# Patient Record
Sex: Female | Born: 1950 | Race: White | Hispanic: No | Smoking: Current every day smoker
Health system: Southern US, Community
[De-identification: ages and names within clinical notes are randomized; demographics above are authoritative.]

## PROBLEM LIST (undated history)

## (undated) DIAGNOSIS — E039 Hypothyroidism, unspecified: Secondary | ICD-10-CM

## (undated) DIAGNOSIS — E782 Mixed hyperlipidemia: Secondary | ICD-10-CM

## (undated) DIAGNOSIS — F419 Anxiety disorder, unspecified: Secondary | ICD-10-CM

## (undated) DIAGNOSIS — E6609 Other obesity due to excess calories: Secondary | ICD-10-CM

## (undated) DIAGNOSIS — F4321 Adjustment disorder with depressed mood: Secondary | ICD-10-CM

## (undated) DIAGNOSIS — F17210 Nicotine dependence, cigarettes, uncomplicated: Secondary | ICD-10-CM

## (undated) DIAGNOSIS — M5135 Other intervertebral disc degeneration, thoracolumbar region: Secondary | ICD-10-CM

## (undated) DIAGNOSIS — J449 Chronic obstructive pulmonary disease, unspecified: Secondary | ICD-10-CM

## (undated) DIAGNOSIS — J301 Allergic rhinitis due to pollen: Secondary | ICD-10-CM

## (undated) DIAGNOSIS — E559 Vitamin D deficiency, unspecified: Secondary | ICD-10-CM

## (undated) DIAGNOSIS — M5033 Other cervical disc degeneration, cervicothoracic region: Secondary | ICD-10-CM

## (undated) DIAGNOSIS — B351 Tinea unguium: Secondary | ICD-10-CM

## (undated) HISTORY — DX: Other intervertebral disc degeneration, thoracolumbar region: M51.35

## (undated) HISTORY — DX: Mixed hyperlipidemia: E78.2

## (undated) HISTORY — PX: CERVICAL DISC SURGERY: SHX588

## (undated) HISTORY — DX: Hypothyroidism, unspecified: E03.9

## (undated) HISTORY — DX: Other obesity due to excess calories: E66.09

## (undated) HISTORY — DX: Anxiety disorder, unspecified: F41.9

## (undated) HISTORY — DX: Allergic rhinitis due to pollen: J30.1

## (undated) HISTORY — DX: Adjustment disorder with depressed mood: F43.21

## (undated) HISTORY — DX: Vitamin D deficiency, unspecified: E55.9

## (undated) HISTORY — DX: Chronic obstructive pulmonary disease, unspecified: J44.9

## (undated) HISTORY — DX: Nicotine dependence, cigarettes, uncomplicated: F17.210

## (undated) HISTORY — DX: Other cervical disc degeneration, cervicothoracic region: M50.33

## (undated) HISTORY — DX: Tinea unguium: B35.1

## (undated) HISTORY — PX: BACK SURGERY: SHX140

---

## 2004-10-18 ENCOUNTER — Emergency Department (HOSPITAL_COMMUNITY): Admission: EM | Admit: 2004-10-18 | Discharge: 2004-10-18 | Payer: Self-pay | Admitting: Emergency Medicine

## 2006-05-09 HISTORY — PX: COLONOSCOPY: SHX174

## 2006-07-10 ENCOUNTER — Ambulatory Visit (HOSPITAL_COMMUNITY): Admission: RE | Admit: 2006-07-10 | Discharge: 2006-07-10 | Payer: Self-pay | Admitting: Family Medicine

## 2006-09-27 ENCOUNTER — Ambulatory Visit (HOSPITAL_COMMUNITY): Admission: RE | Admit: 2006-09-27 | Discharge: 2006-09-27 | Payer: Self-pay | Admitting: Family Medicine

## 2007-03-29 ENCOUNTER — Ambulatory Visit: Payer: Self-pay | Admitting: Gastroenterology

## 2007-04-12 ENCOUNTER — Ambulatory Visit: Payer: Self-pay | Admitting: Gastroenterology

## 2007-04-12 ENCOUNTER — Ambulatory Visit (HOSPITAL_COMMUNITY): Admission: RE | Admit: 2007-04-12 | Discharge: 2007-04-12 | Payer: Self-pay | Admitting: Gastroenterology

## 2007-05-07 ENCOUNTER — Emergency Department (HOSPITAL_COMMUNITY): Admission: EM | Admit: 2007-05-07 | Discharge: 2007-05-07 | Payer: Self-pay | Admitting: Family Medicine

## 2007-05-09 ENCOUNTER — Ambulatory Visit (HOSPITAL_COMMUNITY): Admission: RE | Admit: 2007-05-09 | Discharge: 2007-05-09 | Payer: Self-pay | Admitting: Preventative Medicine

## 2007-05-31 ENCOUNTER — Encounter: Admission: RE | Admit: 2007-05-31 | Discharge: 2007-05-31 | Payer: Self-pay | Admitting: Neurosurgery

## 2009-05-25 ENCOUNTER — Ambulatory Visit (HOSPITAL_COMMUNITY): Admission: RE | Admit: 2009-05-25 | Discharge: 2009-05-25 | Payer: Self-pay | Admitting: Family Medicine

## 2009-05-25 DIAGNOSIS — F17210 Nicotine dependence, cigarettes, uncomplicated: Secondary | ICD-10-CM | POA: Diagnosis present

## 2009-05-25 HISTORY — DX: Nicotine dependence, cigarettes, uncomplicated: F17.210

## 2009-09-08 DIAGNOSIS — J301 Allergic rhinitis due to pollen: Secondary | ICD-10-CM

## 2009-09-08 DIAGNOSIS — F4321 Adjustment disorder with depressed mood: Secondary | ICD-10-CM

## 2009-09-08 HISTORY — DX: Adjustment disorder with depressed mood: F43.21

## 2009-09-08 HISTORY — DX: Allergic rhinitis due to pollen: J30.1

## 2009-09-14 ENCOUNTER — Ambulatory Visit (HOSPITAL_COMMUNITY): Admission: RE | Admit: 2009-09-14 | Discharge: 2009-09-14 | Payer: Self-pay | Admitting: Family Medicine

## 2009-11-05 ENCOUNTER — Ambulatory Visit (HOSPITAL_COMMUNITY): Admission: RE | Admit: 2009-11-05 | Discharge: 2009-11-05 | Payer: Self-pay | Admitting: Neurosurgery

## 2009-12-28 DIAGNOSIS — E559 Vitamin D deficiency, unspecified: Secondary | ICD-10-CM

## 2009-12-28 HISTORY — DX: Vitamin D deficiency, unspecified: E55.9

## 2010-01-14 ENCOUNTER — Inpatient Hospital Stay (HOSPITAL_COMMUNITY): Admission: RE | Admit: 2010-01-14 | Discharge: 2010-01-17 | Payer: Self-pay | Admitting: Neurosurgery

## 2010-01-31 ENCOUNTER — Emergency Department (HOSPITAL_COMMUNITY): Admission: EM | Admit: 2010-01-31 | Discharge: 2010-01-31 | Payer: Self-pay | Admitting: Emergency Medicine

## 2010-07-22 LAB — CBC
HCT: 44.8 % (ref 36.0–46.0)
Hemoglobin: 15.5 g/dL — ABNORMAL HIGH (ref 12.0–15.0)
MCH: 31.1 pg (ref 26.0–34.0)
MCHC: 34.6 g/dL (ref 30.0–36.0)
MCV: 90 fL (ref 78.0–100.0)
RDW: 13 % (ref 11.5–15.5)

## 2010-07-22 LAB — ABO/RH: ABO/RH(D): A NEG

## 2010-07-22 LAB — BASIC METABOLIC PANEL
BUN: 9 mg/dL (ref 6–23)
CO2: 33 mEq/L — ABNORMAL HIGH (ref 19–32)
Calcium: 9.3 mg/dL (ref 8.4–10.5)
Chloride: 102 mEq/L (ref 96–112)
Creatinine, Ser: 0.99 mg/dL (ref 0.4–1.2)
Potassium: 4.4 mEq/L (ref 3.5–5.1)

## 2010-07-22 LAB — TYPE AND SCREEN: Antibody Screen: NEGATIVE

## 2010-07-23 ENCOUNTER — Encounter (HOSPITAL_COMMUNITY)
Admission: RE | Admit: 2010-07-23 | Discharge: 2010-07-23 | Disposition: A | Discharge status: Home / Self Care | Payer: BC Managed Care – PPO | Source: Ambulatory Visit | Attending: Neurosurgery | Admitting: Neurosurgery

## 2010-07-29 ENCOUNTER — Ambulatory Visit (HOSPITAL_COMMUNITY): Admission: RE | Admit: 2010-07-29 | Payer: BC Managed Care – PPO | Source: Ambulatory Visit | Admitting: Neurosurgery

## 2010-09-21 NOTE — Consult Note (Signed)
NAMEELISABELLA, Lori Lopez              ACCOUNT NO.:  [id number]   MEDICAL RECORD NO.:  [id number]          PATIENT TYPE:  AMB   LOCATION:  DAY                           FACILITY:  [facility]   PHYSICIAN:  Kassie Mends, M.D.      DATE OF BIRTH:  1950-11-20   DATE OF CONSULTATION:  DATE OF DISCHARGE:                                 CONSULTATION   REQUESTING PHYSICIAN:  Lori Lopez, M.D.   REASON FOR CONSULTATION:  Needs screening colonoscopy.   HPI:  Lori Lopez is a 60 year old Caucasian female who has never had  a colonoscopy.  She denies any GI complaints at this time except for a  bulge from her rectum with defecation has been present for the last 8  months.  She does have history of hemorrhoids and has had previous  treatment years ago.  She denies any rectal bleeding or melena.  Denies  any constipation or diarrhea.  Denies any abdominal pain.  Denies any  upper GI symptoms at this time.   PAST MEDICAL AND SURGICAL HISTORY:  1. Hemorrhoids.  2. Partial hysterectomy.  3. She had cholecystectomy in 2003 secondary to  cholelithiasis.  4. She has had cervical disk surgery.   CURRENT MEDICINES:  Alprazolam 0.5 mg p.r.n., calcium and vitamin D once  daily, vitamin E once daily.   ALLERGIES:  NO KNOWN DRUG ALLERGIES.   FAMILY HISTORY:  No known family history of colorectal carcinoma, liver  or chronic GI problems.  Mother age 57 and father age 21 are healthy.  She has 4 healthy siblings.   SOCIAL HISTORY:  Lori Lopez is married.  She has 3 healthy children.  She is employed full time.  She has a 40 pack-year history of tobacco  use.  No alcohol or drug use.   REVIEW OF SYSTEMS:  See HPI, otherwise negative.   PHYSICAL EXAM:  VITAL SIGNS: Weight 167 pounds, 5 feet 2 inches, temp 98  degrees, blood pressure 128/82 and pulse 80.  GENERAL: She is a well-developed, well-nourished Caucasian female in no  acute distress.  HEENT:  sclera anicteric.  Conjunctivae pink.   Oropharynx pink and moist  without any lesions.  NECK:  Supple with no masses or thyromegaly.  CHEST:  Heart regular rhythm, normal S1-S2, without murmurs, clicks,  rubs or gallops.  Lungs clear to auscultation bilaterally.  ABDOMEN: Protuberant with positive bowel sounds x4.  No bruits  auscultated.  Soft, nondistended.  She does have a palpable 2 x 3 cm  bulge in the right upper quadrant just below the right costal margin  suspicious for hernia.  Otherwise no palpable mass or  hepatosplenomegaly.  Exam is limited given the patient's body habitus.  RECTAL:  No external lesions visualized.  Good sphincter tone.  No  internal masses palpated.  Small amount of light brown stool was  obtained from the vault which was hemoccult negative.  EXTREMITIES:  Without clubbing or edema bilaterally.   ASSESSMENT:  Lori Lopez is a 60 year old Caucasian female who is  overdue for screening colonoscopy.  She was referred through the  courtesy  of Dr. Sudie Lopez.  She has a subjective bulge with defecation.  Rectal exam was benign today, does have previous history of hemorrhoidal  disease but denies any rectal bleeding or melena at this time.  She is  going to need screening colonoscopy.   PLAN:  Screening colonoscopy with Dr. Cira Servant in the near future.  I have  discussed the procedure including risks and benefits including but not  limited to bleeding, infection, perforation, and drug reaction.  She  agrees with this plan and consent will be obtained.   We would like to thank Dr. Sudie Lopez for allowing [place] to participate in  the care of Lori Lopez.      [name], N.P.      Kassie Mends, M.D.  Electronically Signed    KJ/MEDQ  D:  03/29/2007  T:  03/30/2007  Job:  [id number]   cc:   Lori Lopez, M.D.  Fax: [phone]

## 2010-09-21 NOTE — Op Note (Signed)
Lori Lopez, Lori Lopez              ACCOUNT NO.:  [id number]   MEDICAL RECORD NO.:  [id number]          PATIENT TYPE:  AMB   LOCATION:  DAY                           FACILITY:  [facility]   PHYSICIAN:  Kassie Mends, M.D.      DATE OF BIRTH:  04/15/1951   DATE OF PROCEDURE:  04/12/2007  DATE OF DISCHARGE:                               OPERATIVE REPORT   REFERRING PHYSICIAN:  John Giovanni, MD.   PROCEDURE:  Colonoscopy.   INDICATION FOR EXAM:  Ms. Mills is a 60 year old female who presents  for average risk colon cancer screening.   FINDINGS:  1. Normal colon without evidence of polyps, masses, inflammatory      changes, diverticula or AVMs.  2. Small internal hemorrhoids.  Otherwise normal retroflexed view of      the rectum.   RECOMMENDATIONS:  1. Screening colonoscopy in 10 years.  2. She should follow high fiber diet.  She is given handout on high-      fiber diet and hemorrhoids.   MEDICATIONS:  1. Demerol 50 mg IV.  2. Versed 4 mg IV.   PROCEDURE TECHNIQUE:  Physical exam was performed.  Informed consent was  obtained from the patient after explaining benefits, risks and  alternatives to procedure.  The patient was connected to the monitor and  placed in left lateral position.  Continuous oxygen was provided by  nasal cannula and IV medicine administered through an indwelling  cannula.  After administration of sedation and rectal exam, the  patient's rectum was intubated and the  scope was advanced under direct visualization to the cecum.  The scope  was removed slowly by carefully examining the color, texture, anatomy  and integrity of the mucosa on the way out.  The patient recovered in  endoscopy and discharged home in satisfactory condition.      Kassie Mends, M.D.  Electronically Signed     SM/MEDQ  D:  04/12/2007  T:  04/12/2007  Job:  [id number]   cc:   Mila Homer. Sudie Bailey, M.D.  Fax: [phone]

## 2010-10-28 ENCOUNTER — Ambulatory Visit (HOSPITAL_COMMUNITY)
Admission: RE | Admit: 2010-10-28 | Discharge: 2010-10-28 | Disposition: A | Discharge status: Home / Self Care | Payer: BC Managed Care – PPO | Source: Ambulatory Visit | Attending: Family Medicine | Admitting: Family Medicine

## 2010-10-28 ENCOUNTER — Other Ambulatory Visit (HOSPITAL_COMMUNITY): Payer: Self-pay | Admitting: Family Medicine

## 2010-10-28 DIAGNOSIS — R0781 Pleurodynia: Secondary | ICD-10-CM

## 2010-10-28 DIAGNOSIS — R0789 Other chest pain: Secondary | ICD-10-CM | POA: Insufficient documentation

## 2010-11-05 DIAGNOSIS — J449 Chronic obstructive pulmonary disease, unspecified: Secondary | ICD-10-CM | POA: Diagnosis present

## 2010-11-05 HISTORY — DX: Chronic obstructive pulmonary disease, unspecified: J44.9

## 2010-11-26 ENCOUNTER — Other Ambulatory Visit (HOSPITAL_COMMUNITY): Payer: Self-pay | Admitting: Family Medicine

## 2010-11-26 DIAGNOSIS — M792 Neuralgia and neuritis, unspecified: Secondary | ICD-10-CM

## 2010-11-29 ENCOUNTER — Other Ambulatory Visit (HOSPITAL_COMMUNITY): Payer: Self-pay | Admitting: Family Medicine

## 2010-11-29 ENCOUNTER — Ambulatory Visit (HOSPITAL_COMMUNITY)
Admission: RE | Admit: 2010-11-29 | Discharge: 2010-11-29 | Disposition: A | Discharge status: Home / Self Care | Payer: BC Managed Care – PPO | Source: Ambulatory Visit | Attending: Family Medicine | Admitting: Family Medicine

## 2010-11-29 DIAGNOSIS — R0789 Other chest pain: Secondary | ICD-10-CM | POA: Insufficient documentation

## 2010-11-29 DIAGNOSIS — M792 Neuralgia and neuritis, unspecified: Secondary | ICD-10-CM

## 2010-11-29 DIAGNOSIS — G548 Other nerve root and plexus disorders: Secondary | ICD-10-CM | POA: Insufficient documentation

## 2010-11-29 DIAGNOSIS — R918 Other nonspecific abnormal finding of lung field: Secondary | ICD-10-CM | POA: Insufficient documentation

## 2010-11-29 MED ORDER — IOHEXOL 300 MG/ML  SOLN
100.0000 mL | Freq: Once | INTRAMUSCULAR | Status: AC | PRN
  Administered 2010-11-29: 100 mL via INTRAVENOUS

## 2010-12-21 ENCOUNTER — Other Ambulatory Visit (HOSPITAL_COMMUNITY): Payer: Self-pay | Admitting: Family Medicine

## 2010-12-21 DIAGNOSIS — Z139 Encounter for screening, unspecified: Secondary | ICD-10-CM

## 2010-12-30 ENCOUNTER — Ambulatory Visit (HOSPITAL_COMMUNITY)
Admission: RE | Admit: 2010-12-30 | Discharge: 2010-12-30 | Disposition: A | Discharge status: Home / Self Care | Payer: BC Managed Care – PPO | Source: Ambulatory Visit | Attending: Family Medicine | Admitting: Family Medicine

## 2010-12-30 DIAGNOSIS — Z1231 Encounter for screening mammogram for malignant neoplasm of breast: Secondary | ICD-10-CM | POA: Insufficient documentation

## 2010-12-30 DIAGNOSIS — Z139 Encounter for screening, unspecified: Secondary | ICD-10-CM

## 2013-05-15 DIAGNOSIS — B351 Tinea unguium: Secondary | ICD-10-CM

## 2013-05-15 DIAGNOSIS — E6609 Other obesity due to excess calories: Secondary | ICD-10-CM

## 2013-05-15 DIAGNOSIS — E782 Mixed hyperlipidemia: Secondary | ICD-10-CM

## 2013-05-15 HISTORY — DX: Tinea unguium: B35.1

## 2013-05-15 HISTORY — DX: Mixed hyperlipidemia: E78.2

## 2013-05-15 HISTORY — DX: Other obesity due to excess calories: E66.09

## 2013-06-13 ENCOUNTER — Ambulatory Visit (HOSPITAL_COMMUNITY)
Admission: RE | Admit: 2013-06-13 | Discharge: 2013-06-13 | Disposition: A | Discharge status: Home / Self Care | Payer: Medicare Other | Source: Ambulatory Visit | Attending: Family Medicine | Admitting: Family Medicine

## 2013-06-13 ENCOUNTER — Other Ambulatory Visit (HOSPITAL_COMMUNITY): Payer: Self-pay | Admitting: Family Medicine

## 2013-06-13 DIAGNOSIS — Z981 Arthrodesis status: Secondary | ICD-10-CM | POA: Insufficient documentation

## 2013-06-13 DIAGNOSIS — R52 Pain, unspecified: Secondary | ICD-10-CM

## 2013-06-13 DIAGNOSIS — M47814 Spondylosis without myelopathy or radiculopathy, thoracic region: Secondary | ICD-10-CM | POA: Insufficient documentation

## 2013-06-13 DIAGNOSIS — M5135 Other intervertebral disc degeneration, thoracolumbar region: Secondary | ICD-10-CM

## 2013-06-13 DIAGNOSIS — M5033 Other cervical disc degeneration, cervicothoracic region: Secondary | ICD-10-CM

## 2013-06-13 HISTORY — DX: Other cervical disc degeneration, cervicothoracic region: M50.33

## 2013-06-13 HISTORY — DX: Other intervertebral disc degeneration, thoracolumbar region: M51.35

## 2014-09-08 ENCOUNTER — Other Ambulatory Visit (HOSPITAL_COMMUNITY): Payer: Self-pay | Admitting: Family Medicine

## 2014-09-08 DIAGNOSIS — Z1231 Encounter for screening mammogram for malignant neoplasm of breast: Secondary | ICD-10-CM

## 2014-09-10 ENCOUNTER — Ambulatory Visit (HOSPITAL_COMMUNITY): Payer: Medicare Other

## 2014-09-29 ENCOUNTER — Ambulatory Visit (HOSPITAL_COMMUNITY)
Admission: RE | Admit: 2014-09-29 | Discharge: 2014-09-29 | Disposition: A | Discharge status: Home / Self Care | Payer: PPO | Source: Ambulatory Visit | Attending: Family Medicine | Admitting: Family Medicine

## 2014-09-29 DIAGNOSIS — Z1231 Encounter for screening mammogram for malignant neoplasm of breast: Secondary | ICD-10-CM | POA: Insufficient documentation

## 2014-10-10 ENCOUNTER — Encounter: Payer: Self-pay | Admitting: *Deleted

## 2014-10-13 ENCOUNTER — Encounter: Payer: Self-pay | Admitting: Obstetrics & Gynecology

## 2014-10-13 ENCOUNTER — Ambulatory Visit (INDEPENDENT_AMBULATORY_CARE_PROVIDER_SITE_OTHER): Payer: PPO | Admitting: Obstetrics & Gynecology

## 2014-10-13 VITALS — BP 130/80 | HR 80 | Ht 62.0 in | Wt 168.0 lb

## 2014-10-13 DIAGNOSIS — N393 Stress incontinence (female) (male): Secondary | ICD-10-CM | POA: Diagnosis not present

## 2014-10-13 MED ORDER — ESTRADIOL 0.1 MG/GM VA CREA: 1.0000 | TOPICAL_CREAM | Freq: Every day | VAGINAL | Status: DC

## 2014-10-13 MED ORDER — IMIPRAMINE HCL 25 MG PO TABS: 25.0000 mg | ORAL_TABLET | Freq: Every day | ORAL | Status: DC

## 2014-10-13 NOTE — Progress Notes (Signed)
Patient ID: Lori Lopez, female   DOB: 08-Aug-1950, 64 y.o.   MRN: [id number]    Chief Complaint  Patient presents with  . Referral    stress incontinence.c/c hurting on rt side.     HPI:    64 y.o. No obstetric history on file. No LMP recorded. Patient is postmenopausal.  Referred for SUI  Loses urine with coughing laughing straining No loss with urgency or can't make it to the bathroom History is consistent with pure SUI smoker    Current outpatient prescriptions:  .  aspirin 81 MG tablet, Take 81 mg by mouth daily., Disp: , Rfl:  .  Cholecalciferol (VITAMIN D3) 400 UNITS CAPS, Take by mouth. Takes 2 daily., Disp: , Rfl:  .  clotrimazole-betamethasone (LOTRISONE) lotion, Apply topically 2 (two) times daily., Disp: , Rfl:  .  Ibuprofen 200 MG CAPS, Take by mouth., Disp: , Rfl:  .  ALPRAZolam (XANAX) 1 MG tablet, Take 1 mg by mouth at bedtime as needed for anxiety., Disp: , Rfl:  .  diphenhydrAMINE (BENADRYL) 25 MG tablet, Take 25 mg by mouth every 4 (four) hours as needed. , Disp: , Rfl:  .  estradiol (ESTRACE VAGINAL) 0.1 MG/GM vaginal cream, Place 1 Applicatorful vaginally at bedtime., Disp: 42.5 g, Rfl: 12 .  imipramine (TOFRANIL) 25 MG tablet, Take 1 tablet (25 mg total) by mouth at bedtime., Disp: 30 tablet, Rfl: 3 .  meclizine (ANTIVERT) 25 MG tablet, Take 25 mg by mouth every 6 (six) hours as needed. , Disp: , Rfl:   Problem Pertinent ROS:       No burning with urination, frequency or urgency No nausea, vomiting or diarrhea Nor fever chills or other constitutional symptoms   Extended ROS:      [place]:              Past Medical History  Diagnosis Date  . 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 Degeneration of thoracolumbar intervertebral disc 06/13/2013  . Obesity due to excess calories 05/15/2013  . Tinea unguium 05/15/2013  . Mixed hyperlipidemia 05/15/2013  . COPD (chronic obstructive pulmonary disease) 11/05/2010  . Anxiety disorder  10/25/2010  . Vitamin D deficiency 12/28/2009  . Abnormal bone density screening 09/24/2009  . Adjustment disorder with depressed mood 09/08/2009  . Cervicalgia 09/08/2009  . Pain in thoracic spine 09/08/2009  . Allergic rhinitis due to pollen 09/08/2009  . Cigarette nicotine dependence, uncomplicated 09/10/6977  . Chest discomfort 10/10/14  . Epigastric pain   . Hypothyroidism   . Anxiety     Past Surgical History  Procedure Laterality Date  . Back surgery      OB History    No data available      Allergies  Allergen Reactions  . Hydrocodone   . Lyrica [Pregabalin]   . Shrimp [Shellfish Allergy]     History   Social History  . Marital Status: Married    Spouse Name: N/A  . Number of Children: N/A  . Years of Education: N/A   Social History Main Topics  . Smoking status: Current Some Day Smoker    Types: Cigarettes  . Smokeless tobacco: Not on file     Comment: 2-3 weekly  . Alcohol Use: Not on file  . Drug Use: Not on file  . Sexual Activity: Not on file   Other Topics Concern  . None   Social History Narrative    Family History  Problem Relation Age of Onset  . Diabetes Mother   .  CAD Mother   . Heart disease Mother   . CAD Father      Examination:  Vitals:  Blood pressure 130/80, pulse 80, height 5\' 2"  (1.575 m), weight 168 lb (76.204 kg).    Physical Examination:     General Appearance:  awake, alert, oriented, in no acute distress  Vulva:  normal appearing vulva with no masses, tenderness or lesions Vagina:  normal mucosa, no discharge, minimal urethral angle change with straining, minimal anatomical change, minimal cystocoele Cervix:  absent Uterus:  uterus absent Adnexa: ovaries:,  normal adnexa in size, nontender and no masses     DATA orders and reviews: Labs were not ordered today:   Imaging studies were not ordered today:    Lab tests were not reviewed today:    Imaging studies were not reviewed today:    I did not independently  review/view images, tracing or specimen(not simply the report) myself.  Prescription Drug Management:  New Prescriptions: Estrace vaginal cream qohs, imipamine 25 mg nightly Renewed Prescriptions:   Current prescription changes:     Impression/Plan(Problem Based): 1.  SUI, pure      (new problem) : Additional workup is not needed:  meds as above    Follow Up:   3  months

## 2014-10-14 ENCOUNTER — Ambulatory Visit (INDEPENDENT_AMBULATORY_CARE_PROVIDER_SITE_OTHER): Payer: PPO | Admitting: Cardiovascular Disease

## 2014-10-14 ENCOUNTER — Encounter: Payer: Self-pay | Admitting: *Deleted

## 2014-10-14 ENCOUNTER — Encounter: Payer: Self-pay | Admitting: Cardiovascular Disease

## 2014-10-14 VITALS — BP 138/78 | HR 82 | Ht 62.0 in | Wt 167.0 lb

## 2014-10-14 DIAGNOSIS — Z8249 Family history of ischemic heart disease and other diseases of the circulatory system: Secondary | ICD-10-CM

## 2014-10-14 DIAGNOSIS — R079 Chest pain, unspecified: Secondary | ICD-10-CM

## 2014-10-14 DIAGNOSIS — Z716 Tobacco abuse counseling: Secondary | ICD-10-CM | POA: Diagnosis not present

## 2014-10-14 DIAGNOSIS — R0789 Other chest pain: Secondary | ICD-10-CM

## 2014-10-14 NOTE — Patient Instructions (Signed)
Your physician has requested that you have a lexiscan myoview. For further information please visit [email or web address]. Please follow instruction sheet, as given. Office will contact with results via phone or letter.   Continue all current medications. Follow up in  3-4 weeks

## 2014-10-14 NOTE — Progress Notes (Signed)
Patient ID: Lori Lopez, female   DOB: 08/03/1950, 64 y.o.   MRN: [id number]       CARDIOLOGY CONSULT NOTE  Patient ID: Lori Lopez MRN: [id number] DOB/AGE: Nov 11, 1950 64 y.o.  Admit date: (Not on file) Primary Physician Robert Bellow, MD  Reason for Consultation: chest pain  HPI: The patient is a 64 year old woman who has been referred for the evaluation of chest pain. She has a history of anterior cervical spine fusion, hyperlipidemia, COPD, anxiety, and tobacco use. She also has a history of hypothyroidism and was started on levothyroxine in mid May.   Labs performed on 09/15/14 showed total cholesterol 196, HDL 55, triglycerides 101, LDL 121, BUN 17, creatinine 0.97, potassium 4.9, sodium 142, TSH 5.48, hemoglobin 15.8, platelets 272.  ECG performed in the office today demonstrate normal sinus rhythm with no ischemic ST segment or T-wave abnormalities.  She describes problems occurring after a back surgery in 2011. She used to be very active but after her back surgery, she put on weight and has developed a host of back and leg cramps.   She started Synthroid on 5/13 and after taking 2 tablets, she developed a retrosternal chest tightness which radiated into her back. This was ongoing for 3 weeks and has only recently subsided. She had 2 days of chin pain. She denies exertional chest pain.   Soc: She has smoked since the age of 88 and her husband smokes. She has cut back her cigarette smoking to a few cigarettes daily.   Fam: She has a family history of coronary artery disease as her father had coronary stents at age 87. Her mother had a stent placed for coronary artery disease at the age of 58. She has an older sister who had a stent placed earlier this year.    Allergies  Allergen Reactions  . Hydrocodone   . Lyrica [Pregabalin]   . Shrimp [Shellfish Allergy]     Current Outpatient Prescriptions  Medication Sig Dispense Refill  . ALPRAZolam (XANAX) 1 MG  tablet Take 1 mg by mouth at bedtime as needed for anxiety.    Marland Kitchen aspirin 81 MG tablet Take 81 mg by mouth daily.    . Cholecalciferol (VITAMIN D3) 400 UNITS CAPS Take by mouth. Takes 2 daily.    . clotrimazole-betamethasone (LOTRISONE) lotion Apply topically 2 (two) times daily.    . diphenhydrAMINE (BENADRYL) 25 MG tablet Take 25 mg by mouth every 4 (four) hours as needed.     Marland Kitchen estradiol (ESTRACE VAGINAL) 0.1 MG/GM vaginal cream Place 1 Applicatorful vaginally at bedtime. 42.5 g 12  . Ibuprofen 200 MG CAPS Take by mouth.    . meclizine (ANTIVERT) 25 MG tablet Take 25 mg by mouth every 6 (six) hours as needed.      No current facility-administered medications for this visit.    Past Medical History  Diagnosis Date  . 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 Degeneration of thoracolumbar intervertebral disc 06/13/2013  . Obesity due to excess calories 05/15/2013  . Tinea unguium 05/15/2013  . Mixed hyperlipidemia 05/15/2013  . COPD (chronic obstructive pulmonary disease) 11/05/2010  . Anxiety disorder 10/25/2010  . Vitamin D deficiency 12/28/2009  . Abnormal bone density screening 09/24/2009  . Adjustment disorder with depressed mood 09/08/2009  . Cervicalgia 09/08/2009  . Pain in thoracic spine 09/08/2009  . Allergic rhinitis due to pollen 09/08/2009  . Cigarette nicotine dependence, uncomplicated 0/99/8338  . Chest discomfort 10/10/14  . Epigastric pain   .  Hypothyroidism   . Anxiety     Past Surgical History  Procedure Laterality Date  . Back surgery      History   Social History  . Marital Status: Married    Spouse Name: N/A  . Number of Children: N/A  . Years of Education: N/A   Occupational History  . Not on file.   Social History Main Topics  . Smoking status: Current Some Day Smoker    Types: Cigarettes  . Smokeless tobacco: Never Used     Comment: 2-3 weekly  . Alcohol Use: Not on file  . Drug Use: Not on file  . Sexual Activity: Not on file   Other  Topics Concern  . Not on file   Social History Narrative       Prior to Admission medications   Medication Sig Start Date End Date Taking? Authorizing Provider  ALPRAZolam Duanne Moron) 1 MG tablet Take 1 mg by mouth at bedtime as needed for anxiety.   Yes Historical Provider, MD  aspirin 81 MG tablet Take 81 mg by mouth daily.   Yes Historical Provider, MD  Cholecalciferol (VITAMIN D3) 400 UNITS CAPS Take by mouth. Takes 2 daily.   Yes Historical Provider, MD  clotrimazole-betamethasone (LOTRISONE) lotion Apply topically 2 (two) times daily.   Yes Historical Provider, MD  diphenhydrAMINE (BENADRYL) 25 MG tablet Take 25 mg by mouth every 4 (four) hours as needed.    Yes Historical Provider, MD  estradiol (ESTRACE VAGINAL) 0.1 MG/GM vaginal cream Place 1 Applicatorful vaginally at bedtime. 10/13/14  Yes Florian Buff, MD  Ibuprofen 200 MG CAPS Take by mouth.   Yes Historical Provider, MD  imipramine (TOFRANIL) 25 MG tablet Take 1 tablet (25 mg total) by mouth at bedtime. 10/13/14  Yes Florian Buff, MD  meclizine (ANTIVERT) 25 MG tablet Take 25 mg by mouth every 6 (six) hours as needed.    Yes Historical Provider, MD     Review of systems complete and found to be negative unless listed above in HPI     Physical exam Blood pressure 138/78, pulse 82, height 5\' 2"  (1.575 m), weight 167 lb (75.751 kg), SpO2 92 %. General: NAD Neck: No JVD, no thyromegaly or thyroid nodule.  Lungs: Faint, intermittent, expiratory wheezes/rhonchi with normal respiratory effort. CV: Nondisplaced PMI. Regular rate and rhythm, normal S1/S2, no S3/S4, no murmur.  No peripheral edema.  No carotid bruit.  Normal pedal pulses.  Abdomen: Soft, nontender, obese, no distention.  Skin: Intact without lesions or rashes.  Neurologic: Alert and oriented x 3.  Psych: Normal affect. Extremities: No clubbing or cyanosis.  HEENT: Normal.   ECG: Most recent ECG reviewed.  Labs:   Lab Results  Component Value Date   WBC  10.8* 01/07/2010   HGB 15.5* 01/07/2010   HCT 44.8 01/07/2010   MCV 90.0 01/07/2010   PLT 293 01/07/2010   No results for input(s): NA, K, CL, CO2, BUN, CREATININE, CALCIUM, PROT, BILITOT, ALKPHOS, ALT, AST, GLUCOSE in the last 168 hours.  Invalid input(s): LABALBU No results found for: CKTOTAL, CKMB, CKMBINDEX, TROPONINI No results found for: CHOL No results found for: HDL No results found for: LDLCALC No results found for: TRIG No results found for: CHOLHDL No results found for: LDLDIRECT       Studies: No results found.  ASSESSMENT AND PLAN:  1. Chest pain: Atypical features with CV risk factors including tobacco abuse and family history of premature CAD. Will proceed with a [place]  Cardiolite stress test for further clarification. On ASA 81 mg started by PCP.  2. Tobacco abuse: Cessation counseling provided.  Dispo: f/u 3-4 weeks.   Signed: Kate Sable, M.D., F.A.C.C.  10/14/2014, 2:40 PM

## 2014-10-22 ENCOUNTER — Inpatient Hospital Stay (HOSPITAL_COMMUNITY): Admission: RE | Admit: 2014-10-22 | Payer: PPO | Source: Ambulatory Visit

## 2014-10-22 ENCOUNTER — Encounter (HOSPITAL_COMMUNITY)
Admission: RE | Admit: 2014-10-22 | Discharge: 2014-10-22 | Disposition: A | Discharge status: Home / Self Care | Payer: PPO | Source: Ambulatory Visit | Attending: Cardiovascular Disease | Admitting: Cardiovascular Disease

## 2014-10-22 ENCOUNTER — Encounter (HOSPITAL_COMMUNITY): Payer: Self-pay

## 2014-10-22 DIAGNOSIS — Z8249 Family history of ischemic heart disease and other diseases of the circulatory system: Secondary | ICD-10-CM | POA: Insufficient documentation

## 2014-10-22 DIAGNOSIS — R079 Chest pain, unspecified: Secondary | ICD-10-CM

## 2014-10-22 MED ORDER — SODIUM CHLORIDE 0.9 % IJ SOLN
10.0000 mL | INTRAMUSCULAR | Status: DC | PRN
  Administered 2014-10-22: 10 mL via INTRAVENOUS
  Filled 2014-10-22: qty 10

## 2014-10-22 MED ORDER — REGADENOSON 0.4 MG/5ML IV SOLN
INTRAVENOUS | Status: AC
  Administered 2014-10-22: 0.4 mg via INTRAVENOUS
  Filled 2014-10-22: qty 5

## 2014-10-22 MED ORDER — SODIUM CHLORIDE 0.9 % IJ SOLN
INTRAMUSCULAR | Status: AC
  Administered 2014-10-22: 10 mL via INTRAVENOUS
  Filled 2014-10-22: qty 3

## 2014-10-22 MED ORDER — TECHNETIUM TC 99M SESTAMIBI GENERIC - CARDIOLITE
10.0000 | Freq: Once | INTRAVENOUS | Status: AC | PRN
  Administered 2014-10-22: 10 via INTRAVENOUS

## 2014-10-22 MED ORDER — TECHNETIUM TC 99M SESTAMIBI - CARDIOLITE
30.0000 | Freq: Once | INTRAVENOUS | Status: AC | PRN
  Administered 2014-10-22: 10:00:00 30 via INTRAVENOUS

## 2014-10-22 MED ORDER — REGADENOSON 0.4 MG/5ML IV SOLN
0.4000 mg | Freq: Once | INTRAVENOUS | Status: AC | PRN
  Administered 2014-10-22: 0.4 mg via INTRAVENOUS
  Filled 2014-10-22: qty 5

## 2014-10-23 LAB — NM MYOCAR MULTI W/SPECT W/WALL MOTION / EF
CHL CUP NUCLEAR SDS: 5
CHL CUP NUCLEAR SRS: 5
CSEPPHR: 112 {beats}/min
LV dias vol: 51 mL
LV sys vol: 12 mL
NUC STRESS EF: 76 %
Rest HR: 73 {beats}/min
SSS: 10
TID: 1.17

## 2014-10-27 ENCOUNTER — Telehealth: Payer: Self-pay | Admitting: *Deleted

## 2014-10-27 NOTE — Telephone Encounter (Signed)
Notes Recorded by Laurine Blazer, LPN on 3/55/9741 at 6:38 PM Patient notified via voice mail. Follow up OV scheduled for 11/04/14 with Dr. Bronson Ing.  Notes Recorded by Herminio Commons, MD on 10/27/2014 at 10:58 AM Low risk. Continue medical therapy.  Notes Recorded by Lendon Colonel, NP on 10/23/2014 at 12:10 PM Low risk stress test. No changes in regimen

## 2014-11-04 ENCOUNTER — Ambulatory Visit (INDEPENDENT_AMBULATORY_CARE_PROVIDER_SITE_OTHER): Payer: PPO | Admitting: Cardiovascular Disease

## 2014-11-04 ENCOUNTER — Encounter: Payer: Self-pay | Admitting: Cardiovascular Disease

## 2014-11-04 VITALS — BP 138/76 | HR 77 | Ht 62.0 in | Wt 167.8 lb

## 2014-11-04 DIAGNOSIS — R0789 Other chest pain: Secondary | ICD-10-CM | POA: Diagnosis not present

## 2014-11-04 DIAGNOSIS — Z716 Tobacco abuse counseling: Secondary | ICD-10-CM | POA: Diagnosis not present

## 2014-11-04 DIAGNOSIS — Z8249 Family history of ischemic heart disease and other diseases of the circulatory system: Secondary | ICD-10-CM

## 2014-11-04 MED ORDER — NITROGLYCERIN 0.4 MG SL SUBL: 0.4000 mg | SUBLINGUAL_TABLET | SUBLINGUAL | Status: DC | PRN

## 2014-11-04 NOTE — Progress Notes (Signed)
Patient ID: ELISKA HAMIL, female   DOB: 1950-09-04, 64 y.o.   MRN: [id number]      SUBJECTIVE: The patient returns for follow-up after undergoing cardiovascular testing performed for the evaluation of chest pain. She underwent a low risk nuclear stress test which demonstrated a small, partially reversible defect of mild severity in the apical anterior location which was more suggestive of variable soft tissue attenuation rather than ischemia.  She has had 2 episodes of retrosternal chest pains which lasted only seconds and were not associated with shortness of breath or lightheadedness since her last visit with me. She thinks it may have been due to something she ate.  Sister: Lamont Snowball who has CAD and stent, and is also my patient.  Review of Systems: As per "subjective", otherwise negative.  Allergies  Allergen Reactions  . Hydrocodone   . Lyrica [Pregabalin]   . Shrimp [Shellfish Allergy]     Current Outpatient Prescriptions  Medication Sig Dispense Refill  . ALPRAZolam (XANAX) 1 MG tablet Take 1 mg by mouth at bedtime as needed for anxiety.    Marland Kitchen aspirin 81 MG tablet Take 81 mg by mouth daily.    . Cholecalciferol (VITAMIN D3) 400 UNITS CAPS Take by mouth. Takes 2 daily.    . clotrimazole-betamethasone (LOTRISONE) lotion Apply topically 2 (two) times daily.    . diphenhydrAMINE (BENADRYL) 25 MG tablet Take 25 mg by mouth every 4 (four) hours as needed.     Marland Kitchen estradiol (ESTRACE VAGINAL) 0.1 MG/GM vaginal cream Place 1 Applicatorful vaginally at bedtime. 42.5 g 12  . Ibuprofen 200 MG CAPS Take by mouth.    . meclizine (ANTIVERT) 25 MG tablet Take 25 mg by mouth every 6 (six) hours as needed.      No current facility-administered medications for this visit.    Past Medical History  Diagnosis Date  . 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 Degeneration of thoracolumbar intervertebral disc 06/13/2013  . Obesity due to excess calories 05/15/2013  .  Tinea unguium 05/15/2013  . Mixed hyperlipidemia 05/15/2013  . COPD (chronic obstructive pulmonary disease) 11/05/2010  . Anxiety disorder 10/25/2010  . Vitamin D deficiency 12/28/2009  . Abnormal bone density screening 09/24/2009  . Adjustment disorder with depressed mood 09/08/2009  . Cervicalgia 09/08/2009  . Pain in thoracic spine 09/08/2009  . Allergic rhinitis due to pollen 09/08/2009  . Cigarette nicotine dependence, uncomplicated 2/35/5732  . Chest discomfort 10/10/14  . Epigastric pain   . Hypothyroidism   . Anxiety     Past Surgical History  Procedure Laterality Date  . Back surgery      History   Social History  . Marital Status: Married    Spouse Name: N/A  . Number of Children: N/A  . Years of Education: N/A   Occupational History  . Not on file.   Social History Main Topics  . Smoking status: Current Some Day Smoker    Types: Cigarettes  . Smokeless tobacco: Never Used     Comment: 2-3 weekly  . Alcohol Use: Not on file  . Drug Use: Not on file  . Sexual Activity: Not on file   Other Topics Concern  . Not on file   Social History Narrative     Filed Vitals:   11/04/14 1120  BP: 138/76  Pulse: 77  Height: 5\' 2"  (1.575 m)  Weight: 167 lb 12.8 oz (76.114 kg)  SpO2: 92%    PHYSICAL EXAM General: NAD Neck: No JVD, no  thyromegaly or thyroid nodule.  Lungs: Faint, intermittent, expiratory wheezes/rhonchi with normal respiratory effort. CV: Nondisplaced PMI. Regular rate and rhythm, normal S1/S2, no S3/S4, no murmur. No peripheral edema. No carotid bruit. Normal pedal pulses.  Abdomen: Soft, nontender, obese, no distention.  Skin: Intact without lesions or rashes.  Neurologic: Alert and oriented x 3.  Psych: Normal affect. Extremities: No clubbing or cyanosis.  HEENT: Normal.   ECG: Most recent ECG reviewed.      ASSESSMENT AND PLAN: 1. Chest pain: Atypical features with CV risk factors including tobacco abuse and family history of premature  CAD. Given low risk Lexiscan Cardiolite stress test, no further testing indicated. On ASA 81 mg started by PCP. Will prescribe SL nitroglycerin prn.  2. Tobacco abuse: Cessation counseling provided.  Dispo: f/u October 2016.  Kate Sable, M.D., F.A.C.C.

## 2014-11-04 NOTE — Patient Instructions (Signed)
Your physician wants you to follow-up in: [place] DR. Bronson Ing You will receive a reminder letter in the mail two months in advance. If you don't receive a letter, please call our office to schedule the follow-up appointment.  Your physician recommends that you continue on your current medications as directed. Please refer to the Current Medication list given to you today.  WE HAVE SENT NITROGLYCERIN TO YOUR PHARMACY   Thank you for choosing [facility]!!  Nitroglycerin sublingual tablets What is this medicine? NITROGLYCERIN (nye troe GLI ser in) is a type of vasodilator. It relaxes blood vessels, increasing the blood and oxygen supply to your heart. This medicine is used to relieve chest pain caused by angina. It is also used to prevent chest pain before activities like climbing stairs, going outdoors in cold weather, or sexual activity. This medicine may be used for other purposes; ask your health care provider or pharmacist if you have questions. COMMON BRAND NAME(S): Nitroquick, Nitrostat, Nitrotab What should I tell my health care provider before I take this medicine? They need to know if you have any of these conditions: -anemia -head injury, recent stroke, or bleeding in the brain -liver disease -previous heart attack -an unusual or allergic reaction to nitroglycerin, other medicines, foods, dyes, or preservatives -pregnant or trying to get pregnant -breast-feeding How should I use this medicine? Take this medicine by mouth as needed. At the first sign of an angina attack (chest pain or tightness) place one tablet under your tongue. You can also take this medicine 5 to 10 minutes before an event likely to produce chest pain. Follow the directions on the prescription label. Let the tablet dissolve under the tongue. Do not swallow whole. Replace the dose if you accidentally swallow it. It will help if your mouth is not dry. Saliva around the tablet will help it to  dissolve more quickly. Do not eat or drink, smoke or chew tobacco while a tablet is dissolving. If you are not better within 5 minutes after taking ONE dose of nitroglycerin, call 9-1-1 immediately to seek emergency medical care. Do not take more than 3 nitroglycerin tablets over 15 minutes. If you take this medicine often to relieve symptoms of angina, your doctor or health care professional may provide you with different instructions to manage your symptoms. If symptoms do not go away after following these instructions, it is important to call 9-1-1 immediately. Do not take more than 3 nitroglycerin tablets over 15 minutes. Talk to your pediatrician regarding the use of this medicine in children. Special care may be needed. Overdosage: If you think you have taken too much of this medicine contact a poison control center or emergency room at once. NOTE: This medicine is only for you. Do not share this medicine with others. What if I miss a dose? This does not apply. This medicine is only used as needed. What may interact with this medicine? Do not take this medicine with any of the following medications: -certain migraine medicines like ergotamine and dihydroergotamine (DHE) -medicines used to treat erectile dysfunction like sildenafil, tadalafil, and vardenafil -riociguat This medicine may also interact with the following medications: -alteplase -aspirin -heparin -medicines for high blood pressure -medicines for mental depression -other medicines used to treat angina -phenothiazines like chlorpromazine, mesoridazine, prochlorperazine, thioridazine This list may not describe all possible interactions. Give your health care provider a list of all the medicines, herbs, non-prescription drugs, or dietary supplements you use. Also tell them if you smoke,  drink alcohol, or use illegal drugs. Some items may interact with your medicine. What should I watch for while using this medicine? Tell your  doctor or health care professional if you feel your medicine is no longer working. Keep this medicine with you at all times. Sit or lie down when you take your medicine to prevent falling if you feel dizzy or faint after using it. Try to remain calm. This will help you to feel better faster. If you feel dizzy, take several deep breaths and lie down with your feet propped up, or bend forward with your head resting between your knees. You may get drowsy or dizzy. Do not drive, use machinery, or do anything that needs mental alertness until you know how this drug affects you. Do not stand or sit up quickly, especially if you are an older patient. This reduces the risk of dizzy or fainting spells. Alcohol can make you more drowsy and dizzy. Avoid alcoholic drinks. Do not treat yourself for coughs, colds, or pain while you are taking this medicine without asking your doctor or health care professional for advice. Some ingredients may increase your blood pressure. What side effects may I notice from receiving this medicine? Side effects that you should report to your doctor or health care professional as soon as possible: -blurred vision -dry mouth -skin rash -sweating -the feeling of extreme pressure in the head -unusually weak or tired Side effects that usually do not require medical attention (report to your doctor or health care professional if they continue or are bothersome): -flushing of the face or neck -headache -irregular heartbeat, palpitations -nausea, vomiting This list may not describe all possible side effects. Call your doctor for medical advice about side effects. You may report side effects to FDA at 1-800-FDA-1088. Where should I keep my medicine? Keep out of the reach of children. Store at room temperature between 20 and 25 degrees C (68 and 77 degrees F). Store in [facility]. Protect from light and moisture. Keep tightly closed. Throw away any unused medicine after the  expiration date. NOTE: This sheet is a summary. It may not cover all possible information. If you have questions about this medicine, talk to your doctor, pharmacist, or health care provider.  2015, [employer]vier/Gold Standard. (2013-02-21 17:57:36)

## 2015-01-13 ENCOUNTER — Ambulatory Visit: Payer: PPO | Admitting: Obstetrics & Gynecology

## 2015-03-17 ENCOUNTER — Encounter: Payer: Self-pay | Admitting: Cardiovascular Disease

## 2015-03-17 ENCOUNTER — Ambulatory Visit (INDEPENDENT_AMBULATORY_CARE_PROVIDER_SITE_OTHER): Payer: PPO | Admitting: Cardiovascular Disease

## 2015-03-17 VITALS — BP 122/72 | HR 81 | Ht 62.0 in | Wt 166.0 lb

## 2015-03-17 DIAGNOSIS — R0789 Other chest pain: Secondary | ICD-10-CM | POA: Diagnosis not present

## 2015-03-17 DIAGNOSIS — I739 Peripheral vascular disease, unspecified: Secondary | ICD-10-CM | POA: Diagnosis not present

## 2015-03-17 DIAGNOSIS — Z8249 Family history of ischemic heart disease and other diseases of the circulatory system: Secondary | ICD-10-CM | POA: Diagnosis not present

## 2015-03-17 DIAGNOSIS — Z720 Tobacco use: Secondary | ICD-10-CM | POA: Diagnosis not present

## 2015-03-17 NOTE — Patient Instructions (Signed)
Your physician has requested that you have an ankle brachial index (ABI). During this test an ultrasound and blood pressure cuff are used to evaluate the arteries that supply the arms and legs with blood. Allow thirty minutes for this exam. There are no restrictions or special instructions. Office will contact with results via phone or letter.   Continue all current medications. Your physician wants you to follow up in: 6 months.  You will receive a reminder letter in the mail one-two months in advance.  If you don't receive a letter, please call our office to schedule the follow up appointment    

## 2015-03-17 NOTE — Progress Notes (Signed)
Patient ID: Lori Lopez, female   DOB: Jul 13, 1950, 64 y.o.   MRN: [id number]      SUBJECTIVE: The patient returns for follow-up of chest pain. She underwent a low risk nuclear stress test on 10/23/14 which demonstrated a small, partially reversible defect of mild severity in the apical anterior location which was more suggestive of variable soft tissue attenuation rather than ischemia.  She has had no further episodes of chest pain. She has had bilateral thigh/quadriceps pain with walking and relieved with rest. She has smoked since age 12.  Sister: Lamont Snowball who has CAD and stent, and is also my patient.   Review of Systems: As per "subjective", otherwise negative.  Allergies  Allergen Reactions  . Hydrocodone   . Lyrica [Pregabalin]   . Shrimp [Shellfish Allergy]     Current Outpatient Prescriptions  Medication Sig Dispense Refill  . ALPRAZolam (XANAX) 1 MG tablet Take 1 mg by mouth at bedtime as needed for anxiety.    Marland Kitchen aspirin 81 MG tablet Take 81 mg by mouth daily.    . Cholecalciferol (VITAMIN D3) 400 UNITS CAPS Take by mouth. Takes 2 daily.    . clotrimazole-betamethasone (LOTRISONE) lotion Apply topically 2 (two) times daily.    . diphenhydrAMINE (BENADRYL) 25 MG tablet Take 25 mg by mouth every 4 (four) hours as needed.     Marland Kitchen estradiol (ESTRACE VAGINAL) 0.1 MG/GM vaginal cream Place 1 Applicatorful vaginally at bedtime. 42.5 g 12  . Ibuprofen 200 MG CAPS Take by mouth.    . meclizine (ANTIVERT) 25 MG tablet Take 25 mg by mouth every 6 (six) hours as needed.     . nitroGLYCERIN (NITROSTAT) 0.4 MG SL tablet Place 1 tablet (0.4 mg total) under the tongue every 5 (five) minutes as needed for chest pain. 25 tablet 3   No current facility-administered medications for this visit.    Past Medical History  Diagnosis Date  . 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 Degeneration of thoracolumbar intervertebral disc 06/13/2013  . Obesity due to  excess calories 05/15/2013  . Tinea unguium 05/15/2013  . Mixed hyperlipidemia 05/15/2013  . COPD (chronic obstructive pulmonary disease) ([place]) 11/05/2010  . Anxiety disorder 10/25/2010  . Vitamin D deficiency 12/28/2009  . Abnormal bone density screening 09/24/2009  . Adjustment disorder with depressed mood 09/08/2009  . Cervicalgia 09/08/2009  . Pain in thoracic spine 09/08/2009  . Allergic rhinitis due to pollen 09/08/2009  . Cigarette nicotine dependence, uncomplicated 10/26/5091  . Chest discomfort 10/10/14  . Epigastric pain   . Hypothyroidism   . Anxiety     Past Surgical History  Procedure Laterality Date  . Back surgery      Social History   Social History  . Marital Status: Married    Spouse Name: N/A  . Number of Children: N/A  . Years of Education: N/A   Occupational History  . Not on file.   Social History Main Topics  . Smoking status: Current Some Day Smoker    Types: Cigarettes  . Smokeless tobacco: Never Used     Comment: 2-3 weekly  . Alcohol Use: Not on file  . Drug Use: Not on file  . Sexual Activity: Not on file   Other Topics Concern  . Not on file   Social History Narrative     Filed Vitals:   03/17/15 1258  BP: 122/72  Pulse: 81  Height: 5\' 2"  (1.575 m)  Weight: 166 lb (75.297 kg)  SpO2: 92%  PHYSICAL EXAM General: NAD HEENT: Normal. Neck: No JVD, no thyromegaly. Lungs: Diminished throughout, no rales or wheezes.  CV: Nondisplaced PMI.  Regular rate and rhythm, normal S1/S2, no S3/S4, no murmur. No pretibial or periankle edema.    Abdomen: Soft, nontender, obese, no distention.   Neurologic: Alert and oriented x 3.  Psych: Normal affect. Skin: Normal. Musculoskeletal: Normal range of motion, no gross deformities. Extremities: No clubbing or cyanosis.   ECG: Most recent ECG reviewed.      ASSESSMENT AND PLAN: 1. Chest pain: No recurrences. Prior episodes had atypical features with CV risk factors including tobacco abuse and family  history of premature CAD. Given low risk Lexiscan Cardiolite stress test, no further testing indicated. On ASA 81 mg started by PCP. Continue SL nitroglycerin prn.  2. Tobacco abuse: Cessation counseling previously provided.  3. Bilateral quadriceps pain: High risk for PAD given long h/o tobacco abuse. Obtain ABI's.  Dispo: f/u 6 months.   Kate Sable, M.D., F.A.C.C.

## 2015-04-07 ENCOUNTER — Other Ambulatory Visit: Payer: Self-pay | Admitting: Cardiovascular Disease

## 2015-04-07 DIAGNOSIS — Z720 Tobacco use: Secondary | ICD-10-CM

## 2015-04-07 DIAGNOSIS — I739 Peripheral vascular disease, unspecified: Secondary | ICD-10-CM

## 2015-04-08 ENCOUNTER — Ambulatory Visit: Payer: PPO

## 2015-04-08 DIAGNOSIS — I739 Peripheral vascular disease, unspecified: Secondary | ICD-10-CM | POA: Diagnosis not present

## 2015-04-08 DIAGNOSIS — Z720 Tobacco use: Secondary | ICD-10-CM

## 2015-04-14 ENCOUNTER — Encounter: Payer: Self-pay | Admitting: *Deleted

## 2015-06-17 DIAGNOSIS — F17210 Nicotine dependence, cigarettes, uncomplicated: Secondary | ICD-10-CM | POA: Diagnosis not present

## 2015-06-17 DIAGNOSIS — E6609 Other obesity due to excess calories: Secondary | ICD-10-CM | POA: Diagnosis not present

## 2015-06-17 DIAGNOSIS — F419 Anxiety disorder, unspecified: Secondary | ICD-10-CM | POA: Diagnosis not present

## 2015-06-17 DIAGNOSIS — J3089 Other allergic rhinitis: Secondary | ICD-10-CM | POA: Diagnosis not present

## 2015-10-12 ENCOUNTER — Other Ambulatory Visit (HOSPITAL_COMMUNITY): Payer: Self-pay | Admitting: Family Medicine

## 2015-10-12 ENCOUNTER — Ambulatory Visit (HOSPITAL_COMMUNITY)
Admission: RE | Admit: 2015-10-12 | Discharge: 2015-10-12 | Disposition: A | Discharge status: Home / Self Care | Payer: PPO | Source: Ambulatory Visit | Attending: Family Medicine | Admitting: Family Medicine

## 2015-10-12 DIAGNOSIS — E6609 Other obesity due to excess calories: Secondary | ICD-10-CM | POA: Diagnosis not present

## 2015-10-12 DIAGNOSIS — R5383 Other fatigue: Secondary | ICD-10-CM | POA: Diagnosis not present

## 2015-10-12 DIAGNOSIS — J449 Chronic obstructive pulmonary disease, unspecified: Secondary | ICD-10-CM

## 2015-10-12 DIAGNOSIS — F419 Anxiety disorder, unspecified: Secondary | ICD-10-CM | POA: Diagnosis not present

## 2015-10-12 DIAGNOSIS — E039 Hypothyroidism, unspecified: Secondary | ICD-10-CM | POA: Diagnosis not present

## 2015-12-08 IMAGING — NM NM MYOCAR MULTI W/SPECT W/WALL MOTION & EF
2 series · 12 of 12 positions shown · non-contrast
Comparison: none

[Series 1: rest · 8.28mm/px · 6 of 64 frames shown]
[frame 6/64]
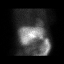
[frame 16/64]
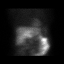
[frame 27/64]
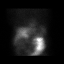
[frame 38/64]
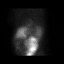
[frame 48/64]
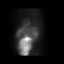
[frame 59/64]
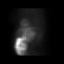

[Series 2: stress gated - perfusion · 8.28mm/px · 6 of 64 frames shown]
[frame 6/64]
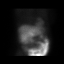
[frame 16/64]
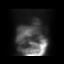
[frame 27/64]
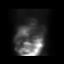
[frame 38/64]
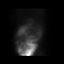
[frame 48/64]
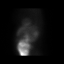
[frame 59/64]
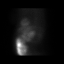

[12 of 12 positions shown; findings below may reference images not displayed]

Canned report from images found in remote index.

Refer to host system for actual result text.

## 2015-12-24 ENCOUNTER — Emergency Department (HOSPITAL_COMMUNITY)
Admission: EM | Admit: 2015-12-24 | Discharge: 2015-12-24 | Disposition: A | Discharge status: Home / Self Care | Payer: PPO | Attending: Emergency Medicine | Admitting: Emergency Medicine

## 2015-12-24 ENCOUNTER — Encounter (HOSPITAL_COMMUNITY): Payer: Self-pay | Admitting: Emergency Medicine

## 2015-12-24 DIAGNOSIS — F17210 Nicotine dependence, cigarettes, uncomplicated: Secondary | ICD-10-CM | POA: Diagnosis not present

## 2015-12-24 DIAGNOSIS — M549 Dorsalgia, unspecified: Secondary | ICD-10-CM | POA: Diagnosis not present

## 2015-12-24 DIAGNOSIS — Z79899 Other long term (current) drug therapy: Secondary | ICD-10-CM | POA: Insufficient documentation

## 2015-12-24 DIAGNOSIS — B029 Zoster without complications: Secondary | ICD-10-CM | POA: Diagnosis not present

## 2015-12-24 DIAGNOSIS — Z7982 Long term (current) use of aspirin: Secondary | ICD-10-CM | POA: Insufficient documentation

## 2015-12-24 DIAGNOSIS — J449 Chronic obstructive pulmonary disease, unspecified: Secondary | ICD-10-CM | POA: Insufficient documentation

## 2015-12-24 DIAGNOSIS — E039 Hypothyroidism, unspecified: Secondary | ICD-10-CM | POA: Diagnosis not present

## 2015-12-24 MED ORDER — PREDNISONE 10 MG PO TABS: ORAL_TABLET | ORAL | 0 refills | Status: DC

## 2015-12-24 MED ORDER — VALACYCLOVIR HCL 500 MG PO TABS
1000.0000 mg | ORAL_TABLET | Freq: Once | ORAL | Status: AC
  Administered 2015-12-24: 1000 mg via ORAL
  Filled 2015-12-24: qty 2

## 2015-12-24 MED ORDER — ACETAMINOPHEN-CODEINE #3 300-30 MG PO TABS: 1.0000 | ORAL_TABLET | Freq: Four times a day (QID) | ORAL | 0 refills | Status: DC | PRN

## 2015-12-24 MED ORDER — VALACYCLOVIR HCL 1 G PO TABS: 1000.0000 mg | ORAL_TABLET | Freq: Three times a day (TID) | ORAL | 0 refills | Status: AC

## 2015-12-24 MED ORDER — ACETAMINOPHEN-CODEINE #3 300-30 MG PO TABS
1.0000 | ORAL_TABLET | Freq: Once | ORAL | Status: AC
  Administered 2015-12-24: 1 via ORAL
  Filled 2015-12-24: qty 1

## 2015-12-24 MED ORDER — PREDNISONE 50 MG PO TABS
60.0000 mg | ORAL_TABLET | Freq: Once | ORAL | Status: AC
  Administered 2015-12-24: 60 mg via ORAL
  Filled 2015-12-24: qty 1

## 2015-12-24 NOTE — ED Provider Notes (Signed)
[facility] DEPT Provider Note   CSN: [id number] Arrival date & time: 12/24/15  0902     History   Chief Complaint Chief Complaint  Patient presents with  . Back Pain    HPI Lori Lopez is a 65 y.o. female presenting with a 3 day history of right sided flank pain, describing exquisite tenderness, even with light touch to her skin at this site.  She denies history of significant back pain, does have a prior history of lumbar surgery but this has been stable.  She denies fevers, chills, rash,  dysuria, urinary frequency or hematuria. She has found no alleviators.  The history is provided by the patient.    Past Medical History:  Diagnosis Date  . Abnormal bone density screening 09/24/2009  . Adjustment disorder with depressed mood 09/08/2009  . Allergic rhinitis due to pollen 09/08/2009  . Anxiety   . Anxiety disorder 10/25/2010  . Cervicalgia 09/08/2009  . Chest discomfort 10/10/14  . Cigarette nicotine dependence, uncomplicated [id number]  . COPD (chronic obstructive pulmonary disease) ([place]) 11/05/2010  . Degeneration of thoracolumbar intervertebral disc 06/13/2013  . 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 Epigastric pain   . Hypothyroidism   . Mixed hyperlipidemia 05/15/2013  . Obesity due to excess calories 05/15/2013  . Pain in thoracic spine 09/08/2009  . Tinea unguium 05/15/2013  . Vitamin D deficiency 12/28/2009    Patient Active Problem List   Diagnosis Date Noted  . SUI (stress urinary incontinence, female) 10/13/2014    Past Surgical History:  Procedure Laterality Date  . BACK SURGERY      OB History    No data available       Home Medications    Prior to Admission medications   Medication Sig Start Date End Date Taking? Authorizing Provider  acetaminophen-codeine (TYLENOL #3) 300-30 MG tablet Take 1-2 tablets by mouth every 6 (six) hours as needed for moderate pain. 12/24/15   Evalee Jefferson, PA-C  ALPRAZolam Duanne Moron) 1 MG tablet Take 1 mg  by mouth at bedtime as needed for anxiety.    Historical Provider, MD  aspirin 81 MG tablet Take 81 mg by mouth daily.    Historical Provider, MD  Cholecalciferol (VITAMIN D3) 400 UNITS CAPS Take by mouth. Takes 2 daily.    Historical Provider, MD  clotrimazole-betamethasone (LOTRISONE) lotion Apply topically 2 (two) times daily.    Historical Provider, MD  diphenhydrAMINE (BENADRYL) 25 MG tablet Take 25 mg by mouth every 4 (four) hours as needed.     Historical Provider, MD  estradiol (ESTRACE VAGINAL) 0.1 MG/GM vaginal cream Place 1 Applicatorful vaginally at bedtime. 10/13/14   Florian Buff, MD  Ibuprofen 200 MG CAPS Take by mouth.    Historical Provider, MD  meclizine (ANTIVERT) 25 MG tablet Take 25 mg by mouth every 6 (six) hours as needed.     Historical Provider, MD  nitroGLYCERIN (NITROSTAT) 0.4 MG SL tablet Place 1 tablet (0.4 mg total) under the tongue every 5 (five) minutes as needed for chest pain. 11/04/14   Herminio Commons, MD  predniSONE (DELTASONE) 10 MG tablet 6, 5, 4, 3, 2 then 1 tablet by mouth daily for 6 days total. 12/24/15   Evalee Jefferson, PA-C  valACYclovir (VALTREX) 1000 MG tablet Take 1 tablet (1,000 mg total) by mouth 3 (three) times daily. 12/24/15 12/31/15  Evalee Jefferson, PA-C    Family History Family History  Problem Relation Age of Onset  . Diabetes Mother   . CAD  Mother   . Heart disease Mother   . CAD Father     Social History Social History  Substance Use Topics  . Smoking status: Current Some Day Smoker    Types: Cigarettes  . Smokeless tobacco: Never Used     Comment: 2-3 weekly  . Alcohol use No     Allergies   Hydrocodone; Lyrica [pregabalin]; and Shrimp [shellfish allergy]   Review of Systems Review of Systems  Constitutional: Negative for fever.  Respiratory: Negative for shortness of breath.   Cardiovascular: Negative for chest pain and leg swelling.  Gastrointestinal: Negative for abdominal distention, abdominal pain and constipation.    Genitourinary: Negative for difficulty urinating, dysuria, flank pain, frequency and urgency.  Musculoskeletal: Positive for back pain. Negative for gait problem and joint swelling.  Skin: Negative for rash.  Neurological: Negative for weakness and numbness.     Physical Exam Updated Vital Signs BP (!) 106/51   Pulse 68   Temp 97.6 F (36.4 C) (Oral)   Resp 20   Ht 5\' 2"  (1.575 m)   Wt 72.6 kg   SpO2 93%   BMI 29.26 kg/m   Physical Exam  Skin: There is erythema.     Exquisite ttp right flank with light touch, radiates to right midaxillary line.  Erythema.  2 distinct small raised papules (look like mosquito bites), no vesicles.  No midline lumbar pain.      ED Treatments / Results  Labs (all labs ordered are listed, but only abnormal results are displayed) Labs Reviewed - No data to display  EKG  EKG Interpretation None       Radiology No results found.  Procedures Procedures (including critical care time)  Medications Ordered in ED Medications  valACYclovir (VALTREX) tablet 1,000 mg (1,000 mg Oral Given 12/24/15 1044)  acetaminophen-codeine (TYLENOL #3) 300-30 MG per tablet 1 tablet (1 tablet Oral Given 12/24/15 1044)  predniSONE (DELTASONE) tablet 60 mg (60 mg Oral Given 12/24/15 1044)     Initial Impression / Assessment and Plan / ED Course  I have reviewed the triage vital signs and the nursing notes.  Pertinent labs & imaging results that were available during my care of the patient were reviewed by me and considered in my medical decision making (see chart for details).  Clinical Course     Given the pain quality and distribution suspect early shingles.  Pt does endorse h/o varicella as a child.  Discussed course of illness, contagiousness to certain groups of people and to avoid until scabbing has occurred.    Final Clinical Impressions(s) / ED Diagnoses   Final diagnoses:  Shingles    New Prescriptions Discharge Medication List as of  12/24/2015 10:13 AM    START taking these medications   Details  acetaminophen-codeine (TYLENOL #3) 300-30 MG tablet Take 1-2 tablets by mouth every 6 (six) hours as needed for moderate pain., Starting Thu 12/24/2015, Print    predniSONE (DELTASONE) 10 MG tablet 6, 5, 4, 3, 2 then 1 tablet by mouth daily for 6 days total., Print    valACYclovir (VALTREX) 1000 MG tablet Take 1 tablet (1,000 mg total) by mouth 3 (three) times daily., Starting Thu 12/24/2015, Until Thu 12/31/2015, Print         Evalee Jefferson, PA-C 12/24/15 [place], DO 12/26/15 1539

## 2015-12-24 NOTE — ED Triage Notes (Signed)
Pt c/o right side back pain x 3 days. Denies injury. Denies gi/gu sx. nad noted.

## 2016-01-22 ENCOUNTER — Other Ambulatory Visit (HOSPITAL_COMMUNITY): Payer: Self-pay | Admitting: Family Medicine

## 2016-01-22 DIAGNOSIS — R1031 Right lower quadrant pain: Secondary | ICD-10-CM | POA: Diagnosis not present

## 2016-01-26 ENCOUNTER — Ambulatory Visit (HOSPITAL_COMMUNITY): Admission: RE | Admit: 2016-01-26 | Payer: PPO | Source: Ambulatory Visit

## 2016-01-28 ENCOUNTER — Ambulatory Visit (HOSPITAL_COMMUNITY)
Admission: RE | Admit: 2016-01-28 | Discharge: 2016-01-28 | Disposition: A | Discharge status: Home / Self Care | Payer: PPO | Source: Ambulatory Visit | Attending: Family Medicine | Admitting: Family Medicine

## 2016-01-28 DIAGNOSIS — N200 Calculus of kidney: Secondary | ICD-10-CM | POA: Diagnosis not present

## 2016-01-28 DIAGNOSIS — R1031 Right lower quadrant pain: Secondary | ICD-10-CM | POA: Insufficient documentation

## 2016-02-03 ENCOUNTER — Other Ambulatory Visit (HOSPITAL_COMMUNITY): Payer: Self-pay | Admitting: Family Medicine

## 2016-02-03 DIAGNOSIS — R1031 Right lower quadrant pain: Secondary | ICD-10-CM

## 2016-02-09 ENCOUNTER — Ambulatory Visit (HOSPITAL_COMMUNITY)
Admission: RE | Admit: 2016-02-09 | Discharge: 2016-02-09 | Disposition: A | Discharge status: Home / Self Care | Payer: PPO | Source: Ambulatory Visit | Attending: Family Medicine | Admitting: Family Medicine

## 2016-02-09 ENCOUNTER — Ambulatory Visit (HOSPITAL_COMMUNITY): Admission: RE | Admit: 2016-02-09 | Payer: PPO | Source: Ambulatory Visit

## 2016-02-09 DIAGNOSIS — R1031 Right lower quadrant pain: Secondary | ICD-10-CM | POA: Insufficient documentation

## 2016-02-09 DIAGNOSIS — I700 Atherosclerosis of aorta: Secondary | ICD-10-CM | POA: Diagnosis not present

## 2016-02-10 DIAGNOSIS — R1031 Right lower quadrant pain: Secondary | ICD-10-CM | POA: Diagnosis not present

## 2016-02-10 DIAGNOSIS — F419 Anxiety disorder, unspecified: Secondary | ICD-10-CM | POA: Diagnosis not present

## 2016-02-10 DIAGNOSIS — E782 Mixed hyperlipidemia: Secondary | ICD-10-CM | POA: Diagnosis not present

## 2016-02-10 DIAGNOSIS — N390 Urinary tract infection, site not specified: Secondary | ICD-10-CM | POA: Diagnosis not present

## 2016-02-10 DIAGNOSIS — F17210 Nicotine dependence, cigarettes, uncomplicated: Secondary | ICD-10-CM | POA: Diagnosis not present

## 2016-02-11 DIAGNOSIS — E782 Mixed hyperlipidemia: Secondary | ICD-10-CM | POA: Diagnosis not present

## 2016-02-11 DIAGNOSIS — R5383 Other fatigue: Secondary | ICD-10-CM | POA: Diagnosis not present

## 2016-02-11 DIAGNOSIS — E039 Hypothyroidism, unspecified: Secondary | ICD-10-CM | POA: Diagnosis not present

## 2016-02-11 DIAGNOSIS — J449 Chronic obstructive pulmonary disease, unspecified: Secondary | ICD-10-CM | POA: Diagnosis not present

## 2016-02-23 ENCOUNTER — Encounter (HOSPITAL_COMMUNITY): Payer: Self-pay

## 2016-02-23 ENCOUNTER — Emergency Department (HOSPITAL_COMMUNITY)
Admission: EM | Admit: 2016-02-23 | Discharge: 2016-02-23 | Disposition: A | Discharge status: Home / Self Care | Payer: PPO | Attending: Emergency Medicine | Admitting: Emergency Medicine

## 2016-02-23 DIAGNOSIS — R109 Unspecified abdominal pain: Secondary | ICD-10-CM | POA: Diagnosis not present

## 2016-02-23 DIAGNOSIS — E039 Hypothyroidism, unspecified: Secondary | ICD-10-CM | POA: Insufficient documentation

## 2016-02-23 DIAGNOSIS — F17210 Nicotine dependence, cigarettes, uncomplicated: Secondary | ICD-10-CM | POA: Insufficient documentation

## 2016-02-23 DIAGNOSIS — Z7982 Long term (current) use of aspirin: Secondary | ICD-10-CM | POA: Diagnosis not present

## 2016-02-23 DIAGNOSIS — J449 Chronic obstructive pulmonary disease, unspecified: Secondary | ICD-10-CM | POA: Diagnosis not present

## 2016-02-23 LAB — CBC
HEMATOCRIT: 47.3 % — AB (ref 36.0–46.0)
Hemoglobin: 16.2 g/dL — ABNORMAL HIGH (ref 12.0–15.0)
MCH: 31.4 pg (ref 26.0–34.0)
MCHC: 34.2 g/dL (ref 30.0–36.0)
MCV: 91.7 fL (ref 78.0–100.0)
PLATELETS: 306 10*3/uL (ref 150–400)
RBC: 5.16 MIL/uL — ABNORMAL HIGH (ref 3.87–5.11)
RDW: 14.1 % (ref 11.5–15.5)
WBC: 10.2 10*3/uL (ref 4.0–10.5)

## 2016-02-23 LAB — COMPREHENSIVE METABOLIC PANEL
ALBUMIN: 3.9 g/dL (ref 3.5–5.0)
ALT: 16 U/L (ref 14–54)
AST: 17 U/L (ref 15–41)
Alkaline Phosphatase: 60 U/L (ref 38–126)
Anion gap: 8 (ref 5–15)
BUN: 12 mg/dL (ref 6–20)
CHLORIDE: 104 mmol/L (ref 101–111)
CO2: 27 mmol/L (ref 22–32)
CREATININE: 0.89 mg/dL (ref 0.44–1.00)
Calcium: 9.2 mg/dL (ref 8.9–10.3)
GFR calc Af Amer: >60 mL/min (ref >60)
GLUCOSE: 80 mg/dL (ref 65–99)
POTASSIUM: 4.2 mmol/L (ref 3.5–5.1)
SODIUM: 139 mmol/L (ref 135–145)
Total Bilirubin: 0.6 mg/dL (ref 0.3–1.2)
Total Protein: 7 g/dL (ref 6.5–8.1)

## 2016-02-23 LAB — LIPASE, BLOOD: LIPASE: 25 U/L (ref 11–51)

## 2016-02-23 LAB — URINALYSIS, ROUTINE W REFLEX MICROSCOPIC
Bilirubin Urine: NEGATIVE
GLUCOSE, UA: NEGATIVE mg/dL
KETONES UR: NEGATIVE mg/dL
LEUKOCYTES UA: NEGATIVE
Nitrite: NEGATIVE
PH: 7.5 (ref 5.0–8.0)
PROTEIN: NEGATIVE mg/dL
Specific Gravity, Urine: 1.008 (ref 1.005–1.030)

## 2016-02-23 LAB — URINE MICROSCOPIC-ADD ON

## 2016-02-23 MED ORDER — PREDNISONE 20 MG PO TABS: 40.0000 mg | ORAL_TABLET | Freq: Every day | ORAL | 0 refills | Status: DC

## 2016-02-23 MED ORDER — GABAPENTIN 300 MG PO CAPS: 300.0000 mg | ORAL_CAPSULE | Freq: Three times a day (TID) | ORAL | 0 refills | Status: DC

## 2016-02-23 NOTE — ED Provider Notes (Signed)
[facility] DEPT Provider Note   CSN: [id number] Arrival date & time: 02/23/16  [id number]     History   Chief Complaint Chief Complaint  Patient presents with  . Abdominal Pain    HPI Lori Lopez is a 65 y.o. female.  HPI Patient has had right-sided back pain since August. Now when more in the abdomen. Was initially diagnosed with shingles at [facility] Since then she has had ultrasounds and CTs. No clear cause found. Continued pain. She's had some constipation. No fevers. Pain is worse with movements. States she has had back surgery past. Pain is dull. No dysuria. States she's been told she had kidney stones.  Past Medical History:  Diagnosis Date  . Abnormal bone density screening 09/24/2009  . Adjustment disorder with depressed mood 09/08/2009  . Allergic rhinitis due to pollen 09/08/2009  . Anxiety   . Anxiety disorder 10/25/2010  . Cervicalgia 09/08/2009  . Chest discomfort 10/10/14  . Cigarette nicotine dependence, uncomplicated [id number]  . COPD (chronic obstructive pulmonary disease) ([place]) 11/05/2010  . Degeneration of thoracolumbar intervertebral disc 06/13/2013  . 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 Epigastric pain   . Hypothyroidism   . Mixed hyperlipidemia 05/15/2013  . Obesity due to excess calories 05/15/2013  . Pain in thoracic spine 09/08/2009  . Tinea unguium 05/15/2013  . Vitamin D deficiency 12/28/2009    Patient Active Problem List   Diagnosis Date Noted  . SUI (stress urinary incontinence, female) 10/13/2014    Past Surgical History:  Procedure Laterality Date  . BACK SURGERY      OB History    No data available       Home Medications    Prior to Admission medications   Medication Sig Start Date End Date Taking? Authorizing Provider  acetaminophen-codeine (TYLENOL #3) 300-30 MG tablet Take 1-2 tablets by mouth every 6 (six) hours as needed for moderate pain. 12/24/15  Yes Evalee Jefferson, PA-C  aspirin 81 MG tablet Take  81 mg by mouth daily.   Yes Historical Provider, MD  Cholecalciferol (VITAMIN D3) 400 UNITS CAPS Take 1 tablet by mouth daily.    Yes Historical Provider, MD  nitroGLYCERIN (NITROSTAT) 0.4 MG SL tablet Place 1 tablet (0.4 mg total) under the tongue every 5 (five) minutes as needed for chest pain. 11/04/14  Yes Herminio Commons, MD  estradiol (ESTRACE VAGINAL) 0.1 MG/GM vaginal cream Place 1 Applicatorful vaginally at bedtime. Patient not taking: Reported on 02/23/2016 10/13/14   Florian Buff, MD  gabapentin (NEURONTIN) 300 MG capsule Take 1 capsule (300 mg total) by mouth 3 (three) times daily. 1 pill first day. 1 pill twice a day on the second day, then tid 02/23/16   Davonna Belling, MD  predniSONE (DELTASONE) 20 MG tablet Take 2 tablets (40 mg total) by mouth daily. 02/23/16   Davonna Belling, MD    Family History Family History  Problem Relation Age of Onset  . Diabetes Mother   . CAD Mother   . Heart disease Mother   . CAD Father     Social History Social History  Substance Use Topics  . Smoking status: Current Some Day Smoker    Types: Cigarettes  . Smokeless tobacco: Never Used     Comment: 2-3 weekly  . Alcohol use No     Allergies   Hydrocodone; Lyrica [pregabalin]; and Shrimp [shellfish allergy]   Review of Systems Review of Systems  Constitutional: Negative for appetite change and fever.  HENT:  Negative for congestion.   Respiratory: Negative for shortness of breath.   Cardiovascular: Negative for chest pain.  Gastrointestinal: Positive for abdominal pain, constipation and diarrhea.  Genitourinary: Negative for difficulty urinating and dyspareunia.  Musculoskeletal: Positive for back pain.  Skin: Negative for rash.  Hematological: Negative for adenopathy.     Physical Exam Updated Vital Signs BP 116/61   Pulse 70   Temp 97.5 F (36.4 C) (Oral)   Resp 18   SpO2 92%   Physical Exam  Constitutional: She appears well-developed and well-nourished.    HENT:  Head: Atraumatic.  Neck: Neck supple.  Pulmonary/Chest: Effort normal.  Abdominal: Soft. There is tenderness.  Musculoskeletal: She exhibits tenderness.  Tenderness right CVA and back area. No rash. Some tenderness on right abdomen. No hernia palpated.  Skin: Skin is warm. Capillary refill takes less than 2 seconds.  Psychiatric: She has a normal mood and affect.     ED Treatments / Results  Labs (all labs ordered are listed, but only abnormal results are displayed) Labs Reviewed  CBC - Abnormal; Notable for the following:       Result Value   RBC 5.16 (*)    Hemoglobin 16.2 (*)    HCT 47.3 (*)    All other components within normal limits  URINALYSIS, ROUTINE W REFLEX MICROSCOPIC (NOT AT [facility]) - Abnormal; Notable for the following:    Hgb urine dipstick SMALL (*)    All other components within normal limits  URINE MICROSCOPIC-ADD ON - Abnormal; Notable for the following:    Squamous Epithelial / LPF 0-5 (*)    Bacteria, UA RARE (*)    All other components within normal limits  LIPASE, BLOOD  COMPREHENSIVE METABOLIC PANEL    EKG  EKG Interpretation None       Radiology No results found.  Procedures Procedures (including critical care time)  Medications Ordered in ED Medications - No data to display   Initial Impression / Assessment and Plan / ED Course  I have reviewed the triage vital signs and the nursing notes.  Pertinent labs & imaging results that were available during my care of the patient were reviewed by me and considered in my medical decision making (see chart for details).  Clinical Course    Patient with 3 months of abdominal pain. Has been seen in the ER and by her primary care doctor for it. Previous CT scan reviewed showed some mesenteric irritation. Labs reassuring here. Will discharge home to follow-up with her primary care doctor. Has had zoster previously and states she was feeling better after treatment with steroids. 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   Final Clinical Impressions(s) / ED Diagnoses   Final diagnoses:  Flank pain    New Prescriptions Discharge Medication List as of 02/23/2016 11:24 AM    START taking these medications   Details  gabapentin (NEURONTIN) 300 MG capsule Take 1 capsule (300 mg total) by mouth 3 (three) times daily. 1 pill first day. 1 pill twice a day on the second day, then tid, Starting Tue 02/23/2016, Print         Davonna Belling, MD 02/23/16 1625

## 2016-02-23 NOTE — ED Notes (Signed)
PT is in stable condition upon d/c and ambulates from ED. 

## 2016-02-23 NOTE — Discharge Planning (Signed)
[facility] reviewed discharging chart for possible CM needs.  No needs identified.    Follow-up appointment needs? no  Transportation needs? no  Medication assistance needs? Pt given Rx and has insurance coverage  Equipment needs? no                                      Oxygen needs? no  H.H needs? no  Lissete Maestas J. Clydene Laming, [place], [place], [place]

## 2016-02-23 NOTE — ED Triage Notes (Signed)
Patient complains of generalized abdominal pain that she describes as sharp with radiation to back since august. Nausea and constipation with same. Pain is intermittent

## 2016-03-01 ENCOUNTER — Telehealth: Payer: Self-pay

## 2016-03-01 NOTE — Telephone Encounter (Signed)
Pt received triage letter from DS. Please call [phone]

## 2016-03-07 NOTE — Telephone Encounter (Signed)
LMOM for a return call. Pt was referred by Dr. Karie Kirks for screening colonoscopy. She is on Dr. Oneida Alar recall for 04/08/2017.

## 2016-03-07 NOTE — Telephone Encounter (Signed)
Patient is having blood in her stool for several months  She is scheduled for a ov to see Vicente Males 11/2 at 9:30 and she is aware of her appt  Lori Lopez, can you call Dr. Vickey Sages office to retrieve any recent labs, ov, and imaging studies

## 2016-03-08 NOTE — Telephone Encounter (Signed)
Requested labs, OV and imaging studies from PCP

## 2016-03-10 ENCOUNTER — Ambulatory Visit (INDEPENDENT_AMBULATORY_CARE_PROVIDER_SITE_OTHER): Payer: PPO | Admitting: Gastroenterology

## 2016-03-10 ENCOUNTER — Encounter: Payer: Self-pay | Admitting: Gastroenterology

## 2016-03-10 ENCOUNTER — Other Ambulatory Visit: Payer: Self-pay

## 2016-03-10 ENCOUNTER — Encounter (INDEPENDENT_AMBULATORY_CARE_PROVIDER_SITE_OTHER): Payer: Self-pay

## 2016-03-10 VITALS — BP 133/73 | HR 93 | Temp 97.8°F | Ht 62.0 in | Wt 163.0 lb

## 2016-03-10 DIAGNOSIS — K625 Hemorrhage of anus and rectum: Secondary | ICD-10-CM

## 2016-03-10 DIAGNOSIS — K5900 Constipation, unspecified: Secondary | ICD-10-CM | POA: Diagnosis not present

## 2016-03-10 MED ORDER — PEG 3350-KCL-NA BICARB-NACL 420 G PO SOLR: 4000.0000 mL | ORAL | 0 refills | Status: DC

## 2016-03-10 NOTE — Progress Notes (Addendum)
REVIEWED-NO ADDITIONAL RECOMMENDATIONS.  Primary Care Physician:  Robert Bellow, MD Primary Gastroenterologist:  Dr. Oneida Alar   Chief Complaint  Patient presents with  . Abdominal Pain    HPI:   Lori Lopez is a 65 y.o. female presenting today at the request of Dr. Karie Kirks for screening colonoscopy. Last colonoscopy in 2008 by Dr. Oneida Alar with normal colon, small internal hemorrhoids. Notes rectal bleeding earlier this year.   Onset of pain in August, improved since that time but still present. Notes pain at umbilicus, hurts to "mash" on it. Pain in RLQ, right flank pain. Hurts to put on bra. Touching skin hurts. Pain exacerbated by "rubbing" across skin. Can't stand for anything to touch it. Pain feels "burning", "throbbing". When busy cleaning or doing something, doesn't hurt. History of chronic back problems. Has an appt with a neurosurgeon on Nov 29th.   Pain improved sometimes after BM. Feels like stools aren't productive. Tried treatment with Neurontin due to question of shingles; seemed to manage better for awhile but now not helping. Sed rate normal at 2.   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   Past Medical History:  Diagnosis Date  . Abnormal bone density screening 09/24/2009  . Adjustment disorder with depressed mood 09/08/2009  . Allergic rhinitis due to pollen 09/08/2009  . Anxiety   . Anxiety disorder 10/25/2010  . Cervicalgia 09/08/2009  . Chest discomfort 10/10/14  . Cigarette nicotine dependence, uncomplicated Q000111Q  . COPD (chronic obstructive pulmonary disease) ([place]) 11/05/2010  . Degeneration of thoracolumbar intervertebral disc 06/13/2013  . 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 Epigastric pain   . Hypothyroidism   . Mixed hyperlipidemia 05/15/2013  . Obesity due to excess calories 05/15/2013  . Pain in thoracic spine 09/08/2009  . Tinea unguium 05/15/2013  .  Vitamin D deficiency 12/28/2009    Past Surgical History:  Procedure Laterality Date  . BACK SURGERY    . COLONOSCOPY  2008   Dr. Oneida Alar: normal colon, small internal hemorrhoids.     Current Outpatient Prescriptions  Medication Sig Dispense Refill  . acetaminophen-codeine (TYLENOL #3) 300-30 MG tablet Take 1-2 tablets by mouth every 6 (six) hours as needed for moderate pain. 30 tablet 0  . aspirin 81 MG tablet Take 81 mg by mouth daily.    . Cholecalciferol (VITAMIN D3) 400 UNITS CAPS Take 1 tablet by mouth daily.     Marland Kitchen gabapentin (NEURONTIN) 300 MG capsule Take 1 capsule (300 mg total) by mouth 3 (three) times daily. 1 pill first day. 1 pill twice a day on the second day, then tid 20 capsule 0  . nitroGLYCERIN (NITROSTAT) 0.4 MG SL tablet Place 1 tablet (0.4 mg total) under the tongue every 5 (five) minutes as needed for chest pain. 25 tablet 3   No current facility-administered medications for this visit.     Allergies as of 03/10/2016 - Review Complete 03/10/2016  Allergen Reaction Noted  . Hydrocodone  10/10/2014  . Lyrica [pregabalin]  10/10/2014  . Shrimp [shellfish allergy]  10/10/2014    Family History  Problem Relation Age of Onset  . Diabetes Mother   . CAD Mother   . Heart disease Mother   . CAD Father   . Colon cancer Neg Hx   . Colon polyps Neg Hx     Social History   Social History  . Marital status: Married    Spouse name:  N/A  . Number of children: N/A  . Years of education: N/A   Occupational History  . Not on file.   Social History Main Topics  . Smoking status: Current Some Day Smoker    Types: Cigarettes  . Smokeless tobacco: Never Used     Comment: 2-3 weekly  . Alcohol use No  . Drug use: No  . Sexual activity: Not on file   Other Topics Concern  . Not on file   Social History Narrative  . No narrative on file    Review of Systems: Gen: Denies any fever, chills, fatigue, weight loss, lack of appetite.  CV: Denies chest pain,  heart palpitations, peripheral edema, syncope.  Resp: Denies shortness of breath at rest or with exertion. Denies wheezing or cough.  GI: as mentioned in HPI  GU : Denies urinary burning, urinary frequency, urinary hesitancy MS: As mentioned in HPI  Derm: Denies rash, itching, dry skin Psych: Denies depression, anxiety, memory loss, and confusion Heme: Denies bruising, bleeding, and enlarged lymph nodes.  Physical Exam: BP 133/73   Pulse 93   Temp 97.8 F (36.6 C) (Oral)   Ht 5\' 2"  (1.575 m)   Wt 163 lb (73.9 kg)   BMI 29.81 kg/m  General:   Alert and oriented. Pleasant and cooperative. Well-nourished and well-developed.  Head:  Normocephalic and atraumatic. Eyes:  Without icterus, sclera clear and conjunctiva pink.  Ears:  Normal auditory acuity. Nose:  No deformity, discharge,  or lesions. Mouth:  No deformity or lesions, oral mucosa pink.  Lungs:  Clear to auscultation bilaterally. No wheezes, rales, or rhonchi. No distress.  Heart:  S1, S2 present without murmurs appreciated.  Abdomen:  +BS, soft, non-tender to palpation while laying down, non-distended. No HSM noted. No guarding or rebound. No masses appreciated. When standing, tender to palpation with just slight touch across right side of abdomen  Rectal:  Deferred  Msk:  Symmetrical without gross deformities. Normal posture. Extremities:  Without edema. Neurologic:  Alert and  oriented x4;  grossly normal neurologically. Skin:  Intact without significant lesions or rashes. Psych:  Alert and cooperative. Normal mood and affect.

## 2016-03-10 NOTE — Patient Instructions (Signed)
For constipation: start taking Linzess 145 mcg once each morning, 30 minutes before breakfast. This takes the place of stool softeners and laxatives.   I will review the CT scan with the radiologist. We may need further imaging.   We have scheduled you for a colonoscopy with Dr. Oneida Alar in the near future.

## 2016-03-11 DIAGNOSIS — R109 Unspecified abdominal pain: Secondary | ICD-10-CM | POA: Diagnosis not present

## 2016-03-11 NOTE — Assessment & Plan Note (Addendum)
64 year old female with rectal bleeding earlier this year, last colonoscopy in 2008 overall normal. Now with RLQ/right flank pain since August, with interesting symptoms to include pain only with "brushing": against abdomen and does not notice pain when distracted with other activities. 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 Will need to review with radiologist.   Proceed with colonoscopy with Dr. Oneida Alar in the near future. The risks, benefits, and alternatives have been discussed in detail with the patient. They state understanding and desire to proceed.  Phenergan 25 mg IV on call  Reviewed CT with radiology, Dr. [place] Pink. Small lymph nodes noted, lymphatic stranding. Very non-specific and sometimes not called at all on CTs due to lack of specificity. 2008 CT chest in 2012, although not of the abdomen, actually showed possible "wispiness" in the mesentery. Again, non-specific. No indication for repeat CT or further evaluation. If she has another CT in future, could compare the 2. However, CT back from 2008 was similar with 2012 showing only slightly more strandiness of the mesentery; therefore, this is felt to be a benign process.

## 2016-03-11 NOTE — Assessment & Plan Note (Addendum)
Start Linzess 145 mcg onc daily. Samples provided.

## 2016-03-22 NOTE — Progress Notes (Signed)
cc'd to pcp 

## 2016-03-25 ENCOUNTER — Telehealth: Payer: Self-pay | Admitting: Gastroenterology

## 2016-03-25 NOTE — Telephone Encounter (Signed)
Please let patient know that I reviewed the CT with radiology. It was a non-specific finding, and no further imaging is needed. Continue with current management plan.

## 2016-03-28 NOTE — Telephone Encounter (Signed)
PT is aware.

## 2016-04-06 ENCOUNTER — Ambulatory Visit (INDEPENDENT_AMBULATORY_CARE_PROVIDER_SITE_OTHER): Payer: PPO | Admitting: Urology

## 2016-04-06 ENCOUNTER — Other Ambulatory Visit (HOSPITAL_COMMUNITY)
Admission: RE | Admit: 2016-04-06 | Discharge: 2016-04-06 | Disposition: A | Discharge status: Home / Self Care | Payer: PPO | Source: Other Acute Inpatient Hospital | Attending: Urology | Admitting: Urology

## 2016-04-06 DIAGNOSIS — R3915 Urgency of urination: Secondary | ICD-10-CM | POA: Diagnosis not present

## 2016-04-06 DIAGNOSIS — R1084 Generalized abdominal pain: Secondary | ICD-10-CM | POA: Diagnosis not present

## 2016-04-06 DIAGNOSIS — R319 Hematuria, unspecified: Secondary | ICD-10-CM | POA: Diagnosis not present

## 2016-04-06 LAB — URINALYSIS, ROUTINE W REFLEX MICROSCOPIC
Bilirubin Urine: NEGATIVE
Glucose, UA: NEGATIVE mg/dL
Ketones, ur: NEGATIVE mg/dL
LEUKOCYTES UA: NEGATIVE
NITRITE: NEGATIVE
PH: 6 (ref 5.0–8.0)
Protein, ur: NEGATIVE mg/dL

## 2016-04-06 LAB — URINE MICROSCOPIC-ADD ON

## 2016-04-08 ENCOUNTER — Encounter (HOSPITAL_COMMUNITY)
Admission: RE | Disposition: A | Discharge status: Home / Self Care | Payer: Self-pay | Source: Ambulatory Visit | Attending: Gastroenterology

## 2016-04-08 ENCOUNTER — Ambulatory Visit (HOSPITAL_COMMUNITY)
Admission: RE | Admit: 2016-04-08 | Discharge: 2016-04-08 | Disposition: A | Discharge status: Home / Self Care | Payer: PPO | Source: Ambulatory Visit | Attending: Gastroenterology | Admitting: Gastroenterology

## 2016-04-08 ENCOUNTER — Encounter (HOSPITAL_COMMUNITY): Payer: Self-pay | Admitting: *Deleted

## 2016-04-08 DIAGNOSIS — Z79899 Other long term (current) drug therapy: Secondary | ICD-10-CM | POA: Insufficient documentation

## 2016-04-08 DIAGNOSIS — E559 Vitamin D deficiency, unspecified: Secondary | ICD-10-CM | POA: Diagnosis not present

## 2016-04-08 DIAGNOSIS — J449 Chronic obstructive pulmonary disease, unspecified: Secondary | ICD-10-CM | POA: Diagnosis not present

## 2016-04-08 DIAGNOSIS — F419 Anxiety disorder, unspecified: Secondary | ICD-10-CM | POA: Insufficient documentation

## 2016-04-08 DIAGNOSIS — Z7982 Long term (current) use of aspirin: Secondary | ICD-10-CM | POA: Insufficient documentation

## 2016-04-08 DIAGNOSIS — F17210 Nicotine dependence, cigarettes, uncomplicated: Secondary | ICD-10-CM | POA: Diagnosis not present

## 2016-04-08 DIAGNOSIS — E039 Hypothyroidism, unspecified: Secondary | ICD-10-CM | POA: Insufficient documentation

## 2016-04-08 DIAGNOSIS — Q438 Other specified congenital malformations of intestine: Secondary | ICD-10-CM | POA: Insufficient documentation

## 2016-04-08 DIAGNOSIS — K621 Rectal polyp: Secondary | ICD-10-CM | POA: Diagnosis not present

## 2016-04-08 DIAGNOSIS — K648 Other hemorrhoids: Secondary | ICD-10-CM | POA: Diagnosis not present

## 2016-04-08 DIAGNOSIS — F4321 Adjustment disorder with depressed mood: Secondary | ICD-10-CM | POA: Diagnosis not present

## 2016-04-08 DIAGNOSIS — K635 Polyp of colon: Secondary | ICD-10-CM | POA: Insufficient documentation

## 2016-04-08 DIAGNOSIS — K625 Hemorrhage of anus and rectum: Secondary | ICD-10-CM | POA: Insufficient documentation

## 2016-04-08 DIAGNOSIS — K644 Residual hemorrhoidal skin tags: Secondary | ICD-10-CM | POA: Insufficient documentation

## 2016-04-08 DIAGNOSIS — E782 Mixed hyperlipidemia: Secondary | ICD-10-CM | POA: Diagnosis not present

## 2016-04-08 DIAGNOSIS — D123 Benign neoplasm of transverse colon: Secondary | ICD-10-CM | POA: Diagnosis not present

## 2016-04-08 DIAGNOSIS — D124 Benign neoplasm of descending colon: Secondary | ICD-10-CM | POA: Diagnosis not present

## 2016-04-08 HISTORY — PX: COLONOSCOPY: SHX5424

## 2016-04-08 SURGERY — COLONOSCOPY
Anesthesia: Moderate Sedation

## 2016-04-08 MED ORDER — MEPERIDINE HCL 100 MG/ML IJ SOLN
INTRAMUSCULAR | Status: DC | PRN
  Administered 2016-04-08 (×4): 25 mg via INTRAVENOUS

## 2016-04-08 MED ORDER — PROMETHAZINE HCL 25 MG/ML IJ SOLN
25.0000 mg | Freq: Once | INTRAMUSCULAR | Status: AC
  Administered 2016-04-08: 25 mg via INTRAVENOUS

## 2016-04-08 MED ORDER — MEPERIDINE HCL 100 MG/ML IJ SOLN
INTRAMUSCULAR | Status: AC
  Filled 2016-04-08: qty 2

## 2016-04-08 MED ORDER — PROMETHAZINE HCL 25 MG/ML IJ SOLN
INTRAMUSCULAR | Status: AC
  Filled 2016-04-08: qty 1

## 2016-04-08 MED ORDER — SODIUM CHLORIDE 0.9% FLUSH
INTRAVENOUS | Status: DC
Start: 2016-04-08 — End: 2016-04-08
  Filled 2016-04-08: qty 10

## 2016-04-08 MED ORDER — MIDAZOLAM HCL 5 MG/5ML IJ SOLN
INTRAMUSCULAR | Status: AC
  Filled 2016-04-08: qty 10

## 2016-04-08 MED ORDER — MIDAZOLAM HCL 5 MG/5ML IJ SOLN
INTRAMUSCULAR | Status: DC | PRN
  Administered 2016-04-08 (×2): 2 mg via INTRAVENOUS
  Administered 2016-04-08: 1 mg via INTRAVENOUS

## 2016-04-08 MED ORDER — SODIUM CHLORIDE 0.9 % IV SOLN
INTRAVENOUS | Status: DC
  Administered 2016-04-08: 10:00:00 via INTRAVENOUS

## 2016-04-08 NOTE — Op Note (Signed)
[facility] Patient Name: Lori Lopez Procedure Date: 04/08/2016 10:53 AM MRN: [id number] Date of Birth: March 21, 1951 Attending MD: Barney Drain , MD CSN: [id number] Age: 65 Admit Type: Outpatient Procedure:                Colonoscopy WITH COLD FORCEPS/SNARE POLYPECTOMY Indications:              Rectal bleeding Providers:                Barney Drain, MD, Lurline Del, RN, Randa Spike,                            Technician Referring MD:             Newt Minion, MD Medicines:                Promethazine 25 mg IV, Meperidine 100 mg IV,                            Midazolam 5 mg IV Complications:            No immediate complications. Estimated Blood Loss:     Estimated blood loss was minimal. Procedure:                Pre-Anesthesia Assessment:                           - Prior to the procedure, a History and Physical                            was performed, and patient medications and                            allergies were reviewed. The patient's tolerance of                            previous anesthesia was also reviewed. The risks                            and benefits of the procedure and the sedation                            options and risks were discussed with the patient.                            All questions were answered, and informed consent                            was obtained. Prior Anticoagulants: The patient has                            taken aspirin, last dose was 1 day prior to                            procedure. ASA Grade Assessment: II - A patient  with mild systemic disease. After reviewing the                            risks and benefits, the patient was deemed in                            satisfactory condition to undergo the procedure.                            After obtaining informed consent, the colonoscope                            was passed under direct vision. Throughout the     procedure, the patient's blood pressure, pulse, and                            oxygen saturations were monitored continuously. The                            [id number] scope was introduced through                            the anus and advanced to the 10 cm into the ileum.                            The colonoscopy was somewhat difficult due to a                            tortuous colon. Successful completion of the                            procedure was aided by COLOWRAP. The patient                            tolerated the procedure fairly well. The quality of                            the bowel preparation was good. The terminal ileum,                            ileocecal valve, appendiceal orifice, and rectum                            were photographed. Scope In: 11:18:09 AM Scope Out: 11:43:51 AM Scope Withdrawal Time: 0 hours 24 minutes 3 seconds  Total Procedure Duration: 0 hours 25 minutes 42 seconds  Findings:      The terminal ileum appeared normal.      Two sessile polyps were found in the descending colon and transverse       colon. The polyps were 5 to 6 mm in size. These polyps were removed with       a hot snare. Resection and retrieval were complete.      Three sessile polyps were found in the rectum. The polyps were 2 to 4  mm       in size. These polyps were removed with a cold biopsy forceps. Resection       and retrieval were complete.      Internal hemorrhoids were found. The hemorrhoids were moderate.      External hemorrhoids were found. The hemorrhoids were moderate. Impression:               - The examined portion of the ileum was normal.                           - Two 5 to 6 mm polyps in the descending colon and                            in the transverse colon, removed with a hot snare.                            Resected and retrieved.                           - Three 2 to 4 mm polyps in the rectum, removed                            with  a cold biopsy forceps. Resected and retrieved.                           - Internal hemorrhoids.                           - External hemorrhoids. Moderate Sedation:      Moderate (conscious) sedation was administered by the endoscopy nurse       and supervised by the endoscopist. The following parameters were       monitored: oxygen saturation, heart rate, blood pressure, and response       to care. Total physician intraservice time was 39 minutes. Recommendation:           - High fiber diet.                           - Continue present medications.                           - Await pathology results.                           - Repeat colonoscopy in 5-10 years for surveillance.                           - Patient has a contact number available for                            emergencies. The signs and symptoms of potential                            delayed complications were discussed with the  patient. Return to normal activities tomorrow.                            Written discharge instructions were provided to the                            patient. Procedure Code(s):        --- Professional ---                           [phone], Colonoscopy, flexible; with removal of                            tumor(s), polyp(s), or other lesion(s) by snare                            technique                           45380, 59, Colonoscopy, flexible; with biopsy,                            single or multiple                           99152, Moderate sedation services provided by the                            same physician or other qualified health care                            professional performing the diagnostic or                            therapeutic service that the sedation supports,                            requiring the presence of an independent trained                            observer to assist in the monitoring of the                             patient's level of consciousness and physiological                            status; initial 15 minutes of intraservice time,                            patient age 79 years or older                           [phone], Moderate sedation services; each additional                            15 minutes intraservice  time                           [phone], Moderate sedation services; each additional                            15 minutes intraservice time Diagnosis Code(s):        --- Professional ---                           K64.4, Residual hemorrhoidal skin tags                           K64.8, Other hemorrhoids                           D12.4, Benign neoplasm of descending colon                           D12.3, Benign neoplasm of transverse colon (hepatic                            flexure or splenic flexure)                           K62.1, Rectal polyp                           K62.5, Hemorrhage of anus and rectum CPT copyright 2016 [employer]. All rights reserved. The codes documented in this report are preliminary and upon coder review may  be revised to meet current compliance requirements. Barney Drain, MD Barney Drain, MD 04/08/2016 12:01:23 PM This report has been signed electronically. Number of Addenda: 0

## 2016-04-08 NOTE — H&P (Signed)
Primary Care Physician:  Robert Bellow, MD Primary Gastroenterologist:  Dr. Oneida Alar  Pre-Procedure History & Physical: HPI:  Lori Lopez is a 65 y.o. female here for [place].  Past Medical History:  Diagnosis Date  . Abnormal bone density screening 09/24/2009  . Adjustment disorder with depressed mood 09/08/2009  . Allergic rhinitis due to pollen 09/08/2009  . Anxiety   . Anxiety disorder 10/25/2010  . Cervicalgia 09/08/2009  . Chest discomfort 10/10/14  . Cigarette nicotine dependence, uncomplicated [id number]  . COPD (chronic obstructive pulmonary disease) ([place]) 11/05/2010  . Degeneration of thoracolumbar intervertebral disc 06/13/2013  . 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 Epigastric pain   . Hypothyroidism   . Mixed hyperlipidemia 05/15/2013  . Obesity due to excess calories 05/15/2013  . Pain in thoracic spine 09/08/2009  . Tinea unguium 05/15/2013  . Vitamin D deficiency 12/28/2009    Past Surgical History:  Procedure Laterality Date  . BACK SURGERY    . COLONOSCOPY  2008   Dr. Oneida Alar: normal colon, small internal hemorrhoids.     Prior to Admission medications   Medication Sig Start Date End Date Taking? Authorizing Provider  acetaminophen-codeine (TYLENOL #3) 300-30 MG tablet Take 1-2 tablets by mouth every 6 (six) hours as needed for moderate pain. 12/24/15  Yes Evalee Jefferson, PA-C  ALPRAZolam Duanne Moron) 1 MG tablet Take 0.5 mg by mouth daily as needed for anxiety. 02/10/16  Yes Historical Provider, MD  aspirin 81 MG tablet Take 81 mg by mouth daily.   Yes Historical Provider, MD  Cholecalciferol (VITAMIN D3) 400 UNITS CAPS Take 1 tablet by mouth 2 (two) times daily.    Yes Historical Provider, MD  gabapentin (NEURONTIN) 300 MG capsule Take 1 capsule (300 mg total) by mouth 3 (three) times daily. 1 pill first day. 1 pill twice a day on the second day, then tid Patient taking differently: Take 300 mg by mouth 2 (two) times daily. 1 pill first  day. 1 pill twice a day on the second day, then tid 02/23/16  Yes Davonna Belling, MD  nitroGLYCERIN (NITROSTAT) 0.4 MG SL tablet Place 1 tablet (0.4 mg total) under the tongue every 5 (five) minutes as needed for chest pain. 11/04/14  Yes Herminio Commons, MD  polyethylene glycol-electrolytes (TRILYTE) 420 g solution Take 4,000 mLs by mouth as directed. Patient taking differently: Take 4,000 mLs by mouth as directed. Will do prior to procedure 03/10/16  Yes Danie Binder, MD  diphenhydrAMINE (BENADRYL) 25 mg capsule Take 25 mg by mouth daily as needed for itching.    Historical Provider, MD    Allergies as of 03/10/2016 - Review Complete 03/10/2016  Allergen Reaction Noted  . Hydrocodone  10/10/2014  . Lyrica [pregabalin]  10/10/2014  . Shrimp [shellfish allergy]  10/10/2014    Family History  Problem Relation Age of Onset  . Diabetes Mother   . CAD Mother   . Heart disease Mother   . CAD Father   . Colon cancer Neg Hx   . Colon polyps Neg Hx     Social History   Social History  . Marital status: Married    Spouse name: N/A  . Number of children: N/A  . Years of education: N/A   Occupational History  . Not on file.   Social History Main Topics  . Smoking status: Current Some Day Smoker    Types: Cigarettes  . Smokeless tobacco: Never Used     Comment: 2-3 weekly  .  Alcohol use No  . Drug use: No  . Sexual activity: Not on file   Other Topics Concern  . Not on file   Social History Narrative  . No narrative on file    Review of Systems: See HPI, otherwise negative ROS   Physical Exam: BP (!) 141/73   Pulse 85   Temp 97.7 F (36.5 C) (Oral)   Resp (!) 21   Ht 5\' 2"  (1.575 m)   Wt 162 lb (73.5 kg)   SpO2 93%   BMI 29.63 kg/m  General:   Alert,  pleasant and cooperative in NAD Head:  Normocephalic and atraumatic. Neck:  Supple; Lungs:  Clear throughout to auscultation.    Heart:  Regular rate and rhythm. Abdomen:  Soft, nontender and  nondistended. Normal bowel sounds, without guarding, and without rebound.   Neurologic:  Alert and  oriented x4;  grossly normal neurologically.  Impression/Plan:     SCREENING  Plan:  1. TCS TODAY. DISCUSSED PROCEDURE, BENEFITS, & RISKS: < 1% chance of medication reaction, bleeding, perforation, or rupture of spleen/liver.

## 2016-04-08 NOTE — Discharge Instructions (Signed)
You had 5 polyps removed. You have internal hemorrhoids, WHICH IS THE CAUSE FOR YOUR INTERMITTENT RECTAL BLEEDING.    CONTINUE YOUR WEIGHT LOSS EFFORTS. LOSE TEN POUNDS.  DRINK WATER TO KEEP YOUR URINE LIGHT YELLOW.  FOLLOW A HIGH FIBER DIET. AVOID ITEMS THAT CAUSE BLOATING & GAS. SEE INFO BELOW.  USE PREPARATION H FOUR TIMES  A DAY IF NEEDED TO RELIEVE RECTAL PAIN/PRESSURE/BLEEDING.  YOUR BIOPSY RESULTS WILL BE AVAILABLE IN MY CHART AFTER DEC 4 AND MY OFFICE WILL CONTACT YOU IN 10-14 DAYS WITH YOUR RESULTS.   Next colonoscopy in 5-10 years.    Colonoscopy Care After Read the instructions outlined below and refer to this sheet in the next week. These discharge instructions provide you with general information on caring for yourself after you leave the hospital. While your treatment has been planned according to the most current medical practices available, unavoidable complications occasionally occur. If you have any problems or questions after discharge, call DR. Maha Fischel, [phone].  ACTIVITY  You may resume your regular activity, but move at a slower pace for the next 24 hours.   Take frequent rest periods for the next 24 hours.   Walking will help get rid of the air and reduce the bloated feeling in your belly (abdomen).   No driving for 24 hours (because of the medicine (anesthesia) used during the test).   You may shower.   Do not sign any important legal documents or operate any machinery for 24 hours (because of the anesthesia used during the test).    NUTRITION  Drink plenty of fluids.   You may resume your normal diet as instructed by your doctor.   Begin with a light meal and progress to your normal diet. Heavy or fried foods are harder to digest and may make you feel sick to your stomach (nauseated).   Avoid alcoholic beverages for 24 hours or as instructed.    MEDICATIONS  You may resume your normal medications.   WHAT YOU CAN EXPECT TODAY  Some  feelings of bloating in the abdomen.   Passage of more gas than usual.   Spotting of blood in your stool or on the toilet paper  .  IF YOU HAD POLYPS REMOVED DURING THE COLONOSCOPY:  Eat a soft diet IF YOU HAVE NAUSEA, BLOATING, ABDOMINAL PAIN, OR VOMITING.    FINDING OUT THE RESULTS OF YOUR TEST Not all test results are available during your visit. DR. Oneida Alar WILL CALL YOU WITHIN 14 DAYS OF YOUR PROCEDUE WITH YOUR RESULTS. Do not assume everything is normal if you have not heard from DR. Mylisa Brunson, CALL HER OFFICE AT [phone].  SEEK IMMEDIATE MEDICAL ATTENTION AND CALL THE OFFICE: [phone] IF:  You have more than a spotting of blood in your stool.   Your belly is swollen (abdominal distention).   You are nauseated or vomiting.   You have a temperature over 101F.   You have abdominal pain or discomfort that is severe or gets worse throughout the day.   High-Fiber Diet A high-fiber diet changes your normal diet to include more whole grains, legumes, fruits, and vegetables. Changes in the diet involve replacing refined carbohydrates with unrefined foods. The calorie level of the diet is essentially unchanged. The Dietary Reference Intake (recommended amount) for adult males is 38 grams per day. For adult females, it is 25 grams per day. Pregnant and lactating women should consume 28 grams of fiber per day. Fiber is the intact part of a plant that  is not broken down during digestion. Functional fiber is fiber that has been isolated from the plant to provide a beneficial effect in the body. PURPOSE  Increase stool bulk.   Ease and regulate bowel movements.   Lower cholesterol.   REDUCE RISK OF COLON CANCER  INDICATIONS THAT YOU NEED MORE FIBER  Constipation and hemorrhoids.   Uncomplicated diverticulosis (intestine condition) and irritable bowel syndrome.   Weight management.   As a protective measure against hardening of the arteries (atherosclerosis), diabetes, and  cancer.   GUIDELINES FOR INCREASING FIBER IN THE DIET  Start adding fiber to the diet slowly. A gradual increase of about 5 more grams (2 slices of whole-wheat bread, 2 servings of most fruits or vegetables, or 1 bowl of high-fiber cereal) per day is best. Too rapid an increase in fiber may result in constipation, flatulence, and bloating.   Drink enough water and fluids to keep your urine clear or pale yellow. Water, juice, or caffeine-free drinks are recommended. Not drinking enough fluid may cause constipation.   Eat a variety of high-fiber foods rather than one type of fiber.   Try to increase your intake of fiber through using high-fiber foods rather than fiber pills or supplements that contain small amounts of fiber.   The goal is to change the types of food eaten. Do not supplement your present diet with high-fiber foods, but replace foods in your present diet.   INCLUDE A VARIETY OF FIBER SOURCES  Replace refined and processed grains with whole grains, canned fruits with fresh fruits, and incorporate other fiber sources. White rice, white breads, and most bakery goods contain little or no fiber.   Brown whole-grain rice, buckwheat oats, and many fruits and vegetables are all good sources of fiber. These include: broccoli, Brussels sprouts, cabbage, cauliflower, beets, sweet potatoes, white potatoes (skin on), carrots, tomatoes, eggplant, squash, berries, fresh fruits, and dried fruits.   Cereals appear to be the richest source of fiber. Cereal fiber is found in whole grains and bran. Bran is the fiber-rich outer coat of cereal grain, which is largely removed in refining. In whole-grain cereals, the bran remains. In breakfast cereals, the largest amount of fiber is found in those with "bran" in their names. The fiber content is sometimes indicated on the label.   You may need to include additional fruits and vegetables each day.   In baking, for 1 cup white flour, you may use the  following substitutions:   1 cup whole-wheat flour minus 2 tablespoons.   1/2 cup white flour plus 1/2 cup whole-wheat flour.   Polyps, Colon  A polyp is extra tissue that grows inside your body. Colon polyps grow in the large intestine. The large intestine, also called the colon, is part of your digestive system. It is a long, hollow tube at the end of your digestive tract where your body makes and stores stool. Most polyps are not dangerous. They are benign. This means they are not cancerous. But over time, some types of polyps can turn into cancer. Polyps that are smaller than a pea are usually not harmful. But larger polyps could someday become or may already be cancerous. To be safe, doctors remove all polyps and test them.   PREVENTION There is not one sure way to prevent polyps. You might be able to lower your risk of getting them if you:  Eat more fruits and vegetables and less fatty food.   Do not smoke.   Avoid alcohol.  Exercise every day.   Lose weight if you are overweight.   Eating more calcium and folate can also lower your risk of getting polyps. Some foods that are rich in calcium are milk, cheese, and broccoli. Some foods that are rich in folate are chickpeas, kidney beans, and spinach.   Hemorrhoids Hemorrhoids are dilated (enlarged) veins around the rectum. Sometimes clots will form in the veins. This makes them swollen and painful. These are called thrombosed hemorrhoids. Causes of hemorrhoids include:  Constipation.   Straining to have a bowel movement.   HEAVY LIFTING  HOME CARE INSTRUCTIONS  Eat a well balanced diet and drink 6 to 8 glasses of water every day to avoid constipation. You may also use a bulk laxative.   Avoid straining to have bowel movements.   Keep anal area dry and clean.   Do not use a donut shaped pillow or sit on the toilet for long periods. This increases blood pooling and pain.   Move your bowels when your body has the urge;  this will require less straining and will decrease pain and pressure.

## 2016-04-13 DIAGNOSIS — B0229 Other postherpetic nervous system involvement: Secondary | ICD-10-CM | POA: Diagnosis not present

## 2016-04-13 DIAGNOSIS — K635 Polyp of colon: Secondary | ICD-10-CM | POA: Diagnosis not present

## 2016-04-14 ENCOUNTER — Telehealth: Payer: Self-pay | Admitting: Cardiovascular Disease

## 2016-04-14 NOTE — Telephone Encounter (Signed)
Numerous attempts to contact patient with recall letters with no success.   03/17/2015 1:30 PM New [10]     [System] 06/10/2015 11:03 PM Notification Sent [20]   Weston Anna [id number] 12/15/2015 2:07 PM Notification Sent [20]   Lynnda Child Slaughter [id number] 04/07/2016 3:31 PM Notification Sent [20]   Lynnda Child Slaughter [id number] 04/14/2016 5:04 PM Notification Sent [20]

## 2016-04-15 ENCOUNTER — Encounter (HOSPITAL_COMMUNITY): Payer: Self-pay | Admitting: Gastroenterology

## 2016-04-26 ENCOUNTER — Telehealth: Payer: Self-pay | Admitting: Gastroenterology

## 2016-04-26 NOTE — Telephone Encounter (Signed)
Pt is aware of results. 

## 2016-04-26 NOTE — Telephone Encounter (Signed)
  Please call pt. She had FIVE HYPERPLASTIC POLYPS removed.   CONTINUE YOUR WEIGHT LOSS EFFORTS. LOSE TEN POUNDS.  DRINK WATER TO KEEP YOUR URINE LIGHT YELLOW.  FOLLOW A HIGH FIBER DIET. AVOID ITEMS THAT CAUSE BLOATING & GAS.   USE PREPARATION H FOUR TIMES  A DAY IF NEEDED TO RELIEVE RECTAL PAIN/PRESSURE/BLEEDING.  Next colonoscopy in 10 years.

## 2016-05-17 ENCOUNTER — Encounter: Payer: Self-pay | Admitting: Gastroenterology

## 2016-05-18 ENCOUNTER — Ambulatory Visit: Payer: PPO | Admitting: Urology

## 2016-09-08 ENCOUNTER — Ambulatory Visit (INDEPENDENT_AMBULATORY_CARE_PROVIDER_SITE_OTHER): Payer: PPO | Admitting: Otolaryngology

## 2016-09-08 DIAGNOSIS — F17210 Nicotine dependence, cigarettes, uncomplicated: Secondary | ICD-10-CM | POA: Diagnosis not present

## 2016-09-08 DIAGNOSIS — R490 Dysphonia: Secondary | ICD-10-CM

## 2016-09-08 DIAGNOSIS — J382 Nodules of vocal cords: Secondary | ICD-10-CM

## 2016-10-13 DIAGNOSIS — Z6828 Body mass index (BMI) 28.0-28.9, adult: Secondary | ICD-10-CM | POA: Diagnosis not present

## 2016-10-13 DIAGNOSIS — G629 Polyneuropathy, unspecified: Secondary | ICD-10-CM | POA: Diagnosis not present

## 2016-10-13 DIAGNOSIS — R300 Dysuria: Secondary | ICD-10-CM | POA: Diagnosis not present

## 2016-10-20 DIAGNOSIS — Z0000 Encounter for general adult medical examination without abnormal findings: Secondary | ICD-10-CM | POA: Diagnosis not present

## 2016-11-14 ENCOUNTER — Ambulatory Visit (INDEPENDENT_AMBULATORY_CARE_PROVIDER_SITE_OTHER): Payer: PPO | Admitting: Otolaryngology

## 2016-11-14 DIAGNOSIS — R490 Dysphonia: Secondary | ICD-10-CM | POA: Diagnosis not present

## 2016-11-14 DIAGNOSIS — F17210 Nicotine dependence, cigarettes, uncomplicated: Secondary | ICD-10-CM

## 2016-11-14 DIAGNOSIS — K219 Gastro-esophageal reflux disease without esophagitis: Secondary | ICD-10-CM

## 2016-11-22 DIAGNOSIS — Z1159 Encounter for screening for other viral diseases: Secondary | ICD-10-CM | POA: Diagnosis not present

## 2016-11-22 DIAGNOSIS — E039 Hypothyroidism, unspecified: Secondary | ICD-10-CM | POA: Diagnosis not present

## 2016-11-22 DIAGNOSIS — E559 Vitamin D deficiency, unspecified: Secondary | ICD-10-CM | POA: Diagnosis not present

## 2017-03-20 ENCOUNTER — Ambulatory Visit (INDEPENDENT_AMBULATORY_CARE_PROVIDER_SITE_OTHER): Payer: PPO | Admitting: Otolaryngology

## 2017-03-20 DIAGNOSIS — K219 Gastro-esophageal reflux disease without esophagitis: Secondary | ICD-10-CM

## 2017-03-20 DIAGNOSIS — R490 Dysphonia: Secondary | ICD-10-CM | POA: Diagnosis not present

## 2017-03-20 DIAGNOSIS — J382 Nodules of vocal cords: Secondary | ICD-10-CM

## 2017-03-22 ENCOUNTER — Encounter: Payer: Self-pay | Admitting: Gastroenterology

## 2017-04-12 ENCOUNTER — Ambulatory Visit: Payer: PPO | Admitting: Gastroenterology

## 2017-04-12 ENCOUNTER — Encounter: Payer: Self-pay | Admitting: Gastroenterology

## 2017-04-12 DIAGNOSIS — K219 Gastro-esophageal reflux disease without esophagitis: Secondary | ICD-10-CM | POA: Diagnosis not present

## 2017-04-12 MED ORDER — PANTOPRAZOLE SODIUM 40 MG PO TBEC: 40.0000 mg | DELAYED_RELEASE_TABLET | Freq: Every day | ORAL | 1 refills | Status: DC

## 2017-04-12 NOTE — Progress Notes (Signed)
Referring Provider: Celene Squibb, MD Primary Care Physician:  Celene Squibb, MD Primary GI: Dr. Oneida Alar   Chief Complaint  Patient presents with  . Gastroesophageal Reflux    HPI:   Lori Lopez is a 66 y.o. female presenting today with a history of constipation, last colonoscopy in Dec 2017 with multiple hyperplastic polyps. Surveillance due in 2027. Referred back to [place] due to GERD exacerbation.   Started having vocal hoarseness in July. Went to ENT, Dr. Benjamine Mola, and had what sounds like a laryngoscopy. Told to watch what she eats, to stop smoking (she is 3 weeks smoke-free as of today). Went back in follow-up and was told it was about the same. Taking Protonix 20 mg daily and zantac at bedtime. Elevated head of bed. Working on not eating late. No dysphagia. No NSAIDs. No alcohol. Down to decaf products. No sodas. No constipation.   Past Medical History:  Diagnosis Date  . Abnormal bone density screening 09/24/2009  . Adjustment disorder with depressed mood 09/08/2009  . Allergic rhinitis due to pollen 09/08/2009  . Anxiety   . Anxiety disorder 10/25/2010  . Cervicalgia 09/08/2009  . Chest discomfort 10/10/14  . Cigarette nicotine dependence, uncomplicated 5/80/9983  . COPD (chronic obstructive pulmonary disease) ([place]) 11/05/2010  . Degeneration of thoracolumbar intervertebral disc 06/13/2013  . 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 Epigastric pain   . Hypothyroidism   . Mixed hyperlipidemia 05/15/2013  . Obesity due to excess calories 05/15/2013  . Pain in thoracic spine 09/08/2009  . Tinea unguium 05/15/2013  . Vitamin D deficiency 12/28/2009    Past Surgical History:  Procedure Laterality Date  . BACK SURGERY    . COLONOSCOPY  2008   Dr. Oneida Alar: normal colon, small internal hemorrhoids.   . COLONOSCOPY N/A 04/08/2016   Dr. Oneida Alar: examined portion of ileum normal. two 5-6 mm polyps in descending colon and transverse colon, three 2-4 mm polyps in rectum, (all  hyperplastic), internal and external hemorrhoids. Repeat in 10 years    Current Outpatient Medications  Medication Sig Dispense Refill  . acetaminophen-codeine (TYLENOL #3) 300-30 MG tablet Take 1-2 tablets by mouth every 6 (six) hours as needed for moderate pain. 30 tablet 0  . ALPRAZolam (XANAX) 1 MG tablet Take 0.5 mg by mouth daily as needed for anxiety.    Marland Kitchen aspirin 81 MG tablet Take 81 mg by mouth daily.    . Cholecalciferol (VITAMIN D3) 400 UNITS CAPS Take 1 tablet by mouth 2 (two) times daily.     . diphenhydrAMINE (BENADRYL) 25 mg capsule Take 25 mg by mouth daily as needed for itching.    . gabapentin (NEURONTIN) 300 MG capsule Take 1 capsule (300 mg total) by mouth 3 (three) times daily. 1 pill first day. 1 pill twice a day on the second day, then tid (Patient taking differently: Take 300 mg by mouth 2 (two) times daily. 1 pill first day. 1 pill twice a day on the second day, then tid) 20 capsule 0  . nitroGLYCERIN (NITROSTAT) 0.4 MG SL tablet Place 1 tablet (0.4 mg total) under the tongue every 5 (five) minutes as needed for chest pain. 25 tablet 3  . pantoprazole (PROTONIX) 20 MG tablet Take 1 tablet by mouth daily.    . ranitidine (ZANTAC) 150 MG tablet Take 150 mg by mouth at bedtime.     No current facility-administered medications for this visit.     Allergies as of 04/12/2017 - Review Complete 04/12/2017  Allergen Reaction Noted  . Shrimp [shellfish allergy] Anaphylaxis 10/10/2014  . Hydrocodone Hives and Itching 10/10/2014  . Lyrica [pregabalin] Other (See Comments) 10/10/2014    Family History  Problem Relation Age of Onset  . Diabetes Mother   . CAD Mother   . Heart disease Mother   . CAD Father   . Colon cancer Neg Hx   . Colon polyps Neg Hx     Social History   Socioeconomic History  . Marital status: Married    Spouse name: None  . Number of children: None  . Years of education: None  . Highest education level: None  Social Needs  . Financial  resource strain: None  . Food insecurity - worry: None  . Food insecurity - inability: None  . Transportation needs - medical: None  . Transportation needs - non-medical: None  Occupational History  . None  Tobacco Use  . Smoking status: Former Smoker    Types: Cigarettes  . Smokeless tobacco: Never Used  . Tobacco comment: quit 3 weeks ago  Substance and Sexual Activity  . Alcohol use: No    Alcohol/week: 0.0 oz  . Drug use: No  . Sexual activity: None  Other Topics Concern  . None  Social History Narrative  . None    Review of Systems: Gen: Denies fever, chills, anorexia. Denies fatigue, weakness, weight loss.  CV: Denies chest pain, palpitations, syncope, peripheral edema, and claudication. Resp: Denies dyspnea at rest, cough, wheezing, coughing up blood, and pleurisy. GI: see HPI  Derm: Denies rash, itching, dry skin Psych: Denies depression, anxiety, memory loss, confusion. No homicidal or suicidal ideation.  Heme: Denies bruising, bleeding, and enlarged lymph nodes.  Physical Exam: BP 127/75   Pulse 83   Temp (!) 97.4 F (36.3 C) (Oral)   Ht 5\' 2"  (1.575 m)   Wt 167 lb 9.6 oz (76 kg)   BMI 30.65 kg/m  General:   Alert and oriented. No distress noted. Pleasant and cooperative.  Head:  Normocephalic and atraumatic. Eyes:  Conjuctiva clear without scleral icterus. Mouth:  Oral mucosa pink and moist.  Abdomen:  +BS, soft, non-tender and non-distended. No rebound or guarding. No HSM or masses noted. Msk:  Symmetrical without gross deformities. Normal posture. Extremities:  Without edema. Neurologic:  Alert and  oriented x4 Psych:  Alert and cooperative. Normal mood and affect.

## 2017-04-12 NOTE — Progress Notes (Signed)
cc'd to pcp 

## 2017-04-12 NOTE — Assessment & Plan Note (Signed)
66 year old female with vocal hoarseness, intermittent reflux, and has been evaluated by ENT. 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 Will retrieve notes from Dr. Benjamine Mola. She is on Protonix 20 mg once daily. Will increase this to 40 mg daily, Zantac prn each evening, and strict dietary/behavior modifications. She has no alarm symptoms. Return in 2 months or sooner if needed. She may need a short course of BID dosing vs change in PPI.

## 2017-04-12 NOTE — Patient Instructions (Addendum)
I have increased the dosage of Protonix. It is now 40 milligrams. Take it once each day, 30 minutes before breakfast on an empty stomach. You may take Zantac as needed at bedtime.   I have included the reflux handout. Continue the great lifestyle changes you are doing!!!   I will see you in 2 months. Let me know if you have any worsening of symptoms or no improvement in the meantime.    Food Choices for Gastroesophageal Reflux Disease, Adult When you have gastroesophageal reflux disease (GERD), the foods you eat and your eating habits are very important. Choosing the right foods can help ease your discomfort. What guidelines do I need to follow?  Choose fruits, vegetables, whole grains, and low-fat dairy products.  Choose low-fat meat, fish, and poultry.  Limit fats such as oils, salad dressings, butter, nuts, and avocado.  Keep a food diary. This helps you identify foods that cause symptoms.  Avoid foods that cause symptoms. These may be different for everyone.  Eat small meals often instead of 3 large meals a day.  Eat your meals slowly, in a place where you are relaxed.  Limit fried foods.  Cook foods using methods other than frying.  Avoid drinking alcohol.  Avoid drinking large amounts of liquids with your meals.  Avoid bending over or lying down until 2-3 hours after eating. What foods are not recommended? These are some foods and drinks that may make your symptoms worse: Vegetables Tomatoes. Tomato juice. Tomato and spaghetti sauce. Chili peppers. Onion and garlic. Horseradish. Fruits Oranges, grapefruit, and lemon (fruit and juice). Meats High-fat meats, fish, and poultry. This includes hot dogs, ribs, ham, sausage, salami, and bacon. Dairy Whole milk and chocolate milk. Sour cream. Cream. Butter. Ice cream. Cream cheese. Drinks Coffee and tea. Bubbly (carbonated) drinks or energy drinks. Condiments Hot sauce. Barbecue sauce. Sweets/Desserts Chocolate and  cocoa. Donuts. Peppermint and spearmint. Fats and Oils High-fat foods. This includes [place] fries and potato chips. Other Vinegar. Strong spices. This includes black pepper, white pepper, red pepper, cayenne, curry powder, cloves, ginger, and chili powder. The items listed above may not be a complete list of foods and drinks to avoid. Contact your dietitian for more information. This information is not intended to replace advice given to you by your health care provider. Make sure you discuss any questions you have with your health care provider. Document Released: 10/25/2011 Document Revised: 10/01/2015 Document Reviewed: 02/27/2013 Elsevier Interactive Patient Education  2017 [employer].

## 2017-04-25 DIAGNOSIS — K219 Gastro-esophageal reflux disease without esophagitis: Secondary | ICD-10-CM | POA: Diagnosis not present

## 2017-04-25 DIAGNOSIS — F411 Generalized anxiety disorder: Secondary | ICD-10-CM | POA: Diagnosis not present

## 2017-04-25 DIAGNOSIS — Z0000 Encounter for general adult medical examination without abnormal findings: Secondary | ICD-10-CM | POA: Diagnosis not present

## 2017-04-25 DIAGNOSIS — G629 Polyneuropathy, unspecified: Secondary | ICD-10-CM | POA: Diagnosis not present

## 2017-04-27 DIAGNOSIS — K219 Gastro-esophageal reflux disease without esophagitis: Secondary | ICD-10-CM | POA: Diagnosis not present

## 2017-04-27 DIAGNOSIS — E875 Hyperkalemia: Secondary | ICD-10-CM | POA: Diagnosis not present

## 2017-04-27 DIAGNOSIS — E782 Mixed hyperlipidemia: Secondary | ICD-10-CM | POA: Diagnosis not present

## 2017-04-27 DIAGNOSIS — G629 Polyneuropathy, unspecified: Secondary | ICD-10-CM | POA: Diagnosis not present

## 2017-06-14 ENCOUNTER — Ambulatory Visit: Payer: PPO | Admitting: Gastroenterology

## 2017-08-04 ENCOUNTER — Ambulatory Visit: Payer: PPO | Admitting: Gastroenterology

## 2017-10-19 ENCOUNTER — Ambulatory Visit: Payer: PPO | Admitting: Gastroenterology

## 2017-10-19 ENCOUNTER — Encounter: Payer: Self-pay | Admitting: Gastroenterology

## 2017-10-19 ENCOUNTER — Other Ambulatory Visit: Payer: Self-pay

## 2017-10-19 ENCOUNTER — Telehealth: Payer: Self-pay

## 2017-10-19 VITALS — BP 144/72 | HR 82 | Temp 97.3°F | Ht 62.0 in | Wt 162.2 lb

## 2017-10-19 DIAGNOSIS — R1310 Dysphagia, unspecified: Secondary | ICD-10-CM

## 2017-10-19 DIAGNOSIS — K219 Gastro-esophageal reflux disease without esophagitis: Secondary | ICD-10-CM | POA: Diagnosis not present

## 2017-10-19 MED ORDER — PANTOPRAZOLE SODIUM 40 MG PO TBEC: 40.0000 mg | DELAYED_RELEASE_TABLET | Freq: Two times a day (BID) | ORAL | 3 refills | Status: DC

## 2017-10-19 NOTE — Patient Instructions (Signed)
I have increased Protonix to twice a day, 30 minutes before breakfast and dinner.  We have arranged an upper endoscopy with dilation by Dr. Oneida Alar in the near future.  We will see you in 3-4 months after this!  Continue the dietary and behavior changes as we discussed in the meantime.  It was a pleasure to see you today. I strive to create trusting relationships with patients to provide genuine, compassionate, and quality care. I value your feedback. If you receive a survey regarding your visit,  I greatly appreciate you taking time to fill this out.   Annitta Needs, PhD, ANP-BC [facility]

## 2017-10-19 NOTE — Progress Notes (Addendum)
REVIEWED-NO ADDITIONAL RECOMMENDATIONS.  Referring Provider: Celene Squibb, MD Primary Care Physician:  Celene Squibb, MD  Chief Complaint  Patient presents with  . Gastroesophageal Reflux    "lots of burping with certain foods"    HPI:   Lori Lopez is a 67 y.o. female presenting today with a history of constipation, GERD last colonoscopy in Dec 2017 with multiple hyperplastic polyps. Surveillance due in 2027.Last seen in Dec 2018. Due to vocal hoarseness, she was evaluated by ENT. Quit smoking. I increased Protonix to 40 mg daily instead of 20 mg in Dec 2018, had her take Zantac prn in the evening, and continue dietary/behavior modifications. ENT has seen on several occasions, most recently in Nov 2018. 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 Consideration for referral to [facility] voice lab for Reinke's edema treatment.   Continued hoarseness, worse in the morning. Has a lot of sinus issues. Recently noted solid food dysphagia. Globus sensation. For most part tries to stick with appropriate GERD diet. Vaping occasionally, still smoking cigarettes at times. States "sometimes falling off the wagon". Admits she enjoys chocolate ice cream at night but knows this is not helpful for GERD symptoms. 3 cups of coffee this morning. She has raised the HOB on risers.   Past Medical History:  Diagnosis Date  . Abnormal bone density screening 09/24/2009  . Adjustment disorder with depressed mood 09/08/2009  . Allergic rhinitis due to pollen 09/08/2009  . Anxiety   . Anxiety disorder 10/25/2010  . Cervicalgia 09/08/2009  . Chest discomfort 10/10/14  . Cigarette nicotine dependence, uncomplicated 1/61/0960  . COPD (chronic obstructive pulmonary disease) ([place]) 11/05/2010  . Degeneration of thoracolumbar intervertebral disc 06/13/2013  . 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 Epigastric pain   . Hypothyroidism   . Mixed  hyperlipidemia 05/15/2013  . Obesity due to excess calories 05/15/2013  . Pain in thoracic spine 09/08/2009  . Tinea unguium 05/15/2013  . Vitamin D deficiency 12/28/2009    Past Surgical History:  Procedure Laterality Date  . BACK SURGERY    . COLONOSCOPY  2008   Dr. Oneida Alar: normal colon, small internal hemorrhoids.   . COLONOSCOPY N/A 04/08/2016   Dr. Oneida Alar: examined portion of ileum normal. two 5-6 mm polyps in descending colon and transverse colon, three 2-4 mm polyps in rectum, (all hyperplastic), internal and external hemorrhoids. Repeat in 10 years    Current Outpatient Medications  Medication Sig Dispense Refill  . acetaminophen-codeine (TYLENOL #3) 300-30 MG tablet Take 1-2 tablets by mouth every 6 (six) hours as needed for moderate pain. 30 tablet 0  . ALPRAZolam (XANAX) 1 MG tablet Take 0.5 mg by mouth daily as needed for anxiety.    Marland Kitchen aspirin 81 MG tablet Take 81 mg by mouth daily.    . Cholecalciferol (VITAMIN D3) 400 UNITS CAPS Take 1 tablet by mouth daily.     . diphenhydrAMINE (BENADRYL) 25 mg capsule Take 25 mg by mouth daily as needed for itching.    . gabapentin (NEURONTIN) 300 MG capsule Take 1 capsule (300 mg total) by mouth 3 (three) times daily. 1 pill first day. 1 pill twice a day on the second day, then tid (Patient taking differently: Take 300 mg by mouth as needed. 1 pill first day. 1 pill twice a day on the second day, then tid) 20 capsule 0  . nitroGLYCERIN (NITROSTAT) 0.4 MG SL tablet Place 1 tablet (0.4 mg total) under the tongue every  5 (five) minutes as needed for chest pain. 25 tablet 3  . pantoprazole (PROTONIX) 40 MG tablet Take 1 tablet (40 mg total) by mouth daily. Take 30 minutes before breakfast 90 tablet 1  . ranitidine (ZANTAC) 150 MG tablet Take 150 mg by mouth at bedtime.     No current facility-administered medications for this visit.     Allergies as of 10/19/2017 - Review Complete 10/19/2017  Allergen Reaction Noted  . Shrimp [shellfish allergy]  Anaphylaxis 10/10/2014  . Hydrocodone Hives and Itching 10/10/2014  . Lyrica [pregabalin] Other (See Comments) 10/10/2014    Family History  Problem Relation Age of Onset  . Diabetes Mother   . CAD Mother   . Heart disease Mother   . CAD Father   . Colon cancer Neg Hx   . Colon polyps Neg Hx     Social History   Socioeconomic History  . Marital status: Married    Spouse name: Not on file  . Number of children: Not on file  . Years of education: Not on file  . Highest education level: Not on file  Occupational History  . Not on file  Social Needs  . Financial resource strain: Not on file  . Food insecurity:    Worry: Not on file    Inability: Not on file  . Transportation needs:    Medical: Not on file    Non-medical: Not on file  Tobacco Use  . Smoking status: Current Every Day Smoker    Types: Cigarettes  . Smokeless tobacco: Never Used  . Tobacco comment: 1 pack per week  Substance and Sexual Activity  . Alcohol use: No    Alcohol/week: 0.0 oz  . Drug use: No  . Sexual activity: Not on file  Lifestyle  . Physical activity:    Days per week: Not on file    Minutes per session: Not on file  . Stress: Not on file  Relationships  . Social connections:    Talks on phone: Not on file    Gets together: Not on file    Attends religious service: Not on file    Active member of club or organization: Not on file    Attends meetings of clubs or organizations: Not on file    Relationship status: Not on file  Other Topics Concern  . Not on file  Social History Narrative  . Not on file    Review of Systems: Gen: Denies fever, chills, anorexia. Denies fatigue, weakness, weight loss.  CV: Denies chest pain, palpitations, syncope, peripheral edema, and claudication. Resp: Denies dyspnea at rest, cough, wheezing, coughing up blood, and pleurisy. GI: see HPI  Derm: Denies rash, itching, dry skin Psych: Denies depression, anxiety, memory loss, confusion. No homicidal  or suicidal ideation.  Heme: Denies bruising, bleeding, and enlarged lymph nodes.  Physical Exam: BP (!) 144/72   Pulse 82   Temp (!) 97.3 F (36.3 C) (Oral)   Ht 5\' 2"  (1.575 m)   Wt 162 lb 3.2 oz (73.6 kg)   BMI 29.67 kg/m  General:   Alert and oriented. No distress noted. Pleasant and cooperative.  Head:  Normocephalic and atraumatic. Eyes:  Conjuctiva clear without scleral icterus. Mouth:  Oral mucosa pink and moist.  Lungs: mild expiratory wheeze bilaterally Cardiac: S1 S2 present without murmurs Abdomen:  +BS, soft, non-tender and non-distended. No rebound or guarding. No HSM or masses noted. Msk:  Symmetrical without gross deformities. Normal posture. Extremities:  Without edema.  Neurologic:  Alert and  oriented x4 Psych:  Alert and cooperative. Normal mood and affect.

## 2017-10-19 NOTE — Telephone Encounter (Signed)
Tried to call pt to inform of pre-op appt 11/01/17 at 2:45pm, no answer, LMOVM. Letter mailed.

## 2017-10-19 NOTE — Progress Notes (Signed)
CC'D TO PCP °

## 2017-10-19 NOTE — Assessment & Plan Note (Signed)
Likely due to GERD, esophagitis, possible ring, stricture. Doubt malignancy. Dilation at time of EGD as appropriate.

## 2017-10-19 NOTE — Assessment & Plan Note (Signed)
67 year old female with persistent GERD despite PPI daily. She also has chronic hoarseness with evaluation by ENT as above: Reinke's edema noted. Possible referral to [facility] per [facility]NT in future. Now noting globus sensation and new onset dysphagia. No prior EGD. GERD exacerbation multifactorial in setting of diet, behavior.   Proceed with upper endoscopy/dilation in the near future with Dr. Oneida Alar. The risks, benefits, and alternatives have been discussed in detail with patient. They have stated understanding and desire to proceed.  Propofol due to polypharmacy Smoking cessation, GERD dietary/behavior modifications again discussed Increase PPI to BID for now Continue follow-up with ENT

## 2017-10-30 NOTE — Patient Instructions (Signed)
Lori Lopez  10/30/2017     @PREFPERIOPPHARMACY @   Your procedure is scheduled on  11/07/2017 .  Report to [facility] at  1130   A.M.  Call this number if you have problems the morning of surgery:  [phone]   Remember:  Do not eat or drink after midnight.  You may drink clear liquids until  (follow the instructions given to you) .  Clear liquids allowed are:                    Water, Juice (non-citric and without pulp), Carbonated beverages, Clear Tea, Black Coffee only, Plain Jell-O only, Gatorade and Plain Popsicles only    Take these medicines the morning of surgery with A SIP OF WATER  Tylenol #3, xanax, neurontin, protonix.    Do not wear jewelry, make-up or nail polish.  Do not wear lotions, powders, or perfumes, or deodorant.  Do not shave 48 hours prior to surgery.  Men may shave face and neck.  Do not bring valuables to the hospital.  [facility] is not responsible for any belongings or valuables.  Contacts, dentures or bridgework may not be worn into surgery.  Leave your suitcase in the car.  After surgery it may be brought to your room.  For patients admitted to the hospital, discharge time will be determined by your treatment team.  Patients discharged the day of surgery will not be allowed to drive home.   Name and phone number of your driver:   family Special instructions:  Follow the diet and prep instructions given to you by Dr Nona Dell office.  Please read over the following fact sheets that you were given. Anesthesia Post-op Instructions and Care and Recovery After Surgery       Esophagogastroduodenoscopy Esophagogastroduodenoscopy (EGD) is a procedure to examine the lining of the esophagus, stomach, and first part of the small intestine (duodenum). This procedure is done to check for problems such as inflammation, bleeding, ulcers, or growths. During this procedure, a long, flexible, lighted tube with a camera attached (endoscope)  is inserted down the throat. Tell a health care provider about:  Any allergies you have.  All medicines you are taking, including vitamins, herbs, eye drops, creams, and over-the-counter medicines.  Any problems you or family members have had with anesthetic medicines.  Any blood disorders you have.  Any surgeries you have had.  Any medical conditions you have.  Whether you are pregnant or may be pregnant. What are the risks? Generally, this is a safe procedure. However, problems may occur, including:  Infection.  Bleeding.  A tear (perforation) in the esophagus, stomach, or duodenum.  Trouble breathing.  Excessive sweating.  Spasms of the larynx.  A slowed heartbeat.  Low blood pressure.  What happens before the procedure?  Follow instructions from your health care provider about eating or drinking restrictions.  Ask your health care provider about: ? Changing or stopping your regular medicines. This is especially important if you are taking diabetes medicines or blood thinners. ? Taking medicines such as aspirin and ibuprofen. These medicines can thin your blood. Do not take these medicines before your procedure if your health care provider instructs you not to.  Plan to have someone take you home after the procedure.  If you wear dentures, be ready to remove them before the procedure. What happens during the procedure?  To reduce your risk of infection, your  health care team will wash or sanitize their hands.  An IV tube will be put in a vein in your hand or arm. You will get medicines and fluids through this tube.  You will be given one or more of the following: ? A medicine to help you relax (sedative). ? A medicine to numb the area (local anesthetic). This medicine may be sprayed into your throat. It will make you feel more comfortable and keep you from gagging or coughing during the procedure. ? A medicine for pain.  A mouth guard may be placed in your  mouth to protect your teeth and to keep you from biting on the endoscope.  You will be asked to lie on your left side.  The endoscope will be lowered down your throat into your esophagus, stomach, and duodenum.  Air will be put into the endoscope. This will help your health care provider see better.  The lining of your esophagus, stomach, and duodenum will be examined.  Your health care provider may: ? Take a tissue sample so it can be looked at in a lab (biopsy). ? Remove growths. ? Remove objects (foreign bodies) that are stuck. ? Treat any bleeding with medicines or other devices that stop tissue from bleeding. ? Widen (dilate) or stretch narrowed areas of your esophagus and stomach.  The endoscope will be taken out. The procedure may vary among health care providers and hospitals. What happens after the procedure?  Your blood pressure, heart rate, breathing rate, and blood oxygen level will be monitored often until the medicines you were given have worn off.  Do not eat or drink anything until the numbing medicine has worn off and your gag reflex has returned. This information is not intended to replace advice given to you by your health care provider. Make sure you discuss any questions you have with your health care provider. Document Released: 08/26/2004 Document Revised: 10/01/2015 Document Reviewed: 03/19/2015 Elsevier Interactive Patient Education  2018 [employer]. Esophagogastroduodenoscopy, Care After Refer to this sheet in the next few weeks. These instructions provide you with information about caring for yourself after your procedure. Your health care provider may also give you more specific instructions. Your treatment has been planned according to current medical practices, but problems sometimes occur. Call your health care provider if you have any problems or questions after your procedure. What can I expect after the procedure? After the procedure, it is common to  have:  A sore throat.  Nausea.  Bloating.  Dizziness.  Fatigue.  Follow these instructions at home:  Do not eat or drink anything until the numbing medicine (local anesthetic) has worn off and your gag reflex has returned. You will know that the local anesthetic has worn off when you can swallow comfortably.  Do not drive for 24 hours if you received a medicine to help you relax (sedative).  If your health care provider took a tissue sample for testing during the procedure, make sure to get your test results. This is your responsibility. Ask your health care provider or the department performing the test when your results will be ready.  Keep all follow-up visits as told by your health care provider. This is important. Contact a health care provider if:  You cannot stop coughing.  You are not urinating.  You are urinating less than usual. Get help right away if:  You have trouble swallowing.  You cannot eat or drink.  You have throat or chest pain that gets  worse.  You are dizzy or light-headed.  You faint.  You have nausea or vomiting.  You have chills.  You have a fever.  You have severe abdominal pain.  You have black, tarry, or bloody stools. This information is not intended to replace advice given to you by your health care provider. Make sure you discuss any questions you have with your health care provider. Document Released: 04/11/2012 Document Revised: 10/01/2015 Document Reviewed: 03/19/2015 Elsevier Interactive Patient Education  2018 [employer].  Esophageal Dilatation Esophageal dilatation is a procedure to open a blocked or narrowed part of the esophagus. The esophagus is the long tube in your throat that carries food and liquid from your mouth to your stomach. The procedure is also called esophageal dilation. You may need this procedure if you have a buildup of scar tissue in your esophagus that makes it difficult, painful, or even impossible to  swallow. This can be caused by gastroesophageal reflux disease (GERD). In rare cases, people need this procedure because they have cancer of the esophagus or a problem with the way food moves through the esophagus. Sometimes you may need to have another dilatation to enlarge the opening of the esophagus gradually. Tell a health care provider about:  Any allergies you have.  All medicines you are taking, including vitamins, herbs, eye drops, creams, and over-the-counter medicines.  Any problems you or family members have had with anesthetic medicines.  Any blood disorders you have.  Any surgeries you have had.  Any medical conditions you have.  Any antibiotic medicines you are required to take before dental procedures. What are the risks? Generally, this is a safe procedure. However, problems can occur and include:  Bleeding from a tear in the lining of the esophagus.  A hole (perforation) in the esophagus.  What happens before the procedure?  Do not eat or drink anything after midnight on the night before the procedure or as directed by your health care provider.  Ask your health care provider about changing or stopping your regular medicines. This is especially important if you are taking diabetes medicines or blood thinners.  Plan to have someone take you home after the procedure. What happens during the procedure?  You will be given a medicine that makes you relaxed and sleepy (sedative).  A medicine may be sprayed or gargled to numb the back of the throat.  Your health care provider can use various instruments to do an esophageal dilatation. During the procedure, the instrument used will be placed in your mouth and passed down into your esophagus. Options include: ? Simple dilators. This instrument is carefully placed in the esophagus to stretch it. ? Guided wire bougies. In this method, a flexible tube (endoscope) is used to insert a wire into the esophagus. The dilator is  passed over this wire to enlarge the esophagus. Then the wire is removed. ? Balloon dilators. An endoscope with a small balloon at the end is passed down into the esophagus. Inflating the balloon gently stretches the esophagus and opens it up. What happens after the procedure?  Your blood pressure, heart rate, breathing rate, and blood oxygen level will be monitored often until the medicines you were given have worn off.  Your throat may feel slightly sore and will probably still feel numb. This will improve slowly over time.  You will not be allowed to eat or drink until the throat numbness has resolved.  If this is a same-day procedure, you may be allowed to  go home once you have been able to drink, urinate, and sit on the edge of the bed without nausea or dizziness.  If this is a same-day procedure, you should have a friend or family member with you for the next 24 hours after the procedure. This information is not intended to replace advice given to you by your health care provider. Make sure you discuss any questions you have with your health care provider. Document Released: 06/16/2005 Document Revised: 10/01/2015 Document Reviewed: 09/04/2013 Elsevier Interactive Patient Education  2018 [place] Anesthesia is a term that refers to techniques, procedures, and medicines that help a person stay safe and comfortable during a medical procedure. Monitored anesthesia care, or sedation, is one type of anesthesia. Your anesthesia specialist may recommend sedation if you will be having a procedure that does not require you to be unconscious, such as:  Cataract surgery.  A dental procedure.  A biopsy.  A colonoscopy.  During the procedure, you may receive a medicine to help you relax (sedative). There are three levels of sedation:  Mild sedation. At this level, you may feel awake and relaxed. You will be able to follow directions.  Moderate sedation. At  this level, you will be sleepy. You may not remember the procedure.  Deep sedation. At this level, you will be asleep. You will not remember the procedure.  The more medicine you are given, the deeper your level of sedation will be. Depending on how you respond to the procedure, the anesthesia specialist may change your level of sedation or the type of anesthesia to fit your needs. An anesthesia specialist will monitor you closely during the procedure. Let your health care provider know about:  Any allergies you have.  All medicines you are taking, including vitamins, herbs, eye drops, creams, and over-the-counter medicines.  Any use of steroids (by mouth or as a cream).  Any problems you or family members have had with sedatives and anesthetic medicines.  Any blood disorders you have.  Any surgeries you have had.  Any medical conditions you have, such as sleep apnea.  Whether you are pregnant or may be pregnant.  Any use of cigarettes, alcohol, or street drugs. What are the risks? Generally, this is a safe procedure. However, problems may occur, including:  Getting too much medicine (oversedation).  Nausea.  Allergic reaction to medicines.  Trouble breathing. If this happens, a breathing tube may be used to help with breathing. It will be removed when you are awake and breathing on your own.  Heart trouble.  Lung trouble.  Before the procedure Staying hydrated Follow instructions from your health care provider about hydration, which may include:  Up to 2 hours before the procedure - you may continue to drink clear liquids, such as water, clear fruit juice, black coffee, and plain tea.  Eating and drinking restrictions Follow instructions from your health care provider about eating and drinking, which may include:  8 hours before the procedure - stop eating heavy meals or foods such as meat, fried foods, or fatty foods.  6 hours before the procedure - stop eating  light meals or foods, such as toast or cereal.  6 hours before the procedure - stop drinking milk or drinks that contain milk.  2 hours before the procedure - stop drinking clear liquids.  Medicines Ask your health care provider about:  Changing or stopping your regular medicines. This is especially important if you are taking diabetes medicines  or blood thinners.  Taking medicines such as aspirin and ibuprofen. These medicines can thin your blood. Do not take these medicines before your procedure if your health care provider instructs you not to.  Tests and exams  You will have a physical exam.  You may have blood tests done to show: ? How well your kidneys and liver are working. ? How well your blood can clot.  General instructions  Plan to have someone take you home from the hospital or clinic.  If you will be going home right after the procedure, plan to have someone with you for 24 hours.  What happens during the procedure?  Your blood pressure, heart rate, breathing, level of pain and overall condition will be monitored.  An IV tube will be inserted into one of your veins.  Your anesthesia specialist will give you medicines as needed to keep you comfortable during the procedure. This may mean changing the level of sedation.  The procedure will be performed. After the procedure  Your blood pressure, heart rate, breathing rate, and blood oxygen level will be monitored until the medicines you were given have worn off.  Do not drive for 24 hours if you received a sedative.  You may: ? Feel sleepy, clumsy, or nauseous. ? Feel forgetful about what happened after the procedure. ? Have a sore throat if you had a breathing tube during the procedure. ? Vomit. This information is not intended to replace advice given to you by your health care provider. Make sure you discuss any questions you have with your health care provider. Document Released: 01/19/2005 Document  Revised: 10/02/2015 Document Reviewed: 08/16/2015 Elsevier Interactive Patient Education  2018 [place], Care After These instructions provide you with information about caring for yourself after your procedure. Your health care provider may also give you more specific instructions. Your treatment has been planned according to current medical practices, but problems sometimes occur. Call your health care provider if you have any problems or questions after your procedure. What can I expect after the procedure? After your procedure, it is common to:  Feel sleepy for several hours.  Feel clumsy and have poor balance for several hours.  Feel forgetful about what happened after the procedure.  Have poor judgment for several hours.  Feel nauseous or vomit.  Have a sore throat if you had a breathing tube during the procedure.  Follow these instructions at home: For at least 24 hours after the procedure:   Do not: ? Participate in activities in which you could fall or become injured. ? Drive. ? Use heavy machinery. ? Drink alcohol. ? Take sleeping pills or medicines that cause drowsiness. ? Make important decisions or sign legal documents. ? Take care of children on your own.  Rest. Eating and drinking  Follow the diet that is recommended by your health care provider.  If you vomit, drink water, juice, or soup when you can drink without vomiting.  Make sure you have little or no nausea before eating solid foods. General instructions  Have a responsible adult stay with you until you are awake and alert.  Take over-the-counter and prescription medicines only as told by your health care provider.  If you smoke, do not smoke without supervision.  Keep all follow-up visits as told by your health care provider. This is important. Contact a health care provider if:  You keep feeling nauseous or you keep vomiting.  You feel light-headed.  You  develop  a rash.  You have a fever. Get help right away if:  You have trouble breathing. This information is not intended to replace advice given to you by your health care provider. Make sure you discuss any questions you have with your health care provider. Document Released: 08/16/2015 Document Revised: 12/16/2015 Document Reviewed: 08/16/2015 Elsevier Interactive Patient Education  [employer].

## 2017-11-01 ENCOUNTER — Encounter (HOSPITAL_COMMUNITY): Payer: Self-pay

## 2017-11-01 ENCOUNTER — Other Ambulatory Visit: Payer: Self-pay

## 2017-11-01 ENCOUNTER — Encounter (HOSPITAL_COMMUNITY)
Admission: RE | Admit: 2017-11-01 | Discharge: 2017-11-01 | Disposition: A | Discharge status: Home / Self Care | Payer: PPO | Source: Ambulatory Visit | Attending: Gastroenterology | Admitting: Gastroenterology

## 2017-11-01 DIAGNOSIS — Z01812 Encounter for preprocedural laboratory examination: Secondary | ICD-10-CM | POA: Insufficient documentation

## 2017-11-01 LAB — CBC WITH DIFFERENTIAL/PLATELET
Basophils Absolute: 0 10*3/uL (ref 0.0–0.1)
Basophils Relative: 0 %
EOS ABS: 0.1 10*3/uL (ref 0.0–0.7)
EOS PCT: 1 %
HCT: 44.7 % (ref 36.0–46.0)
Hemoglobin: 15 g/dL (ref 12.0–15.0)
LYMPHS ABS: 3.4 10*3/uL (ref 0.7–4.0)
LYMPHS PCT: 28 %
MCH: 30.6 pg (ref 26.0–34.0)
MCHC: 33.6 g/dL (ref 30.0–36.0)
MCV: 91.2 fL (ref 78.0–100.0)
MONO ABS: 0.8 10*3/uL (ref 0.1–1.0)
Monocytes Relative: 7 %
Neutro Abs: 7.6 10*3/uL (ref 1.7–7.7)
Neutrophils Relative %: 64 %
PLATELETS: 286 10*3/uL (ref 150–400)
RBC: 4.9 MIL/uL (ref 3.87–5.11)
RDW: 13.7 % (ref 11.5–15.5)
WBC: 12 10*3/uL — ABNORMAL HIGH (ref 4.0–10.5)

## 2017-11-01 LAB — BASIC METABOLIC PANEL
ANION GAP: 7 (ref 5–15)
BUN: 18 mg/dL (ref 8–23)
CALCIUM: 9.1 mg/dL (ref 8.9–10.3)
CO2: 30 mmol/L (ref 22–32)
Chloride: 103 mmol/L (ref 98–111)
Creatinine, Ser: 0.93 mg/dL (ref 0.44–1.00)
GFR calc Af Amer: >60 mL/min (ref >60)
GFR calc non Af Amer: >60 mL/min (ref >60)
Glucose, Bld: 107 mg/dL — ABNORMAL HIGH (ref 70–99)
POTASSIUM: 3.9 mmol/L (ref 3.5–5.1)
Sodium: 140 mmol/L (ref 135–145)

## 2017-11-02 NOTE — Progress Notes (Signed)
PT is aware and said she is not having any problems, no fever or aches.

## 2017-11-02 NOTE — Progress Notes (Signed)
PT is aware.

## 2017-11-02 NOTE — Progress Notes (Signed)
Lori Lopez, this is another result. Please review in Dr. Nona Dell absence. Thanks!

## 2017-11-02 NOTE — Progress Notes (Signed)
Lori Lopez, please review results in Dr. Nona Dell absence. You last saw pt on 10/19/2017.  Thanks!

## 2017-11-07 ENCOUNTER — Ambulatory Visit (HOSPITAL_COMMUNITY)
Admission: RE | Admit: 2017-11-07 | Discharge: 2017-11-07 | Disposition: A | Discharge status: Home / Self Care | Payer: PPO | Source: Ambulatory Visit | Attending: Gastroenterology | Admitting: Gastroenterology

## 2017-11-07 ENCOUNTER — Encounter (HOSPITAL_COMMUNITY): Payer: Self-pay

## 2017-11-07 ENCOUNTER — Encounter (HOSPITAL_COMMUNITY)
Admission: RE | Disposition: A | Discharge status: Home / Self Care | Payer: Self-pay | Source: Ambulatory Visit | Attending: Gastroenterology

## 2017-11-07 ENCOUNTER — Ambulatory Visit (HOSPITAL_COMMUNITY): Payer: PPO | Admitting: Anesthesiology

## 2017-11-07 DIAGNOSIS — K219 Gastro-esophageal reflux disease without esophagitis: Secondary | ICD-10-CM | POA: Diagnosis not present

## 2017-11-07 DIAGNOSIS — E559 Vitamin D deficiency, unspecified: Secondary | ICD-10-CM | POA: Insufficient documentation

## 2017-11-07 DIAGNOSIS — F17210 Nicotine dependence, cigarettes, uncomplicated: Secondary | ICD-10-CM | POA: Diagnosis not present

## 2017-11-07 DIAGNOSIS — K3189 Other diseases of stomach and duodenum: Secondary | ICD-10-CM | POA: Insufficient documentation

## 2017-11-07 DIAGNOSIS — R1310 Dysphagia, unspecified: Secondary | ICD-10-CM | POA: Insufficient documentation

## 2017-11-07 DIAGNOSIS — E782 Mixed hyperlipidemia: Secondary | ICD-10-CM | POA: Diagnosis not present

## 2017-11-07 DIAGNOSIS — K2970 Gastritis, unspecified, without bleeding: Secondary | ICD-10-CM | POA: Diagnosis not present

## 2017-11-07 DIAGNOSIS — F419 Anxiety disorder, unspecified: Secondary | ICD-10-CM | POA: Insufficient documentation

## 2017-11-07 DIAGNOSIS — Z885 Allergy status to narcotic agent status: Secondary | ICD-10-CM | POA: Insufficient documentation

## 2017-11-07 DIAGNOSIS — J449 Chronic obstructive pulmonary disease, unspecified: Secondary | ICD-10-CM | POA: Diagnosis not present

## 2017-11-07 DIAGNOSIS — Z9119 Patient's noncompliance with other medical treatment and regimen: Secondary | ICD-10-CM | POA: Diagnosis not present

## 2017-11-07 DIAGNOSIS — Z8249 Family history of ischemic heart disease and other diseases of the circulatory system: Secondary | ICD-10-CM | POA: Diagnosis not present

## 2017-11-07 DIAGNOSIS — F4321 Adjustment disorder with depressed mood: Secondary | ICD-10-CM | POA: Insufficient documentation

## 2017-11-07 DIAGNOSIS — J384 Edema of larynx: Secondary | ICD-10-CM | POA: Diagnosis not present

## 2017-11-07 DIAGNOSIS — R1013 Epigastric pain: Secondary | ICD-10-CM | POA: Diagnosis not present

## 2017-11-07 DIAGNOSIS — Z79899 Other long term (current) drug therapy: Secondary | ICD-10-CM | POA: Diagnosis not present

## 2017-11-07 DIAGNOSIS — Z888 Allergy status to other drugs, medicaments and biological substances status: Secondary | ICD-10-CM | POA: Insufficient documentation

## 2017-11-07 DIAGNOSIS — Z91013 Allergy to seafood: Secondary | ICD-10-CM | POA: Diagnosis not present

## 2017-11-07 DIAGNOSIS — E039 Hypothyroidism, unspecified: Secondary | ICD-10-CM | POA: Diagnosis not present

## 2017-11-07 HISTORY — PX: BIOPSY: SHX5522

## 2017-11-07 HISTORY — PX: ESOPHAGOGASTRODUODENOSCOPY (EGD) WITH PROPOFOL: SHX5813

## 2017-11-07 HISTORY — PX: SAVORY DILATION: SHX5439

## 2017-11-07 SURGERY — ESOPHAGOGASTRODUODENOSCOPY (EGD) WITH PROPOFOL
Anesthesia: Monitor Anesthesia Care

## 2017-11-07 MED ORDER — LIDOCAINE HCL (PF) 2 % IJ SOLN
INTRAMUSCULAR | Status: AC
  Filled 2017-11-07: qty 30

## 2017-11-07 MED ORDER — CHLORHEXIDINE GLUCONATE CLOTH 2 % EX PADS: 6.0000 | MEDICATED_PAD | Freq: Once | CUTANEOUS | Status: DC

## 2017-11-07 MED ORDER — ARTIFICIAL TEARS OPHTHALMIC OINT
TOPICAL_OINTMENT | OPHTHALMIC | Status: AC
  Filled 2017-11-07: qty 7

## 2017-11-07 MED ORDER — LACTATED RINGERS IV SOLN
INTRAVENOUS | Status: DC
  Administered 2017-11-07: 12:00:00 via INTRAVENOUS

## 2017-11-07 MED ORDER — MIDAZOLAM HCL 5 MG/5ML IJ SOLN
INTRAMUSCULAR | Status: DC | PRN
  Administered 2017-11-07: 2 mg via INTRAVENOUS

## 2017-11-07 MED ORDER — MEPERIDINE HCL 100 MG/ML IJ SOLN: 6.2500 mg | INTRAMUSCULAR | Status: DC | PRN

## 2017-11-07 MED ORDER — MIDAZOLAM HCL 2 MG/2ML IJ SOLN
INTRAMUSCULAR | Status: AC
  Filled 2017-11-07: qty 2

## 2017-11-07 MED ORDER — LIDOCAINE VISCOUS HCL 2 % MT SOLN
OROMUCOSAL | Status: AC
  Filled 2017-11-07: qty 15

## 2017-11-07 MED ORDER — LACTATED RINGERS IV SOLN: INTRAVENOUS | Status: DC

## 2017-11-07 MED ORDER — MINERAL OIL PO OIL
TOPICAL_OIL | ORAL | Status: AC
  Filled 2017-11-07: qty 30

## 2017-11-07 MED ORDER — SODIUM CHLORIDE 0.9 % IJ SOLN
INTRAMUSCULAR | Status: AC
  Filled 2017-11-07: qty 20

## 2017-11-07 MED ORDER — PROPOFOL 10 MG/ML IV BOLUS
INTRAVENOUS | Status: DC | PRN
  Administered 2017-11-07 (×2): 20 mg via INTRAVENOUS

## 2017-11-07 MED ORDER — LIDOCAINE VISCOUS HCL 2 % MT SOLN
OROMUCOSAL | Status: AC
  Filled 2017-11-07: qty 45

## 2017-11-07 MED ORDER — PROMETHAZINE HCL 25 MG/ML IJ SOLN: 6.2500 mg | INTRAMUSCULAR | Status: DC | PRN

## 2017-11-07 MED ORDER — EPHEDRINE SULFATE 50 MG/ML IJ SOLN
INTRAMUSCULAR | Status: AC
  Filled 2017-11-07: qty 2

## 2017-11-07 MED ORDER — PROPOFOL 10 MG/ML IV BOLUS
INTRAVENOUS | Status: AC
  Filled 2017-11-07: qty 40

## 2017-11-07 MED ORDER — LIDOCAINE VISCOUS HCL 2 % MT SOLN
15.0000 mL | Freq: Once | OROMUCOSAL | Status: AC
  Administered 2017-11-07: 15 mL via OROMUCOSAL

## 2017-11-07 MED ORDER — PROPOFOL 500 MG/50ML IV EMUL
INTRAVENOUS | Status: DC | PRN
  Administered 2017-11-07: 150 ug/kg/min via INTRAVENOUS

## 2017-11-07 NOTE — Transfer of Care (Signed)
Immediate Anesthesia Transfer of Care Note  Patient: Lori Lopez  Procedure(s) Performed: ESOPHAGOGASTRODUODENOSCOPY (EGD) WITH PROPOFOL (N/A ) SAVORY DILATION (N/A ) BIOPSY  Patient Location: PACU  Anesthesia Type:MAC  Level of Consciousness: awake and alert   Airway & Oxygen Therapy: Patient Spontanous Breathing  Post-op Assessment: Report given to RN  Post vital signs: Reviewed and stable  Last Vitals:  Vitals Value Taken Time  BP    Temp    Pulse 77 11/07/2017  1:21 PM  Resp 15 11/07/2017  1:21 PM  SpO2 97 % 11/07/2017  1:21 PM  Vitals shown include unvalidated device data.  Last Pain:  Vitals:   11/07/17 1148  TempSrc: Oral         Complications: No apparent anesthesia complications

## 2017-11-07 NOTE — Anesthesia Postprocedure Evaluation (Signed)
Anesthesia Post Note  Patient: Lori Lopez  Procedure(s) Performed: ESOPHAGOGASTRODUODENOSCOPY (EGD) WITH PROPOFOL (N/A ) SAVORY DILATION (N/A ) BIOPSY  Patient location during evaluation: PACU Anesthesia Type: MAC Level of consciousness: awake and alert Pain management: pain level controlled Vital Signs Assessment: post-procedure vital signs reviewed and stable Respiratory status: spontaneous breathing Cardiovascular status: stable Postop Assessment: no apparent nausea or vomiting Anesthetic complications: no     Last Vitals:  Vitals:   11/07/17 1148 11/07/17 1156  BP: (!) 169/93   Pulse: 93   Resp: 18   Temp: 36.8 C   SpO2: 93% (!) 89%    Last Pain:  Vitals:   11/07/17 1148  TempSrc: Oral                 Cleveland Yarbro

## 2017-11-07 NOTE — Discharge Instructions (Signed)
I dilated your esophagus. I DID NOT SEE A DEFINITE NARROWING IN YOUR esophagus. You have MILD gastritis. I biopsied your ESOPHAGUS AND stomach.   DRINK WATER TO KEEP YOUR URINE LIGHT YELLOW.  Keep your weight 162 lbs or less.  AVOID REFLUX TRIGGERS. SEE INFO BELOW.  DO NOT EAT FAST FOOD.  Eliminate the caffeine in your diet.  Cut down on your smloking.  FOLLOW A LOW FAT DIET. MEATS SHOULD BE BAKED, BROILED, OR BOILED. Avoid fried foods. SEE INFO BELOW.    CONTINUE PROTONIX. TAKE 30 MINUTES PRIOR TO MEALS TWICE DAILY.  YOUR BIOPSY RESULTS WILL BE AVAILABLE IN 7 DAYS WITH YOUR RESULTS.    FOLLOW UP IN 4 MOS E30 DYSPHAGIA/GERD WITH DR. Izzy Courville.  UPPER ENDOSCOPY AFTER CARE Read the instructions outlined below and refer to this sheet in the next week. These discharge instructions provide you with general information on caring for yourself after you leave the hospital. While your treatment has been planned according to the most current medical practices available, unavoidable complications occasionally occur. If you have any problems or questions after discharge, call DR. Mallary Kreger, [phone].  ACTIVITY  You may resume your regular activity, but move at a slower pace for the next 24 hours.   Take frequent rest periods for the next 24 hours.   Walking will help get rid of the air and reduce the bloated feeling in your belly (abdomen).   No driving for 24 hours (because of the medicine (anesthesia) used during the test).   You may shower.   Do not sign any important legal documents or operate any machinery for 24 hours (because of the anesthesia used during the test).    NUTRITION  Drink plenty of fluids.   You may resume your normal diet as instructed by your doctor.   Begin with a light meal and progress to your normal diet. Heavy or fried foods are harder to digest and may make you feel sick to your stomach (nauseated).   Avoid alcoholic beverages for 24 hours or as  instructed.    MEDICATIONS  You may resume your normal medications.   WHAT YOU CAN EXPECT TODAY  Some feelings of bloating in the abdomen.   Passage of more gas than usual.    IF YOU HAD A BIOPSY TAKEN DURING THE UPPER ENDOSCOPY:  Eat a soft diet IF YOU HAVE NAUSEA, BLOATING, ABDOMINAL PAIN, OR VOMITING.    FINDING OUT THE RESULTS OF YOUR TEST Not all test results are available during your visit. DR. Oneida Alar WILL CALL YOU WITHIN 14 DAYS OF YOUR PROCEDUE WITH YOUR RESULTS. Do not assume everything is normal if you have not heard from DR. Rylinn Linzy, CALL HER OFFICE AT [phone].  SEEK IMMEDIATE MEDICAL ATTENTION AND CALL THE OFFICE: [phone] IF:  You have more than a spotting of blood in your stool.   Your belly is swollen (abdominal distention).   You are nauseated or vomiting.   You have a temperature over 101F.   You have abdominal pain or discomfort that is severe or gets worse throughout the day.  DYSPHAGIA DYSPHAGIA can be caused by stomach acid backing up into the tube that carries food from the mouth down to the stomach (lower esophagus).  TREATMENT There are a number of medicines used to treat DYSPHAGIA including: Antacids.  Proton-pump inhibitors: PROTONIX ZANTAC/PEPCID    Lifestyle and home remedies to control REFLUX and regurgitation  You may eliminate or reduce the frequency of heartburn by making the following  lifestyle changes:   Control your weight. Being overweight is a major risk factor for heartburn and GERD. Excess pounds put pressure on your abdomen, pushing up your stomach and causing acid to back up into your esophagus.    Eat smaller meals. 4 TO 6 MEALS A DAY. This reduces pressure on the lower esophageal sphincter, helping to prevent the valve from opening and acid from washing back into your esophagus.    Loosen your belt. Clothes that fit tightly around your waist put pressure on your abdomen and the lower esophageal sphincter.      Eliminate heartburn triggers. Everyone has specific triggers. Common triggers such as fatty or fried foods, spicy food, tomato sauce, carbonated beverages, alcohol, chocolate, mint, garlic, onion, caffeine and nicotine may make heartburn worse.    Avoid stooping or bending. Tying your shoes is OK. Bending over for longer periods to weed your garden isn't, especially soon after eating.    Don't lie down after a meal. Wait at least three to four hours after eating before going to bed, and don't lie down right after eating.     PUT THE HEAD OF YOUR BED ON 6 IN BLOCKS OR SLEEP ON A WEDGE.     Alternative medicine  Several home remedies exist for treating GERD, but they provide only temporary relief. They include drinking baking soda (sodium bicarbonate) added to water or drinking other fluids such as baking soda mixed with cream of tartar and water.  Although these liquids create temporary relief by neutralizing, washing away or buffering acids, eventually they aggravate the situation by adding gas and fluid to your stomach, increasing pressure and causing more acid reflux. Further, adding more sodium to your diet may increase your blood pressure and add stress to your heart, and excessive bicarbonate ingestion can alter the acid-base balance in your body.

## 2017-11-07 NOTE — Anesthesia Preprocedure Evaluation (Signed)
Anesthesia Evaluation  Patient identified by MRN, date of birth, ID band Patient awake    Reviewed: Allergy & Precautions, H&P , NPO status , Patient's Chart, lab work & pertinent test results, reviewed documented beta blocker date and time   Airway Mallampati: II  TM Distance: >3 FB Neck ROM: full    Dental no notable dental hx. (+) Teeth Intact, Dental Advidsory Given   Pulmonary neg pulmonary ROS, COPD, Current Smoker,    Pulmonary exam normal breath sounds clear to auscultation       Cardiovascular Exercise Tolerance: Good negative cardio ROS   Rhythm:regular Rate:Normal     Neuro/Psych PSYCHIATRIC DISORDERS Anxiety negative neurological ROS  negative psych ROS   GI/Hepatic negative GI ROS, Neg liver ROS, GERD  ,  Endo/Other  negative endocrine ROSHypothyroidism   Renal/GU negative Renal ROS  negative genitourinary   Musculoskeletal   Abdominal   Peds  Hematology negative hematology ROS (+)   Anesthesia Other Findings Previously on but stopped inhalers "didn't tolerate well" Previously on but stopped thyroid meds same reason  Reproductive/Obstetrics negative OB ROS                             Anesthesia Physical Anesthesia Plan  ASA: III  Anesthesia Plan: MAC   Post-op Pain Management:    Induction:   PONV Risk Score and Plan:   Airway Management Planned:   Additional Equipment:   Intra-op Plan:   Post-operative Plan:   Informed Consent: I have reviewed the patients History and Physical, chart, labs and discussed the procedure including the risks, benefits and alternatives for the proposed anesthesia with the patient or authorized representative who has indicated his/her understanding and acceptance.   Dental Advisory Given  Plan Discussed with: CRNA and Anesthesiologist  Anesthesia Plan Comments:         Anesthesia Quick Evaluation

## 2017-11-07 NOTE — Op Note (Signed)
[facility] Patient Name: Lori Lopez Procedure Date: 11/07/2017 12:33 PM MRN: [id number] Date of Birth: [birth year] Attending MD: Barney Drain MD, MD CSN: [id number] Age: 67 Admit Type: Outpatient Procedure:                Upper GI endoscopy WITH COLD FORCEPS                            BIOPSY/ESOPHAGEAL DILATION Indications:              Dyspepsia, Dysphagia Providers:                Barney Drain MD, MD, Janeece Riggers, RN, Randa Spike, Technician Referring MD:             Edwinna Areola. Hall MD Medicines:                Propofol per Anesthesia Complications:            No immediate complications. Estimated Blood Loss:     Estimated blood loss was minimal. Procedure:                Pre-Anesthesia Assessment:                           - Prior to the procedure, a History and Physical                            was performed, and patient medications and                            allergies were reviewed. The patient's tolerance of                            previous anesthesia was also reviewed. The risks                            and benefits of the procedure and the sedation                            options and risks were discussed with the patient.                            All questions were answered, and informed consent                            was obtained. Prior Anticoagulants: The patient has                            taken aspirin, last dose was 1 day prior to                            procedure. ASA Grade Assessment: II - A patient  with mild systemic disease. After reviewing the                            risks and benefits, the patient was deemed in                            satisfactory condition to undergo the procedure.                            After obtaining informed consent, the endoscope was                            passed under direct vision. Throughout the                            procedure, the  patient's blood pressure, pulse, and                            oxygen saturations were monitored continuously. The                            [id number] ([id number]) scope was introduced through the                            mouth, and advanced to the second part of duodenum.                            The upper GI endoscopy was accomplished without                            difficulty. The patient tolerated the procedure                            well. Scope In: 1:04:09 PM Scope Out: 1:12:30 PM Total Procedure Duration: 0 hours 8 minutes 21 seconds  Findings:      No endoscopic abnormality was evident in the esophagus to explain the       patient's complaint of dysphagia. It was decided, however, to proceed       with dilation of the entire esophagus DUE TO POSSIBLE PROXIMAL       ESOPHAGEAL WEB. A guidewire was placed and the scope was withdrawn.       Dilation was performed with a Savary dilator with mild resistance at 15       mm, 16 mm and 17 mm.      Localized mild inflammation characterized by congestion (edema) and       erythema was found in the gastric body and in the gastric antrum.       Biopsies were taken with a cold forceps for Helicobacter pylori testing.      The examined duodenum was normal. Impression:               - No endoscopic esophageal abnormality to explain                            patient's dysphagia. DYSPHAGIA/LARYNGEAL EDEMA  MOST                            LIKELY DUE TO UNCONTROLLED GERD IN SETTING OF PT                            NONADHERENCE                           - MILD Gastritis. Biopsied. Moderate Sedation:      Per Anesthesia Care Recommendation:           - Await pathology results.                           - Low fat diet. AVOID REFLUX TRIGGERS. DISCUSSED                            WITH PT PRIOR TO EGD. PT VOICED HER UNDERSTANDING.                           - Continue present medications.                           - Return to my office in 4  months.                           - Patient has a contact number available for                            emergencies. The signs and symptoms of potential                            delayed complications were discussed with the                            patient. Return to normal activities tomorrow.                            Written discharge instructions were provided to the                            patient. Procedure Code(s):        --- Professional ---                           [phone], Esophagogastroduodenoscopy, flexible,                            transoral; with insertion of guide wire followed by                            passage of dilator(s) through esophagus over guide                            wire  09628, Esophagogastroduodenoscopy, flexible,                            transoral; with biopsy, single or multiple Diagnosis Code(s):        --- Professional ---                           R13.10, Dysphagia, unspecified                           K29.70, Gastritis, unspecified, without bleeding                           R10.13, Epigastric pain CPT copyright 2017 American Medical Association. All rights reserved. The codes documented in this report are preliminary and upon coder review may  be revised to meet current compliance requirements. Barney Drain, MD Barney Drain MD, MD 11/07/2017 1:20:14 PM This report has been signed electronically. Number of Addenda: 0

## 2017-11-07 NOTE — H&P (Signed)
Primary Care Physician:  Celene Squibb, MD Primary Gastroenterologist:  Dr. Oneida Alar  Pre-Procedure History & Physical: HPI:  Lori Lopez is a 67 y.o. female here for [facility].  Past Medical History:  Diagnosis Date  . Adjustment disorder with depressed mood 09/08/2009  . Allergic rhinitis due to pollen 09/08/2009  . Anxiety   . Cigarette nicotine dependence, uncomplicated 11/03/3149  . COPD (chronic obstructive pulmonary disease) ([place]) 11/05/2010  . Degeneration of thoracolumbar intervertebral disc 06/13/2013  . 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 Hypothyroidism   . Mixed hyperlipidemia 05/15/2013  . Obesity due to excess calories 05/15/2013  . Tinea unguium 05/15/2013  . Vitamin D deficiency 12/28/2009    Past Surgical History:  Procedure Laterality Date  . BACK SURGERY    . CERVICAL DISC SURGERY    . COLONOSCOPY  2008   Dr. Oneida Alar: normal colon, small internal hemorrhoids.   . COLONOSCOPY N/A 04/08/2016   Dr. Oneida Alar: examined portion of ileum normal. two 5-6 mm polyps in descending colon and transverse colon, three 2-4 mm polyps in rectum, (all hyperplastic), internal and external hemorrhoids. Repeat in 10 years    Prior to Admission medications   Medication Sig Start Date End Date Taking? Authorizing Provider  ALPRAZolam Duanne Moron) 1 MG tablet Take 0.25-1 mg by mouth daily as needed for anxiety.  02/10/16  Yes [provider]  aspirin 81 MG tablet Take 81 mg by mouth daily.   Yes [provider]  cetirizine (ZYRTEC) 10 MG tablet Take 10 mg by mouth daily.   Yes [provider]  Cholecalciferol (VITAMIN D3) 1000 units CAPS Take 1 capsule by mouth daily.    Yes [provider]  diphenhydrAMINE (BENADRYL) 25 mg capsule Take 25 mg by mouth daily as needed for itching.   Yes [provider]  Multiple Vitamin (MULTIVITAMIN WITH MINERALS) TABS tablet Take 1 tablet by mouth daily.   Yes [provider]   pantoprazole (PROTONIX) 40 MG tablet Take 1 tablet (40 mg total) by mouth 2 (two) times daily before a meal. Take 30 minutes before breakfast 10/19/17 10/19/18 Yes Annitta Needs, NP  gabapentin (NEURONTIN) 300 MG capsule Take 1 capsule (300 mg total) by mouth 3 (three) times daily. 1 pill first day. 1 pill twice a day on the second day, then tid Patient taking differently: Take 300 mg by mouth 2 (two) times daily as needed. 1 pill first day. 1 pill twice a day on the second day, then ti 02/23/16   Davonna Belling, MD  nitroGLYCERIN (NITROSTAT) 0.4 MG SL tablet Place 1 tablet (0.4 mg total) under the tongue every 5 (five) minutes as needed for chest pain. 11/04/14   Herminio Commons, MD    Allergies as of 10/19/2017 - Review Complete 10/19/2017  Allergen Reaction Noted  . Shrimp [shellfish allergy] Anaphylaxis 10/10/2014  . Hydrocodone Hives and Itching 10/10/2014  . Lyrica [pregabalin] Other (See Comments) 10/10/2014    Family History  Problem Relation Age of Onset  . Diabetes Mother   . CAD Mother   . Heart disease Mother   . CAD Father   . Colon cancer Neg Hx   . Colon polyps Neg Hx     Social History   Socioeconomic History  . Marital status: Married    Spouse name: Not on file  . Number of children: Not on file  . Years of education: Not on file  . Highest education level: Not on file  Occupational History  .  Not on file  Social Needs  . Financial resource strain: Not on file  . Food insecurity:    Worry: Not on file    Inability: Not on file  . Transportation needs:    Medical: Not on file    Non-medical: Not on file  Tobacco Use  . Smoking status: Current Every Day Smoker    Years: 45.00    Types: Cigarettes  . Smokeless tobacco: Never Used  . Tobacco comment: 1 pack per week  Substance and Sexual Activity  . Alcohol use: No    Alcohol/week: 0.0 oz  . Drug use: No  . Sexual activity: Yes    Birth control/protection: Surgical  Lifestyle  . Physical  activity:    Days per week: Not on file    Minutes per session: Not on file  . Stress: Not on file  Relationships  . Social connections:    Talks on phone: Not on file    Gets together: Not on file    Attends religious service: Not on file    Active member of club or organization: Not on file    Attends meetings of clubs or organizations: Not on file    Relationship status: Not on file  . Intimate partner violence:    Fear of current or ex partner: Not on file    Emotionally abused: Not on file    Physically abused: Not on file    Forced sexual activity: Not on file  Other Topics Concern  . Not on file  Social History Narrative  . Not on file    Review of Systems: See HPI, otherwise negative ROS   Physical Exam: BP (!) 169/93   Pulse 93   Temp 98.3 F (36.8 C) (Oral)   Resp 18   SpO2 (!) 89%  General:   Alert,  pleasant and cooperative in NAD Head:  Normocephalic and atraumatic. Neck:  Supple; Lungs:  Clear throughout to auscultation.    Heart:  Regular rate and rhythm. Abdomen:  Soft, nontender and nondistended. Normal bowel sounds, without guarding, and without rebound.   Neurologic:  Alert and  oriented x4;  grossly normal neurologically.  Impression/Plan:    DYSPHAGIA/dyspepsia  PLAN:  EGD/DIL TODAY. DISCUSSED PROCEDURE, BENEFITS, & RISKS: < 1% chance of medication reaction, bleeding, OR perforation.

## 2017-11-07 NOTE — Anesthesia Postprocedure Evaluation (Signed)
Anesthesia Post Note  Patient: Lori Lopez  Procedure(s) Performed: ESOPHAGOGASTRODUODENOSCOPY (EGD) WITH PROPOFOL (N/A ) SAVORY DILATION (N/A ) BIOPSY  Patient location during evaluation: PACU Anesthesia Type: MAC Level of consciousness: awake and alert and oriented Pain management: satisfactory to patient Vital Signs Assessment: post-procedure vital signs reviewed and stable Cardiovascular status: blood pressure returned to baseline and stable Postop Assessment: no apparent nausea or vomiting Anesthetic complications: no     Last Vitals:  Vitals:   11/07/17 1148 11/07/17 1156  BP: (!) 169/93   Pulse: 93   Resp: 18   Temp: 36.8 C   SpO2: 93% (!) 89%    Last Pain:  Vitals:   11/07/17 1148  TempSrc: Oral                 [name]

## 2017-11-08 NOTE — Addendum Note (Signed)
Addendum  created 11/08/17 0843 by Ollen Bowl, CRNA   Charge Capture section accepted

## 2017-11-13 ENCOUNTER — Encounter (HOSPITAL_COMMUNITY): Payer: Self-pay | Admitting: Gastroenterology

## 2017-11-23 NOTE — Progress Notes (Signed)
LMOM to call.

## 2017-11-24 NOTE — Progress Notes (Signed)
Patient made aware of results and recommendations 

## 2017-11-24 NOTE — Progress Notes (Signed)
PT is aware.

## 2017-11-27 NOTE — Progress Notes (Signed)
PATIENT SCHEDULED  °

## 2018-01-19 DIAGNOSIS — E875 Hyperkalemia: Secondary | ICD-10-CM | POA: Diagnosis not present

## 2018-01-19 DIAGNOSIS — K219 Gastro-esophageal reflux disease without esophagitis: Secondary | ICD-10-CM | POA: Diagnosis not present

## 2018-01-19 DIAGNOSIS — E782 Mixed hyperlipidemia: Secondary | ICD-10-CM | POA: Diagnosis not present

## 2018-01-19 DIAGNOSIS — G629 Polyneuropathy, unspecified: Secondary | ICD-10-CM | POA: Diagnosis not present

## 2018-01-19 DIAGNOSIS — F411 Generalized anxiety disorder: Secondary | ICD-10-CM | POA: Diagnosis not present

## 2018-01-25 DIAGNOSIS — F411 Generalized anxiety disorder: Secondary | ICD-10-CM | POA: Diagnosis not present

## 2018-01-25 DIAGNOSIS — Z6829 Body mass index (BMI) 29.0-29.9, adult: Secondary | ICD-10-CM | POA: Diagnosis not present

## 2018-01-25 DIAGNOSIS — E782 Mixed hyperlipidemia: Secondary | ICD-10-CM | POA: Diagnosis not present

## 2018-01-25 DIAGNOSIS — K219 Gastro-esophageal reflux disease without esophagitis: Secondary | ICD-10-CM | POA: Diagnosis not present

## 2018-02-21 ENCOUNTER — Ambulatory Visit: Payer: PPO | Admitting: Gastroenterology

## 2018-04-17 ENCOUNTER — Other Ambulatory Visit: Payer: Self-pay | Admitting: Gastroenterology

## 2018-05-21 ENCOUNTER — Ambulatory Visit (INDEPENDENT_AMBULATORY_CARE_PROVIDER_SITE_OTHER): Payer: PPO | Admitting: Otolaryngology

## 2018-05-21 DIAGNOSIS — F17210 Nicotine dependence, cigarettes, uncomplicated: Secondary | ICD-10-CM

## 2018-05-21 DIAGNOSIS — J382 Nodules of vocal cords: Secondary | ICD-10-CM | POA: Diagnosis not present

## 2018-05-21 DIAGNOSIS — R490 Dysphonia: Secondary | ICD-10-CM | POA: Diagnosis not present

## 2018-05-23 ENCOUNTER — Encounter: Payer: Self-pay | Admitting: Gastroenterology

## 2018-05-23 ENCOUNTER — Ambulatory Visit (INDEPENDENT_AMBULATORY_CARE_PROVIDER_SITE_OTHER): Payer: PPO | Admitting: Gastroenterology

## 2018-05-23 DIAGNOSIS — R1319 Other dysphagia: Secondary | ICD-10-CM

## 2018-05-23 DIAGNOSIS — K219 Gastro-esophageal reflux disease without esophagitis: Secondary | ICD-10-CM | POA: Diagnosis not present

## 2018-05-23 DIAGNOSIS — R1310 Dysphagia, unspecified: Secondary | ICD-10-CM

## 2018-05-23 DIAGNOSIS — K625 Hemorrhage of anus and rectum: Secondary | ICD-10-CM

## 2018-05-23 NOTE — Patient Instructions (Addendum)
DRINK WATER TO KEEP YOUR URINE LIGHT YELLOW.  FOLLOW A HIGH FIBER DIET. AVOID ITEMS THAT CAUSE BLOATING & GAS.  AVOID REFLUX TRIGGERS. SEE INFO BELOW..  CONTINUE PROTONIX. TAKE 30 MINUTES PRIOR TO MEALS TWICE DAILY.    Add Famotidine 20 mg at bedtime MON THRU FRI.  STOP ASPIRIN.  USE PREPARATION H FOUR TIMES  A DAY IF NEEDED TO RELIEVE RECTAL PAIN/PRESSURE/BLEEDING.   ADD YOGA THREE DAYS A WEEK.  FOLLOW UP IN 6-12 MOS.   PLEASE CALL WITH QUESTIONS OR CONCERNS.     Lifestyle and home remedies TO MANAGE REFLUX/CHEST PAIN  You may eliminate or reduce the frequency of heartburn by making the following lifestyle changes:  . Control your weight. Being overweight is a major risk factor for heartburn and GERD. Excess pounds put pressure on your abdomen, pushing up your stomach and causing acid to back up into your esophagus.   . Eat smaller meals. 4 TO 6 MEALS A DAY. This reduces pressure on the lower esophageal sphincter, helping to prevent the valve from opening and acid from washing back into your esophagus.   Dolphus Jenny your belt. Clothes that fit tightly around your waist put pressure on your abdomen and the lower esophageal sphincter.   . Eliminate heartburn triggers. Everyone has specific triggers. Common triggers such as fatty or fried foods, spicy food, tomato sauce, carbonated beverages, alcohol, chocolate, mint, garlic, onion, caffeine and nicotine may make heartburn worse.   Marland Kitchen Avoid stooping or bending. Tying your shoes is OK. Bending over for longer periods to weed your garden isn't, especially soon after eating.   . Don't lie down after a meal. Wait at least three to four hours after eating before going to bed, and don't lie down right after eating.   Marland Kitchen PUT THE HEAD OF YOUR BED ON 6 INCH BLOCKS.   Alternative medicine . Several home remedies exist for treating GERD, but they provide only temporary relief. They include drinking baking soda (sodium bicarbonate) added  to water or drinking other fluids such as baking soda mixed with cream of tartar and water.  . Although these liquids create temporary relief by neutralizing, washing away or buffering acids, eventually they aggravate the situation by adding gas and fluid to your stomach, increasing pressure and causing more acid reflux. Further, adding more sodium to your diet may increase your blood pressure and add stress to your heart, and excessive bicarbonate ingestion can alter the acid-base balance in your body.

## 2018-05-23 NOTE — Progress Notes (Signed)
Subjective:    Patient ID: Lori Lopez, female    DOB: Sep 26, 1950, 68 y.o.   MRN: [id number]  Celene Squibb, MD  HPI May see some blood when you wipe. SWALLOWING BETTER. HEARTBURN NOT IDEALLY CONTROLLED. NOW SMOKING: 1 PK EVERY WEEK. NO CHOCOLATE ICE CREAM, AT BEDTIME. TROUBLE WITH RUNNY NOSE. NO WOOD STOVE. LEAVES IN OLD HOUSE AND REMODELED WITH SHEET ROCK AND ONE ROOM BESIDE HER BEDROOM. SAW DR. Benjamine Mola AND TOLD HIM ABOUT RUNNY NOSE AND TOLD TO LAVAGE NOSE EVERY DAY. WANTS TO KNOW FAMOTIDINE QHS IS SAFE TO TAKE WITH PROTONIX. BMs: 3X.DAY: 4-6. HEARTBURN: AFTER FOODS (BRUNSWICK STEW, TOMATO KETCHUP, TOMATOES, PINTO BEANS). TRYING TO STAY AWAY FROM ONIONS.   PT DENIES FEVER, CHILLS, HEMATEMESIS, nausea, vomiting, melena, diarrhea, CHEST PAIN, SHORTNESS OF BREATH, CHANGE IN BOWEL IN HABITS, constipation, abdominal pain, problems swallowing, OR problems with sedation.  Past Medical History:  Diagnosis Date  . Adjustment disorder with depressed mood 09/08/2009  . Allergic rhinitis due to pollen 09/08/2009  . Anxiety   . Cigarette nicotine dependence, uncomplicated 8/92/1194  . COPD (chronic obstructive pulmonary disease) ([place]) 11/05/2010  . Degeneration of thoracolumbar intervertebral disc 06/13/2013  . 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 Hypothyroidism   . Mixed hyperlipidemia 05/15/2013  . Obesity due to excess calories 05/15/2013  . Tinea unguium 05/15/2013  . Vitamin D deficiency 12/28/2009   Past Surgical History:  Procedure Laterality Date  . BACK SURGERY    . BIOPSY  11/07/2017   Procedure: BIOPSY;  Surgeon: Danie Binder, MD;  [facility];  Service: Endoscopy;;  gastric biopsy   . CERVICAL DISC SURGERY    . COLONOSCOPY  2008   Dr. Oneida Alar: normal colon, small internal hemorrhoids.   . COLONOSCOPY N/A 04/08/2016   Dr. Oneida Alar: examined portion of ileum normal. two 5-6 mm polyps in descending colon and transverse colon, three 2-4 mm polyps in rectum,  (all hyperplastic), internal and external hemorrhoids. Repeat in 10 years  . ESOPHAGOGASTRODUODENOSCOPY (EGD) WITH PROPOFOL N/A 11/07/2017   Procedure: ESOPHAGOGASTRODUODENOSCOPY (EGD) WITH PROPOFOL;  Surgeon: Danie Binder, MD;  [facility];  Service: Endoscopy;  Laterality: N/A;  1:30pm  . SAVORY DILATION N/A 11/07/2017   Procedure: SAVORY DILATION;  Surgeon: Danie Binder, MD;  [facility];  Service: Endoscopy;  Laterality: N/A;   Allergies  Allergen Reactions  . Shrimp [Shellfish Allergy] Anaphylaxis  . Hydrocodone Hives and Itching  . Lyrica [Pregabalin] Other (See Comments)    unknown    Current Outpatient Medications  Medication Sig Dispense Refill  . ALPRAZolam (XANAX) 1 MG tablet Take 0.25-1 mg by mouth daily as needed for anxiety.     Marland Kitchen aspirin 81 MG tablet Take 81 mg by mouth daily.    .      . Cholecalciferol (VITAMIN D3) 1000 units CAPS Take 1 capsule by mouth daily.     . diphenhydrAMINE (BENADRYL) 25 mg capsule Take 25 mg by mouth daily as needed for itching.    . gabapentin (NEURONTIN) 300 MG capsule Take 300 mg by mouth 2 (two) times daily as needed. 1 pill first day. 1 pill twice a day on the second day, then ti)    . Multiple Vitamin (MULTIVITAMIN WITH MINERALS) TABS tablet Take 1 tablet by mouth daily.    . nitroGLYCERIN (NITROSTAT) 0.4 MG SL tablet Place 1 tablet (0.4 mg total) under the tongue every 5 (five) minutes as needed for chest pain.    Marland Kitchen  pantoprazole (PROTONIX) 40 MG tablet TAKE 1 TABLET BY MOUTH TWICE DAILY 30  MINUTES  BEFORE  A  MEAL     Review of Systems PER HPI OTHERWISE ALL SYSTEMS ARE NEGATIVE.    Objective:   Physical Exam Vitals signs reviewed.  Constitutional:      General: She is not in acute distress.    Appearance: She is well-developed.  HENT:     Head: Normocephalic and atraumatic.     Mouth/Throat:     Pharynx: No oropharyngeal exudate.  Eyes:     General: No scleral icterus.    Pupils: Pupils are equal,  round, and reactive to light.  Neck:     Musculoskeletal: Normal range of motion and neck supple.  Cardiovascular:     Rate and Rhythm: Normal rate and regular rhythm.     Heart sounds: Normal heart sounds.  Pulmonary:     Effort: Pulmonary effort is normal. No respiratory distress.     Breath sounds: Normal breath sounds.  Abdominal:     General: Bowel sounds are normal. There is no distension.     Palpations: Abdomen is soft.     Tenderness: There is no abdominal tenderness.  Lymphadenopathy:     Cervical: No cervical adenopathy.  Neurological:     Mental Status: She is alert and oriented to person, place, and time. Mental status is at baseline.     Comments: NO  NEW FOCAL DEFICITS        Assessment & Plan:

## 2018-05-23 NOTE — Assessment & Plan Note (Signed)
SMALL VOLUME AND LIKELY DUE TO HEMORRHOIDS. TCS UP TO DATE.  STOP ASA UNLESS MEDICALLY NECESSARY. USE PREPARATION H FOUR TIMES  A DAY IF NEEDED TO RELIEVE RECTAL PAIN/PRESSURE/BLEEDING. FOLLOW UP IN 6 MOS.  CALL WITH QUESTIONS OR CONCERNS.

## 2018-05-23 NOTE — Assessment & Plan Note (Signed)
SYMPTOMS NOT IDEALLY CONTROLLED AND DUE TO DIETARY NON-ADHERENCE.  DRINK WATER TO KEEP YOUR URINE LIGHT YELLOW. FOLLOW A HIGH FIBER DIET. AVOID ITEMS THAT CAUSE BLOATING & GAS. AVOID REFLUX TRIGGERS.  HANDOUT GIVEN. CONTINUE PROTONIX. TAKE 30 MINUTES PRIOR TO MEALS TWICE DAILY. Add Famotidine 20 mg at bedtime MON THRU FRI. ADD YOGA THREE DAYS A WEEK. FOLLOW UP IN 6-12 MOS.   PLEASE CALL WITH QUESTIONS OR CONCERNS.

## 2018-05-23 NOTE — Assessment & Plan Note (Signed)
SYMPTOMS CONTROLLED/RESOLVED.  CONTINUE TO MONITOR SYMPTOMS. FOLLOW UP IN 6-12 MOS.

## 2018-05-23 NOTE — Progress Notes (Signed)
ON RECALL  °

## 2018-05-24 NOTE — Progress Notes (Signed)
CC'D TO PCP °

## 2018-06-02 ENCOUNTER — Encounter (HOSPITAL_COMMUNITY): Payer: Self-pay

## 2018-06-02 ENCOUNTER — Inpatient Hospital Stay (HOSPITAL_COMMUNITY)
Admission: EM | Admit: 2018-06-02 | Discharge: 2018-06-03 | DRG: 193 | Disposition: A | Discharge status: Home / Self Care | Payer: PPO | Attending: Internal Medicine | Admitting: Internal Medicine

## 2018-06-02 ENCOUNTER — Other Ambulatory Visit: Payer: Self-pay

## 2018-06-02 ENCOUNTER — Emergency Department (HOSPITAL_COMMUNITY): Payer: PPO

## 2018-06-02 DIAGNOSIS — E860 Dehydration: Secondary | ICD-10-CM | POA: Diagnosis present

## 2018-06-02 DIAGNOSIS — J9601 Acute respiratory failure with hypoxia: Secondary | ICD-10-CM | POA: Diagnosis not present

## 2018-06-02 DIAGNOSIS — Z79899 Other long term (current) drug therapy: Secondary | ICD-10-CM

## 2018-06-02 DIAGNOSIS — J101 Influenza due to other identified influenza virus with other respiratory manifestations: Secondary | ICD-10-CM | POA: Diagnosis not present

## 2018-06-02 DIAGNOSIS — R1310 Dysphagia, unspecified: Secondary | ICD-10-CM

## 2018-06-02 DIAGNOSIS — R030 Elevated blood-pressure reading, without diagnosis of hypertension: Secondary | ICD-10-CM | POA: Diagnosis present

## 2018-06-02 DIAGNOSIS — E039 Hypothyroidism, unspecified: Secondary | ICD-10-CM | POA: Diagnosis not present

## 2018-06-02 DIAGNOSIS — E782 Mixed hyperlipidemia: Secondary | ICD-10-CM | POA: Diagnosis not present

## 2018-06-02 DIAGNOSIS — K5900 Constipation, unspecified: Secondary | ICD-10-CM | POA: Diagnosis present

## 2018-06-02 DIAGNOSIS — R05 Cough: Secondary | ICD-10-CM | POA: Diagnosis not present

## 2018-06-02 DIAGNOSIS — Z91013 Allergy to seafood: Secondary | ICD-10-CM

## 2018-06-02 DIAGNOSIS — J449 Chronic obstructive pulmonary disease, unspecified: Secondary | ICD-10-CM | POA: Diagnosis present

## 2018-06-02 DIAGNOSIS — R0602 Shortness of breath: Secondary | ICD-10-CM

## 2018-06-02 DIAGNOSIS — R000 Tachycardia, unspecified: Secondary | ICD-10-CM | POA: Diagnosis present

## 2018-06-02 DIAGNOSIS — J441 Chronic obstructive pulmonary disease with (acute) exacerbation: Secondary | ICD-10-CM | POA: Diagnosis not present

## 2018-06-02 DIAGNOSIS — F17210 Nicotine dependence, cigarettes, uncomplicated: Secondary | ICD-10-CM | POA: Diagnosis not present

## 2018-06-02 DIAGNOSIS — F411 Generalized anxiety disorder: Secondary | ICD-10-CM | POA: Diagnosis present

## 2018-06-02 DIAGNOSIS — K219 Gastro-esophageal reflux disease without esophagitis: Secondary | ICD-10-CM | POA: Diagnosis present

## 2018-06-02 LAB — CBC WITH DIFFERENTIAL/PLATELET
ABS IMMATURE GRANULOCYTES: 0.02 10*3/uL (ref 0.00–0.07)
BASOS ABS: 0 10*3/uL (ref 0.0–0.1)
Basophils Relative: 0 %
Eosinophils Absolute: 0.1 10*3/uL (ref 0.0–0.5)
Eosinophils Relative: 1 %
HCT: 45.5 % (ref 36.0–46.0)
HEMOGLOBIN: 14.9 g/dL (ref 12.0–15.0)
Immature Granulocytes: 0 %
LYMPHS PCT: 15 %
Lymphs Abs: 1.5 10*3/uL (ref 0.7–4.0)
MCH: 29.7 pg (ref 26.0–34.0)
MCHC: 32.7 g/dL (ref 30.0–36.0)
MCV: 90.6 fL (ref 80.0–100.0)
Monocytes Absolute: 0.8 10*3/uL (ref 0.1–1.0)
Monocytes Relative: 8 %
NEUTROS ABS: 7.4 10*3/uL (ref 1.7–7.7)
NRBC: 0 % (ref 0.0–0.2)
Neutrophils Relative %: 76 %
Platelets: 293 10*3/uL (ref 150–400)
RBC: 5.02 MIL/uL (ref 3.87–5.11)
RDW: 13.1 % (ref 11.5–15.5)
WBC: 9.8 10*3/uL (ref 4.0–10.5)

## 2018-06-02 LAB — BASIC METABOLIC PANEL
Anion gap: 8 (ref 5–15)
BUN: 15 mg/dL (ref 8–23)
CHLORIDE: 103 mmol/L (ref 98–111)
CO2: 26 mmol/L (ref 22–32)
CREATININE: 1.02 mg/dL — AB (ref 0.44–1.00)
Calcium: 8.7 mg/dL — ABNORMAL LOW (ref 8.9–10.3)
GFR calc non Af Amer: 57 mL/min — ABNORMAL LOW (ref >60)
Glucose, Bld: 107 mg/dL — ABNORMAL HIGH (ref 70–99)
POTASSIUM: 4.4 mmol/L (ref 3.5–5.1)
SODIUM: 137 mmol/L (ref 135–145)

## 2018-06-02 LAB — BRAIN NATRIURETIC PEPTIDE: B NATRIURETIC PEPTIDE 5: 40 pg/mL (ref 0.0–100.0)

## 2018-06-02 LAB — TROPONIN I: Troponin I: <0.03 ng/mL (ref <0.03)

## 2018-06-02 LAB — INFLUENZA PANEL BY PCR (TYPE A & B)
INFLAPCR: POSITIVE — AB
INFLBPCR: NEGATIVE

## 2018-06-02 LAB — LACTIC ACID, PLASMA: LACTIC ACID, VENOUS: 0.6 mmol/L (ref 0.5–1.9)

## 2018-06-02 LAB — TSH: TSH: 3.088 u[IU]/mL (ref 0.350–4.500)

## 2018-06-02 MED ORDER — ACETAMINOPHEN 650 MG RE SUPP: 650.0000 mg | Freq: Four times a day (QID) | RECTAL | Status: DC | PRN

## 2018-06-02 MED ORDER — GUAIFENESIN ER 600 MG PO TB12
1200.0000 mg | ORAL_TABLET | Freq: Two times a day (BID) | ORAL | Status: DC
  Administered 2018-06-02 – 2018-06-03 (×3): 1200 mg via ORAL
  Filled 2018-06-02 (×7): qty 2

## 2018-06-02 MED ORDER — ADULT MULTIVITAMIN W/MINERALS CH
1.0000 | ORAL_TABLET | Freq: Every day | ORAL | Status: DC
  Administered 2018-06-02 – 2018-06-03 (×2): 1 via ORAL
  Filled 2018-06-02 (×2): qty 1

## 2018-06-02 MED ORDER — PANTOPRAZOLE SODIUM 40 MG PO TBEC
40.0000 mg | DELAYED_RELEASE_TABLET | Freq: Two times a day (BID) | ORAL | Status: DC
  Administered 2018-06-02 – 2018-06-03 (×3): 40 mg via ORAL
  Filled 2018-06-02 (×3): qty 1

## 2018-06-02 MED ORDER — ACETAMINOPHEN 325 MG PO TABS: 650.0000 mg | ORAL_TABLET | Freq: Four times a day (QID) | ORAL | Status: DC | PRN

## 2018-06-02 MED ORDER — METHYLPREDNISOLONE SODIUM SUCC 125 MG IJ SOLR
60.0000 mg | Freq: Four times a day (QID) | INTRAMUSCULAR | Status: DC
  Administered 2018-06-02 – 2018-06-03 (×4): 60 mg via INTRAVENOUS
  Filled 2018-06-02 (×4): qty 2

## 2018-06-02 MED ORDER — IPRATROPIUM-ALBUTEROL 0.5-2.5 (3) MG/3ML IN SOLN
3.0000 mL | Freq: Once | RESPIRATORY_TRACT | Status: AC
  Administered 2018-06-02: 3 mL via RESPIRATORY_TRACT
  Filled 2018-06-02: qty 3

## 2018-06-02 MED ORDER — ONDANSETRON HCL 4 MG/2ML IJ SOLN
4.0000 mg | Freq: Four times a day (QID) | INTRAMUSCULAR | Status: DC | PRN
  Administered 2018-06-02: 4 mg via INTRAVENOUS
  Filled 2018-06-02: qty 2

## 2018-06-02 MED ORDER — ALBUTEROL SULFATE (2.5 MG/3ML) 0.083% IN NEBU
2.5000 mg | INHALATION_SOLUTION | Freq: Once | RESPIRATORY_TRACT | Status: AC
Start: 2018-06-02 — End: 2018-06-02
  Administered 2018-06-02: 2.5 mg via RESPIRATORY_TRACT
  Filled 2018-06-02: qty 3

## 2018-06-02 MED ORDER — NICOTINE 21 MG/24HR TD PT24: 21.0000 mg | MEDICATED_PATCH | Freq: Every day | TRANSDERMAL | Status: DC | PRN

## 2018-06-02 MED ORDER — DEXTROMETHORPHAN POLISTIREX ER 30 MG/5ML PO SUER
60.0000 mg | Freq: Two times a day (BID) | ORAL | Status: AC
  Administered 2018-06-02 – 2018-06-03 (×3): 60 mg via ORAL
  Filled 2018-06-02 (×4): qty 10

## 2018-06-02 MED ORDER — VITAMIN D 25 MCG (1000 UNIT) PO TABS
1000.0000 [IU] | ORAL_TABLET | Freq: Every day | ORAL | Status: DC
  Administered 2018-06-02 – 2018-06-03 (×2): 1000 [IU] via ORAL
  Filled 2018-06-02 (×4): qty 1

## 2018-06-02 MED ORDER — OSELTAMIVIR PHOSPHATE 30 MG PO CAPS
30.0000 mg | ORAL_CAPSULE | Freq: Two times a day (BID) | ORAL | Status: DC
  Administered 2018-06-02 – 2018-06-03 (×2): 30 mg via ORAL
  Filled 2018-06-02 (×2): qty 1

## 2018-06-02 MED ORDER — METHYLPREDNISOLONE SODIUM SUCC 125 MG IJ SOLR
125.0000 mg | Freq: Once | INTRAMUSCULAR | Status: AC
  Administered 2018-06-02: 125 mg via INTRAVENOUS
  Filled 2018-06-02: qty 2

## 2018-06-02 MED ORDER — LORATADINE 10 MG PO TABS
10.0000 mg | ORAL_TABLET | Freq: Every day | ORAL | Status: DC
  Administered 2018-06-02 – 2018-06-03 (×2): 10 mg via ORAL
  Filled 2018-06-02 (×2): qty 1

## 2018-06-02 MED ORDER — TRAZODONE HCL 50 MG PO TABS: 50.0000 mg | ORAL_TABLET | Freq: Every evening | ORAL | Status: DC | PRN

## 2018-06-02 MED ORDER — ALPRAZOLAM 0.5 MG PO TABS: 0.5000 mg | ORAL_TABLET | Freq: Three times a day (TID) | ORAL | Status: DC | PRN

## 2018-06-02 MED ORDER — POLYETHYLENE GLYCOL 3350 17 G PO PACK: 17.0000 g | PACK | Freq: Every day | ORAL | Status: DC | PRN

## 2018-06-02 MED ORDER — OSELTAMIVIR PHOSPHATE 75 MG PO CAPS
75.0000 mg | ORAL_CAPSULE | Freq: Once | ORAL | Status: AC
  Administered 2018-06-02: 75 mg via ORAL
  Filled 2018-06-02: qty 1

## 2018-06-02 MED ORDER — IPRATROPIUM-ALBUTEROL 0.5-2.5 (3) MG/3ML IN SOLN
3.0000 mL | Freq: Four times a day (QID) | RESPIRATORY_TRACT | Status: DC
  Administered 2018-06-02 – 2018-06-03 (×5): 3 mL via RESPIRATORY_TRACT
  Filled 2018-06-02 (×5): qty 3

## 2018-06-02 MED ORDER — OSELTAMIVIR PHOSPHATE 75 MG PO CAPS: 75.0000 mg | ORAL_CAPSULE | Freq: Two times a day (BID) | ORAL | Status: DC

## 2018-06-02 MED ORDER — FAMOTIDINE 20 MG PO TABS
20.0000 mg | ORAL_TABLET | Freq: Every day | ORAL | Status: DC
  Administered 2018-06-02: 20 mg via ORAL
  Filled 2018-06-02: qty 1

## 2018-06-02 MED ORDER — ONDANSETRON HCL 4 MG PO TABS: 4.0000 mg | ORAL_TABLET | Freq: Four times a day (QID) | ORAL | Status: DC | PRN

## 2018-06-02 MED ORDER — ENOXAPARIN SODIUM 40 MG/0.4ML ~~LOC~~ SOLN
40.0000 mg | SUBCUTANEOUS | Status: DC
  Administered 2018-06-02: 40 mg via SUBCUTANEOUS
  Filled 2018-06-02 (×2): qty 0.4

## 2018-06-02 NOTE — ED Triage Notes (Signed)
Pt awoke this morning c/o Sob  and difficulty breathing. Pt has active,, productive cough. Pt describes chest and throat as if it is burning.

## 2018-06-02 NOTE — ED Notes (Signed)
Pt states better now, laying in bed.

## 2018-06-02 NOTE — H&P (Signed)
History and Physical  Lori Lopez VOJ:[id number] DOB: February 13, 1951 DOA: 06/02/2018  Referring physician: Milagros Evener MD  PCP: Celene Squibb, MD   Chief Complaint: SOB  HPI: Lori Lopez is a 68 y.o. female longtime smoker who reports she quit about 1 week ago presented to the emergency department because she woke up early this morning with shortness of breath and difficulty breathing.  She has had a productive cough with whitish thick sputum.  She has burning in her chest and throat.  She denies fever and chills.  She has been wheezing and coughing.  She says that she felt fine yesterday.  She denies having chest pain.  She denies swelling in her legs and feet.  She denies pain in the legs.  ED course: The patient has been monitored in the ED for several hours.  She was noted to have diffuse wheezing on arrival and she was hypoxic.  She was started on oxygen and given nebulizer treatments and IV Solu-Medrol.  She continued to desaturate with ambulation on room air.  She has some improvement but not back to her baseline.  Her influenza A test came back positive and she was started on Tamiflu.  She was ambulated on room air in ED and unfortunately she continued to desaturate and complain of shortness of breath.  She is being admitted for further management.    Review of Systems: All systems reviewed and apart from history of presenting illness, are negative.  Past Medical History:  Diagnosis Date  . Adjustment disorder with depressed mood 09/08/2009  . Allergic rhinitis due to pollen 09/08/2009  . Anxiety   . Cigarette nicotine dependence, uncomplicated 9/93/7169  . COPD (chronic obstructive pulmonary disease) ([place]) 11/05/2010  . Degeneration of thoracolumbar intervertebral disc 06/13/2013  . 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 Hypothyroidism   . Mixed hyperlipidemia 05/15/2013  . Obesity due to excess calories 05/15/2013  . Tinea unguium 05/15/2013  . Vitamin D  deficiency 12/28/2009   Past Surgical History:  Procedure Laterality Date  . BACK SURGERY    . BIOPSY  11/07/2017   Procedure: BIOPSY;  Surgeon: Danie Binder, MD;  [facility];  Service: Endoscopy;;  gastric biopsy   . CERVICAL DISC SURGERY    . COLONOSCOPY  2008   Dr. Oneida Alar: normal colon, small internal hemorrhoids.   . COLONOSCOPY N/A 04/08/2016   Dr. Oneida Alar: examined portion of ileum normal. two 5-6 mm polyps in descending colon and transverse colon, three 2-4 mm polyps in rectum, (all hyperplastic), internal and external hemorrhoids. Repeat in 10 years  . ESOPHAGOGASTRODUODENOSCOPY (EGD) WITH PROPOFOL N/A 11/07/2017   Procedure: ESOPHAGOGASTRODUODENOSCOPY (EGD) WITH PROPOFOL;  Surgeon: Danie Binder, MD;  [facility];  Service: Endoscopy;  Laterality: N/A;  1:30pm  . SAVORY DILATION N/A 11/07/2017   Procedure: SAVORY DILATION;  Surgeon: Danie Binder, MD;  [facility];  Service: Endoscopy;  Laterality: N/A;   Social History:  reports that she quit smoking 7 days ago. Her smoking use included cigarettes. She smoked 0.00 packs per day for 45.00 years. She has never used smokeless tobacco. She reports that she does not drink alcohol or use drugs.  Allergies  Allergen Reactions  . Shrimp [Shellfish Allergy] Anaphylaxis  . Hydrocodone Hives and Itching  . Lyrica [Pregabalin] Other (See Comments)    unknown    Family History  Problem Relation Age of Onset  . Diabetes Mother   . CAD Mother   .  Heart disease Mother   . CAD Father   . Colon cancer Neg Hx   . Colon polyps Neg Hx     Prior to Admission medications   Medication Sig Start Date End Date Taking? Authorizing Provider  ALPRAZolam Duanne Moron) 1 MG tablet Take 0.25-1 mg by mouth daily as needed for anxiety.  02/10/16  Yes [provider]  cetirizine (ZYRTEC) 10 MG tablet Take 10 mg by mouth daily.   Yes [provider]  Cholecalciferol (VITAMIN D3) 1000 units CAPS Take 1  capsule by mouth daily.    Yes [provider]  diphenhydrAMINE (BENADRYL) 25 mg capsule Take 25 mg by mouth daily as needed for itching.   Yes [provider]  Multiple Vitamin (MULTIVITAMIN WITH MINERALS) TABS tablet Take 1 tablet by mouth daily.   Yes [provider]  pantoprazole (PROTONIX) 40 MG tablet TAKE 1 TABLET BY MOUTH TWICE DAILY 30  MINUTES  BEFORE  A  MEAL 04/18/18  Yes Mahala Menghini, PA-C   Physical Exam: Vitals:   06/02/18 0644 06/02/18 0645  BP: (!) 142/68   Pulse: (!) 109   Resp: (!) 24   Temp: 98.2 F (36.8 C)   TempSrc: Oral   SpO2: 91%   Weight:  73.5 kg  Height:  5\' 2"  (1.575 m)     General exam: Moderately built and nourished patient, lying comfortably supine on the gurney in no obvious distress.  Head, eyes and ENT: Nontraumatic and normocephalic. Pupils equally reacting to light and accommodation. Oral mucosa moist.  Neck: Supple. No JVD, carotid bruit or thyromegaly.  Lymphatics: No lymphadenopathy.  Respiratory system: diffuse rales and wheezing bilateral. Moderate increased work of breathing.  Cardiovascular system: S1 and S2 heard, tachycardic. No JVD, murmurs, gallops, clicks or pedal edema.  Gastrointestinal system: Abdomen is nondistended, soft and nontender. Normal bowel sounds heard. No organomegaly or masses appreciated.  Central nervous system: Alert and oriented. No focal neurological deficits.  Extremities: Symmetric 5 x 5 power. Peripheral pulses symmetrically felt.   Skin: No rashes or acute findings.  Musculoskeletal system: Negative exam.  Psychiatry: Pleasant and cooperative.  Labs on Admission:  Basic Metabolic Panel: Recent Labs  Lab 06/02/18 0648  NA 137  K 4.4  CL 103  CO2 26  GLUCOSE 107*  BUN 15  CREATININE 1.02*  CALCIUM 8.7*   Liver Function Tests: No results for input(s): AST, ALT, ALKPHOS, BILITOT, PROT, ALBUMIN in the last 168 hours. No results for input(s): LIPASE, AMYLASE  in the last 168 hours. No results for input(s): AMMONIA in the last 168 hours. CBC: Recent Labs  Lab 06/02/18 0648  WBC 9.8  NEUTROABS 7.4  HGB 14.9  HCT 45.5  MCV 90.6  PLT 293   Cardiac Enzymes: Recent Labs  Lab 06/02/18 0648  TROPONINI <0.03    BNP (last 3 results) No results for input(s): PROBNP in the last 8760 hours. CBG: No results for input(s): GLUCAP in the last 168 hours.  Radiological Exams on Admission: Dg Chest 2 View  Result Date: 06/02/2018 CLINICAL DATA:  Shortness of breath and difficulty breathing, productive cough. EXAM: CHEST - 2 VIEW COMPARISON:  Chest x-ray dated 10/12/2015. FINDINGS: The heart size and mediastinal contours are within normal limits. Both lungs are clear. The visualized skeletal structures are unremarkable. IMPRESSION: No active cardiopulmonary disease. No evidence of pneumonia or pulmonary edema. Electronically Signed   By: Franki Cabot M.D.   On: 06/02/2018 08:40    EKG: Independently reviewed.  Assessment/Plan Principal Problem:   Acute respiratory failure with hypoxia (HCC) Active Problems:   Influenza A   Constipation   GERD (gastroesophageal reflux disease)   Dysphagia   COPD with acute exacerbation (HCC)   Cigarette nicotine dependence, uncomplicated   Hypothyroidism   COPD (chronic obstructive pulmonary disease) (HCC)   Sinus tachycardia   GAD (generalized anxiety disorder)   Elevated blood pressure reading   1. COPD with acute exacerbation - Admit for inpatient medical management with IV steroids, scheduled nebs, supplemental oxygen. This is from a viral influenza A infection and has been started on tamiflu.  2. Influenza A - Tamiflu 75 mg BID x 5 days and supportive care.  3. GERD - protonix 40 mg BID ordered.  4. Hypothyroidism - reported in history, likely subclinical as she is not on thyroid replacement therapy.  TSH pending.  5. Sinus tachycardia - secondary to dehydration and influenza infection. Treating  supportively.  6. GAD - resumed home alprazolam as needed.  7. Elevated blood pressure - no known history of HTN, monitor, treat as needed.  8. Longtime smoker - reportedly quit 1 week ago after 45 years of smoking, nicotine patch ordered as needed for cravings. 9. Constipation - laxatives ordered as needed.  10. Acute respiratory failure with hypoxia - continue supplemental oxygen as needed and plan to wean to room air as she recovers from acute illness.    DVT Prophylaxis: lovenox  Code Status: Full   Family Communication: patient   Disposition Plan: home when medically stabilized,   Time spent: 50 mins  Irwin Brakeman, MD [facility]

## 2018-06-02 NOTE — ED Notes (Signed)
ED TO INPATIENT HANDOFF REPORT  Name/Age/Gender Lori Lopez 68 y.o. female  Code Status    Code Status Orders  (From admission, onward)         Start     Ordered   06/02/18 1137  Full code  Continuous     06/02/18 1136        Code Status History    This patient has a current code status but no historical code status.      Home/SNF/Other Home  Chief Complaint sob  Level of Care/Admitting Diagnosis ED Disposition    ED Disposition Condition [facility] Area: [facility] [[id number]]  Level of Care: Med-Surg [16]  Diagnosis: Acute respiratory failure with hypoxia [facility]) [045409]  Admitting Physician: [place], [place]  Attending Physician: Murlean Iba [[id number]]  Estimated length of stay: past midnight tomorrow  Certification:: I certify this patient will need inpatient services for at least 2 midnights  PT Class (Do Not Modify): Inpatient [101]  PT Acc Code (Do Not Modify): Private [1]       Medical History Past Medical History:  Diagnosis Date  . Adjustment disorder with depressed mood 09/08/2009  . Allergic rhinitis due to pollen 09/08/2009  . Anxiety   . Cigarette nicotine dependence, uncomplicated 12/17/9145  . COPD (chronic obstructive pulmonary disease) ([place]) 11/05/2010  . Degeneration of thoracolumbar intervertebral disc 06/13/2013  . 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 Hypothyroidism   . Mixed hyperlipidemia 05/15/2013  . Obesity due to excess calories 05/15/2013  . Tinea unguium 05/15/2013  . Vitamin D deficiency 12/28/2009    Allergies Allergies  Allergen Reactions  . Shrimp [Shellfish Allergy] Anaphylaxis  . Hydrocodone Hives and Itching  . Lyrica [Pregabalin] Other (See Comments)    unknown    IV Location/Drains/Wounds Patient Lines/Drains/Airways Status   Active Line/Drains/Airways    Name:   Placement date:   Placement time:   Site:   Days:   Peripheral IV 06/02/18 Right Hand    06/02/18    0648    Hand   less than 1          Labs/Imaging Results for orders placed or performed during the hospital encounter of 06/02/18 (from the past 48 hour(s))  CBC with Differential/Platelet     Status: None   Collection Time: 06/02/18  6:48 AM  Result Value Ref Range   WBC 9.8 4.0 - 10.5 K/uL   RBC 5.02 3.87 - 5.11 MIL/uL   Hemoglobin 14.9 12.0 - 15.0 g/dL   HCT 45.5 36.0 - 46.0 %   MCV 90.6 80.0 - 100.0 fL   MCH 29.7 26.0 - 34.0 pg   MCHC 32.7 30.0 - 36.0 g/dL   RDW 13.1 11.5 - 15.5 %   Platelets 293 150 - 400 K/uL   nRBC 0.0 0.0 - 0.2 %   Neutrophils Relative % 76 %   Neutro Abs 7.4 1.7 - 7.7 K/uL   Lymphocytes Relative 15 %   Lymphs Abs 1.5 0.7 - 4.0 K/uL   Monocytes Relative 8 %   Monocytes Absolute 0.8 0.1 - 1.0 K/uL   Eosinophils Relative 1 %   Eosinophils Absolute 0.1 0.0 - 0.5 K/uL   Basophils Relative 0 %   Basophils Absolute 0.0 0.0 - 0.1 K/uL   Immature Granulocytes 0 %   Abs Immature Granulocytes 0.02 0.00 - 0.07 K/uL    Comment: Performed at [facility], [address] [place], [place] [address]  Basic metabolic panel     Status: Abnormal   Collection Time: 06/02/18  6:48 AM  Result Value Ref Range   Sodium 137 135 - 145 mmol/L   Potassium 4.4 3.5 - 5.1 mmol/L   Chloride 103 98 - 111 mmol/L   CO2 26 22 - 32 mmol/L   Glucose, Bld 107 (H) 70 - 99 mg/dL   BUN 15 8 - 23 mg/dL   Creatinine, Ser 1.02 (H) 0.44 - 1.00 mg/dL   Calcium 8.7 (L) 8.9 - 10.3 mg/dL   GFR calc non Af Amer 57 (L) >60 mL/min   GFR calc Af Amer >60 >60 mL/min   Anion gap 8 5 - 15    Comment: Performed at [facility], [address], [place], [place] [address]  Brain natriuretic peptide     Status: None   Collection Time: 06/02/18  6:48 AM  Result Value Ref Range   B Natriuretic Peptide 40.0 0.0 - 100.0 pg/mL    Comment: Performed at [facility], [address] [place], [place] [address]  Troponin I - ONCE - STAT     Status: None   Collection Time: 06/02/18  6:48 AM   Result Value Ref Range   Troponin I <0.03 <0.03 ng/mL    Comment: Performed at [facility], [address], [place], [place] [address]  TSH     Status: None   Collection Time: 06/02/18  6:48 AM  Result Value Ref Range   TSH 3.088 0.350 - 4.500 uIU/mL    Comment: Performed by a 3rd Generation assay with a functional sensitivity of <=0.01 uIU/mL. Performed at [facility], [address] [place], [place] [address]   Lactic acid, plasma     Status: None   Collection Time: 06/02/18  7:07 AM  Result Value Ref Range   Lactic Acid, Venous 0.6 0.5 - 1.9 mmol/L    Comment: Performed at [facility], [address], [place], [place] [address]  Influenza panel by PCR (type A & B)     Status: Abnormal   Collection Time: 06/02/18  8:05 AM  Result Value Ref Range   Influenza A By PCR POSITIVE (A) NEGATIVE   Influenza B By PCR NEGATIVE NEGATIVE    Comment: (NOTE) The Xpert Xpress Flu assay is intended as an aid in the diagnosis of  influenza and should not be used as a sole basis for treatment.  This  assay is FDA approved for nasopharyngeal swab specimens only. Nasal  washings and aspirates are unacceptable for Xpert Xpress Flu testing. Performed at [facility], [address] [place], [place] [address]    Dg Chest 2 View  Result Date: 06/02/2018 CLINICAL DATA:  Shortness of breath and difficulty breathing, productive cough. EXAM: CHEST - 2 VIEW COMPARISON:  Chest x-ray dated 10/12/2015. FINDINGS: The heart size and mediastinal contours are within normal limits. Both lungs are clear. The visualized skeletal structures are unremarkable. IMPRESSION: No active cardiopulmonary disease. No evidence of pneumonia or pulmonary edema. Electronically Signed   By: Franki Cabot M.D.   On: 06/02/2018 08:40    Pending Labs Unresulted Labs (From admission, onward)    Start     Ordered   06/09/18 0500  Creatinine, serum  (enoxaparin (LOVENOX)    CrCl >/= 30 ml/min)  Weekly,   R    Comments:  while on  enoxaparin therapy    06/02/18 1136   06/02/18 1137  HIV antibody (Routine Testing)  Add-on,   R  06/02/18 1136          Vitals/Pain Today's Vitals   06/02/18 1705 06/02/18 1730 06/02/18 1800 06/02/18 1920  BP: (!) 112/44 (!) 98/43 (!) 101/58   Pulse: 99 98 85   Resp: (!) 24 (!) 23 (!) 21   Temp:      TempSrc:      SpO2: 100% 100% 96% 99%  Weight:      Height:        Isolation Precautions Droplet precaution  Medications Medications  ALPRAZolam (XANAX) tablet 0.5 mg (has no administration in time range)  pantoprazole (PROTONIX) EC tablet 40 mg (40 mg Oral Given 06/02/18 1239)  cholecalciferol (VITAMIN D3) tablet 1,000 Units (1,000 Units Oral Given 06/02/18 1320)  multivitamin with minerals tablet 1 tablet (1 tablet Oral Given 06/02/18 1239)  loratadine (CLARITIN) tablet 10 mg (10 mg Oral Given 06/02/18 1239)  enoxaparin (LOVENOX) injection 40 mg (40 mg Subcutaneous Given 06/02/18 1239)  nicotine (NICODERM CQ - dosed in mg/24 hours) patch 21 mg (has no administration in time range)  acetaminophen (TYLENOL) tablet 650 mg (has no administration in time range)    Or  acetaminophen (TYLENOL) suppository 650 mg (has no administration in time range)  polyethylene glycol (MIRALAX / GLYCOLAX) packet 17 g (has no administration in time range)  ondansetron (ZOFRAN) tablet 4 mg ( Oral See Alternative 06/02/18 1256)    Or  ondansetron (ZOFRAN) injection 4 mg (4 mg Intravenous Given 06/02/18 1256)  methylPREDNISolone sodium succinate (SOLU-MEDROL) 125 mg/2 mL injection 60 mg (60 mg Intravenous Given 06/02/18 1407)  ipratropium-albuterol (DUONEB) 0.5-2.5 (3) MG/3ML nebulizer solution 3 mL (3 mLs Nebulization Given 06/02/18 1920)  guaiFENesin (MUCINEX) 12 hr tablet 1,200 mg (1,200 mg Oral Given 06/02/18 1320)  dextromethorphan (DELSYM) 30 MG/5ML liquid 60 mg (60 mg Oral Given 06/02/18 1320)  traZODone (DESYREL) tablet 50 mg (has no administration in time range)  famotidine (PEPCID) tablet 20 mg  (has no administration in time range)  oseltamivir (TAMIFLU) capsule 30 mg (has no administration in time range)  methylPREDNISolone sodium succinate (SOLU-MEDROL) 125 mg/2 mL injection 125 mg (125 mg Intravenous Given 06/02/18 0724)  ipratropium-albuterol (DUONEB) 0.5-2.5 (3) MG/3ML nebulizer solution 3 mL (3 mLs Nebulization Given 06/02/18 0837)  albuterol (PROVENTIL) (2.5 MG/3ML) 0.083% nebulizer solution 2.5 mg (2.5 mg Nebulization Given 06/02/18 0836)  ipratropium-albuterol (DUONEB) 0.5-2.5 (3) MG/3ML nebulizer solution 3 mL (3 mLs Nebulization Given 06/02/18 0920)  oseltamivir (TAMIFLU) capsule 75 mg (75 mg Oral Given 06/02/18 1055)    Mobility walks

## 2018-06-02 NOTE — ED Provider Notes (Signed)
8:10   patient developed productive cough with yellow sputum and shortness of breath last night..  No fever.  On exam she is in no respiratory distress while on nasal prong oxygen.  Speaks in paragraphs.  Lungs with diffuse scant rhonchi,. Chest x-ray viewed by me  10:05 a.m. after 3 nebulized treatments, and Solu-Medrol patient's pulse oximetry desaturated to 87% on room air consistent with hypoxia.  Her breathing she reports is somewhat improved but not at baseline.  Work remarkable for positive influenza A Tamiflu ordered   Results for orders placed or performed during the hospital encounter of 06/02/18  CBC with Differential/Platelet  Result Value Ref Range   WBC 9.8 4.0 - 10.5 K/uL   RBC 5.02 3.87 - 5.11 MIL/uL   Hemoglobin 14.9 12.0 - 15.0 g/dL   HCT 45.5 36.0 - 46.0 %   MCV 90.6 80.0 - 100.0 fL   MCH 29.7 26.0 - 34.0 pg   MCHC 32.7 30.0 - 36.0 g/dL   RDW 13.1 11.5 - 15.5 %   Platelets 293 150 - 400 K/uL   nRBC 0.0 0.0 - 0.2 %   Neutrophils Relative % 76 %   Neutro Abs 7.4 1.7 - 7.7 K/uL   Lymphocytes Relative 15 %   Lymphs Abs 1.5 0.7 - 4.0 K/uL   Monocytes Relative 8 %   Monocytes Absolute 0.8 0.1 - 1.0 K/uL   Eosinophils Relative 1 %   Eosinophils Absolute 0.1 0.0 - 0.5 K/uL   Basophils Relative 0 %   Basophils Absolute 0.0 0.0 - 0.1 K/uL   Immature Granulocytes 0 %   Abs Immature Granulocytes 0.02 0.00 - 0.07 K/uL  Basic metabolic panel  Result Value Ref Range   Sodium 137 135 - 145 mmol/L   Potassium 4.4 3.5 - 5.1 mmol/L   Chloride 103 98 - 111 mmol/L   CO2 26 22 - 32 mmol/L   Glucose, Bld 107 (H) 70 - 99 mg/dL   BUN 15 8 - 23 mg/dL   Creatinine, Ser 1.02 (H) 0.44 - 1.00 mg/dL   Calcium 8.7 (L) 8.9 - 10.3 mg/dL   GFR calc non Af Amer 57 (L) >60 mL/min   GFR calc Af Amer >60 >60 mL/min   Anion gap 8 5 - 15  Brain natriuretic peptide  Result Value Ref Range   B Natriuretic Peptide 40.0 0.0 - 100.0 pg/mL  Lactic acid, plasma  Result Value Ref Range   Lactic  Acid, Venous 0.6 0.5 - 1.9 mmol/L  Troponin I - ONCE - STAT  Result Value Ref Range   Troponin I <0.03 <0.03 ng/mL  Influenza panel by PCR (type A & B)  Result Value Ref Range   Influenza A By PCR POSITIVE (A) NEGATIVE   Influenza B By PCR NEGATIVE NEGATIVE   Dg Chest 2 View  Result Date: 06/02/2018 CLINICAL DATA:  Shortness of breath and difficulty breathing, productive cough. EXAM: CHEST - 2 VIEW COMPARISON:  Chest x-ray dated 10/12/2015. FINDINGS: The heart size and mediastinal contours are within normal limits. Both lungs are clear. The visualized skeletal structures are unremarkable. IMPRESSION: No active cardiopulmonary disease. No evidence of pneumonia or pulmonary edema. Electronically Signed   By: Franki Cabot M.D.   On: 06/02/2018 08:40  Patient has oxygen requirement therefore will require overnight stay  Diagnosis #1 influenza type A #2 hypoxia CRITICAL CARE Performed by: Orlie Dakin Total critical care time: 30 minutes Critical care time was exclusive of separately billable procedures and treating  other patients. Critical care was necessary to treat or prevent imminent or life-threatening deterioration. Critical care was time spent personally by me on the following activities: development of treatment plan with patient and/or surrogate as well as nursing, discussions with consultants, evaluation of patient's response to treatment, examination of patient, obtaining history from patient or surrogate, ordering and performing treatments and interventions, ordering and review of laboratory studies, ordering and review of radiographic studies, pulse oximetry and re-evaluation of patient's condition.   Orlie Dakin, MD 06/02/18 1023

## 2018-06-02 NOTE — ED Notes (Signed)
Placed taken off of O2 for 5 minutes. SpO2 dropped to 86% along with increased respirations of 22.

## 2018-06-02 NOTE — ED Provider Notes (Addendum)
[facility] EMERGENCY DEPARTMENT Provider Note   CSN: [id number] Arrival date & time: 06/02/18  8546     History   Chief Complaint Chief Complaint  Patient presents with  . Shortness of Breath    HPI Lori Lopez is a 68 y.o. female.  Patient presents to the emergency department for evaluation of shortness of breath.  Patient reports that she felt fine yesterday.  She did do some cleaning and was exposed to some dust but did not have any respiratory complaints.  She awakened this morning with cough and shortness of breath.  Patient has not had any fever.  No associated chest pain.     Past Medical History:  Diagnosis Date  . Adjustment disorder with depressed mood 09/08/2009  . Allergic rhinitis due to pollen 09/08/2009  . Anxiety   . Cigarette nicotine dependence, uncomplicated 2/70/3500  . COPD (chronic obstructive pulmonary disease) ([place]) 11/05/2010  . Degeneration of thoracolumbar intervertebral disc 06/13/2013  . 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 Hypothyroidism   . Mixed hyperlipidemia 05/15/2013  . Obesity due to excess calories 05/15/2013  . Tinea unguium 05/15/2013  . Vitamin D deficiency 12/28/2009    Patient Active Problem List   Diagnosis Date Noted  . Dysphagia 10/19/2017  . GERD (gastroesophageal reflux disease) 04/12/2017  . Constipation 03/10/2016  . Rectal bleeding 03/10/2016  . SUI (stress urinary incontinence, female) 10/13/2014    Past Surgical History:  Procedure Laterality Date  . BACK SURGERY    . BIOPSY  11/07/2017   Procedure: BIOPSY;  Surgeon: Danie Binder, MD;  Location: [facility];  Service: Endoscopy;;  gastric biopsy   . CERVICAL DISC SURGERY    . COLONOSCOPY  2008   Dr. Oneida Alar: normal colon, small internal hemorrhoids.   . COLONOSCOPY N/A 04/08/2016   Dr. Oneida Alar: examined portion of ileum normal. two 5-6 mm polyps in descending colon and transverse colon, three 2-4 mm polyps in rectum, (all hyperplastic),  internal and external hemorrhoids. Repeat in 10 years  . ESOPHAGOGASTRODUODENOSCOPY (EGD) WITH PROPOFOL N/A 11/07/2017   Procedure: ESOPHAGOGASTRODUODENOSCOPY (EGD) WITH PROPOFOL;  Surgeon: Danie Binder, MD;  Location: [facility];  Service: Endoscopy;  Laterality: N/A;  1:30pm  . SAVORY DILATION N/A 11/07/2017   Procedure: SAVORY DILATION;  Surgeon: Danie Binder, MD;  Location: [facility];  Service: Endoscopy;  Laterality: N/A;     OB History   No obstetric history on file.      Home Medications    Prior to Admission medications   Medication Sig Start Date End Date Taking? Authorizing Provider  ALPRAZolam Duanne Moron) 1 MG tablet Take 0.25-1 mg by mouth daily as needed for anxiety.  02/10/16   [provider]  aspirin 81 MG tablet Take 81 mg by mouth daily.    [provider]  cetirizine (ZYRTEC) 10 MG tablet Take 10 mg by mouth daily.    [provider]  Cholecalciferol (VITAMIN D3) 1000 units CAPS Take 1 capsule by mouth daily.     [provider]  diphenhydrAMINE (BENADRYL) 25 mg capsule Take 25 mg by mouth daily as needed for itching.    [provider]  gabapentin (NEURONTIN) 300 MG capsule Take 1 capsule (300 mg total) by mouth 3 (three) times daily. 1 pill first day. 1 pill twice a day on the second day, then tid Patient not taking: Reported on 05/23/2018 02/23/16   Davonna Belling, MD  Multiple Vitamin (MULTIVITAMIN WITH MINERALS) TABS tablet  Take 1 tablet by mouth daily.    [provider]  nitroGLYCERIN (NITROSTAT) 0.4 MG SL tablet Place 1 tablet (0.4 mg total) under the tongue every 5 (five) minutes as needed for chest pain. Patient not taking: Reported on 05/23/2018 11/04/14   Herminio Commons, MD  pantoprazole (PROTONIX) 40 MG tablet TAKE 1 TABLET BY MOUTH TWICE DAILY 30  MINUTES  BEFORE  A  MEAL 04/18/18   Mahala Menghini, PA-C    Family History Family History  Problem Relation Age of Onset  . Diabetes Mother     . CAD Mother   . Heart disease Mother   . CAD Father   . Colon cancer Neg Hx   . Colon polyps Neg Hx     Social History Social History   Tobacco Use  . Smoking status: Former Smoker    Packs/day: 0.00    Years: 45.00    Pack years: 0.00    Types: Cigarettes    Last attempt to quit: 05/26/2018    Years since quitting: 0.0  . Smokeless tobacco: Never Used  . Tobacco comment: 1 pack per week  Substance Use Topics  . Alcohol use: No    Alcohol/week: 0.0 standard drinks  . Drug use: No     Allergies   Shrimp [shellfish allergy]; Hydrocodone; and Lyrica [pregabalin]   Review of Systems Review of Systems  Respiratory: Positive for cough and shortness of breath.   All other systems reviewed and are negative.    Physical Exam Updated Vital Signs BP (!) 142/68 (BP Location: Left Arm)   Pulse (!) 109   Temp 98.2 F (36.8 C) (Oral)   Resp (!) 24   Ht 5\' 2"  (1.575 m)   Wt 73.5 kg   SpO2 91%   BMI 29.63 kg/m   Physical Exam Vitals signs and nursing note reviewed.  Constitutional:      General: She is not in acute distress.    Appearance: Normal appearance. She is well-developed.  HENT:     Head: Normocephalic and atraumatic.     Right Ear: Hearing normal.     Left Ear: Hearing normal.     Nose: Nose normal.  Eyes:     Conjunctiva/sclera: Conjunctivae normal.     Pupils: Pupils are equal, round, and reactive to light.  Neck:     Musculoskeletal: Normal range of motion and neck supple.  Cardiovascular:     Rate and Rhythm: Regular rhythm.     Heart sounds: S1 normal and S2 normal. No murmur. No friction rub. No gallop.   Pulmonary:     Effort: Tachypnea present.     Breath sounds: Decreased breath sounds and rhonchi present.  Chest:     Chest wall: No tenderness.  Abdominal:     General: Bowel sounds are normal.     Palpations: Abdomen is soft.     Tenderness: There is no abdominal tenderness. There is no guarding or rebound. Negative signs include  Murphy's sign and McBurney's sign.     Hernia: No hernia is present.  Musculoskeletal: Normal range of motion.  Skin:    General: Skin is warm and dry.     Findings: No rash.  Neurological:     Mental Status: She is alert and oriented to person, place, and time.     GCS: GCS eye subscore is 4. GCS verbal subscore is 5. GCS motor subscore is 6.     Cranial Nerves: No cranial nerve deficit.  Sensory: No sensory deficit.     Coordination: Coordination normal.  Psychiatric:        Speech: Speech normal.        Behavior: Behavior normal.        Thought Content: Thought content normal.      ED Treatments / Results  Labs (all labs ordered are listed, but only abnormal results are displayed) Labs Reviewed  CBC WITH DIFFERENTIAL/PLATELET  BASIC METABOLIC PANEL  BRAIN NATRIURETIC PEPTIDE  LACTIC ACID, PLASMA  TROPONIN I    EKG EKG Interpretation  Date/Time:  Saturday June 02 2018 06:47:06 EST Ventricular Rate:  103 PR Interval:    QRS Duration: 101 QT Interval:  339 QTC Calculation: 444 R Axis:   82 Text Interpretation:  Sinus tachycardia Borderline right axis deviation No significant change since last tracing Confirmed by Orpah Greek [phone]) on 06/02/2018 7:36:58 AM   Radiology No results found.  Procedures Procedures (including critical care time)  Medications Ordered in ED Medications  methylPREDNISolone sodium succinate (SOLU-MEDROL) 125 mg/2 mL injection 125 mg (has no administration in time range)  ipratropium-albuterol (DUONEB) 0.5-2.5 (3) MG/3ML nebulizer solution 3 mL (has no administration in time range)  albuterol (PROVENTIL) (2.5 MG/3ML) 0.083% nebulizer solution 2.5 mg (has no administration in time range)     Initial Impression / Assessment and Plan / ED Course  I have reviewed the triage vital signs and the nursing notes.  Pertinent labs & imaging results that were available during my care of the patient were reviewed by me and  considered in my medical decision making (see chart for details).     Patient presents for difficulty breathing which began this morning.  Shortness of breath is associated with harsh, deep cough.  Patient's records indicate a history of COPD, however, she is unaware of this diagnosis.  She does not ever use inhalers or nebulizers.  She does have a long smoking history, reports having quit smoking 1 week ago.  Patient was mildly hypoxic at arrival.  She was placed on nasal cannula oxygen with improvement of her oxygenation.  Blood work including cardiac evaluation and chest x-ray ordered.  Will sign out to oncoming ER physician to follow-up on results.  Final Clinical Impressions(s) / ED Diagnoses   Final diagnoses:  Shortness of breath    ED Discharge Orders    None       Abrish Erny, Gwenyth Allegra, MD 06/02/18 9163    Orpah Greek, MD 06/02/18 [phone]

## 2018-06-03 DIAGNOSIS — E039 Hypothyroidism, unspecified: Secondary | ICD-10-CM

## 2018-06-03 LAB — HIV ANTIBODY (ROUTINE TESTING W REFLEX): HIV Screen 4th Generation wRfx: NONREACTIVE

## 2018-06-03 MED ORDER — GUAIFENESIN ER 600 MG PO TB12: 1200.0000 mg | ORAL_TABLET | Freq: Two times a day (BID) | ORAL | 0 refills | Status: DC

## 2018-06-03 MED ORDER — ALBUTEROL SULFATE HFA 108 (90 BASE) MCG/ACT IN AERS: 2.0000 | INHALATION_SPRAY | Freq: Four times a day (QID) | RESPIRATORY_TRACT | 2 refills | Status: DC | PRN

## 2018-06-03 MED ORDER — ALBUTEROL SULFATE (2.5 MG/3ML) 0.083% IN NEBU: 2.5000 mg | INHALATION_SOLUTION | Freq: Four times a day (QID) | RESPIRATORY_TRACT | 12 refills | Status: DC | PRN

## 2018-06-03 MED ORDER — PREDNISONE 10 MG PO TABS: ORAL_TABLET | ORAL | 0 refills | Status: DC

## 2018-06-03 MED ORDER — OSELTAMIVIR PHOSPHATE 30 MG PO CAPS: 30.0000 mg | ORAL_CAPSULE | Freq: Two times a day (BID) | ORAL | 0 refills | Status: DC

## 2018-06-03 NOTE — Care Management Note (Signed)
Case Management Note  Patient Details  Name: Lori Lopez MRN: [id number] Date of Birth: [birth year]  Subjective/Objective:   DME order placed for oxygen.  RNCM placed a call to patient's room and she agrees to home oxygen and [facility] will supply.  Melene Muller with [facility] will have driver deliver O2 set up and delivery system to [facility]  Delivery should arrive to patient's room within 2 hours.  No other needs.  RNCM signed off. Doran Clay RN BSN [phone]                  Action/Plan:   Expected Discharge Date:  06/03/18               Expected Discharge Plan:  Home/Self Care  In-House Referral:     Discharge planning Services  CM Consult  Post Acute Care Choice:  Durable Medical Equipment Choice offered to:  Patient  DME Arranged:  Oxygen DME Agency:  [place]:    [facility] Agency:     Status of Service:  Completed, signed off  If discussed at [place] of Stay Meetings, dates discussed:    Additional Comments:  Shelbie Hutching, RN 06/03/2018, 1:42 PM

## 2018-06-03 NOTE — Progress Notes (Signed)
Patient continues to pace the room and approach staff at the desk about discharge. When entering the room patient continues to yell and stay hostile with nursing staff about leaving. Attempts to called Lori Lopez back with no success. Will try again.

## 2018-06-03 NOTE — Progress Notes (Signed)
SATURATION QUALIFICATIONS: (This note is used to comply with regulatory documentation for home oxygen)  Patient Saturations on Room Air at Rest = 95%  Patient Saturations on Room Air while Ambulating = 87%  Patient Saturations on 2 Liters of oxygen while Ambulating = 98%  Please briefly explain why patient needs home oxygen: Patient oxygen level decreases without supplemental oxygen when ambulating.

## 2018-06-03 NOTE — Progress Notes (Signed)
Patient discharged and asked for IV to be removed because she doe snot want any more medication she wants to leave and does not understand why she does not get any medications. Patient is very upset and yelling and pacing room. Patient states that she does not understand what she will do if she cant take her medication when she gets home and she thinks she will become sick again and end up back in the hospital. Patient continues to yell at nursing staff in a hostile manner. I explain that the discharge medication has been sent to [facility] in [place] and a family member can pick them up as we await to delivery of oxygen, patient continues to aggressively states that I am wrongly taking care of her. Husband is in the room and states that he understand the situation and that his wife gets overwhelmed and anxious. Husband states that he will have someone pick up the medication. Will continue to monitor the patient and await oxygen delivery.

## 2018-06-03 NOTE — Progress Notes (Signed)
Patient waited for oxygen delivery and no one delivery represenative from [facility] had arrived by 1914 so patient and her husband decided to leave and go home. [facility] [phone] was called at  and spoke with Adrian Blackwater about facilitating deliver [phone] patient's home, he was given the information needed and stated he would have the information sent to have the oxygen sent to the patient's house.

## 2018-06-03 NOTE — Discharge Summary (Signed)
Physician Discharge Summary  Lori Lopez QQI:[id number] DOB: 06/14/1950 DOA: 06/02/2018  PCP: Lori Squibb, MD  Admit date: 06/02/2018 Discharge date: 06/03/2018  Admitted From: Home Disposition: Home  Recommendations for Outpatient Follow-up:  1. Recommend formal outpatient pulmonary function test 2. Follow-up with PCP in 1 to 2 weeks  Home Health: Equipment/Devices: Oxygen at 2 L/min, nebulizer machine  Discharge Condition: Stable CODE STATUS: Full code Diet recommendation: Heart healthy  Brief/Interim Summary: 68 year old female with a long history of tobacco use and COPD, who is not on any chronic medications, admitted to the hospital with COPD exacerbation, likely precipitated by influenza A infection.  She was started on Tamiflu, intravenous steroids, bronchodilators.  The patient has substantial improvement within 24 hours and improved faster than anticipated.  By the following day, her tachycardia had improved, she was no longer wheezing and felt that her breathing was approaching baseline.  The patient was ambulated and did have some mild hypoxia which improved with supplemental oxygen.  Home oxygen was arranged and she may need this for the next 1 to 2 weeks.  This can be reevaluated by her primary care physician.  She was continued on her course of prednisone and was provided bronchodilators.  She would benefit from outpatient pulmonary function tests and she reports she is never had these done before.  May benefit from maintenance inhaler such as Symbicort or Advair.  Will defer this to her primary care physician.  Patient otherwise feels ready for discharge home.  Discharge Diagnoses:  Principal Problem:   Acute respiratory failure with hypoxia (HCC) Active Problems:   Constipation   GERD (gastroesophageal reflux disease)   Dysphagia   Influenza A   COPD with acute exacerbation (HCC)   Cigarette nicotine dependence, uncomplicated   Hypothyroidism   COPD (chronic  obstructive pulmonary disease) (HCC)   Sinus tachycardia   GAD (generalized anxiety disorder)   Elevated blood pressure reading    Discharge Instructions  Discharge Instructions    DME Nebulizer machine   Complete by:  As directed    Patient needs a nebulizer to treat with the following condition:  COPD (chronic obstructive pulmonary disease) (HCC)   Diet - low sodium heart healthy   Complete by:  As directed    Increase activity slowly   Complete by:  As directed      Allergies as of 06/03/2018      Reactions   Shrimp [shellfish Allergy] Anaphylaxis   Hydrocodone Hives, Itching   Lyrica [pregabalin] Other (See Comments)   unknown      Medication List    TAKE these medications   albuterol (2.5 MG/3ML) 0.083% nebulizer solution Commonly known as:  PROVENTIL Take 3 mLs (2.5 mg total) by nebulization every 6 (six) hours as needed for wheezing or shortness of breath.   albuterol 108 (90 Base) MCG/ACT inhaler Commonly known as:  PROVENTIL HFA;VENTOLIN HFA Inhale 2 puffs into the lungs every 6 (six) hours as needed for wheezing or shortness of breath.   ALPRAZolam 1 MG tablet Commonly known as:  XANAX Take 0.25-1 mg by mouth daily as needed for anxiety.   cetirizine 10 MG tablet Commonly known as:  ZYRTEC Take 10 mg by mouth daily.   diphenhydrAMINE 25 mg capsule Commonly known as:  BENADRYL Take 25 mg by mouth daily as needed for itching.   guaiFENesin 600 MG 12 hr tablet Commonly known as:  MUCINEX Take 2 tablets (1,200 mg total) by mouth 2 (two) times daily.  multivitamin with minerals Tabs tablet Take 1 tablet by mouth daily.   oseltamivir 30 MG capsule Commonly known as:  TAMIFLU Take 1 capsule (30 mg total) by mouth 2 (two) times daily.   pantoprazole 40 MG tablet Commonly known as:  PROTONIX TAKE 1 TABLET BY MOUTH TWICE DAILY 30  MINUTES  BEFORE  A  MEAL   predniSONE 10 MG tablet Commonly known as:  DELTASONE Take 40mg  po daily for 2 days then 30mg   daily for 2 days then 20mg  daily for 2 days then 10mg  daily for 2 days then stop   Vitamin D3 25 MCG (1000 UT) Caps Take 1 capsule by mouth daily.            Durable Medical Equipment  (From admission, onward)         Start     Ordered   06/03/18 1104  For home use only DME oxygen  Once    Question Answer Comment  Mode or (Route) Nasal cannula   Liters per Minute 2   Frequency Continuous (stationary and portable oxygen unit needed)   Oxygen conserving device Yes   Oxygen delivery system Gas      06/03/18 1103   06/03/18 0000  DME Nebulizer machine    Question:  Patient needs a nebulizer to treat with the following condition  Answer:  COPD (chronic obstructive pulmonary disease) ([name])   06/03/18 1215          Allergies  Allergen Reactions  . Shrimp [Shellfish Allergy] Anaphylaxis  . Hydrocodone Hives and Itching  . Lyrica [Pregabalin] Other (See Comments)    unknown    Consultations:     Procedures/Studies: Dg Chest 2 View  Result Date: 06/02/2018 CLINICAL DATA:  Shortness of breath and difficulty breathing, productive cough. EXAM: CHEST - 2 VIEW COMPARISON:  Chest x-ray dated 10/12/2015. FINDINGS: The heart size and mediastinal contours are within normal limits. Both lungs are clear. The visualized skeletal structures are unremarkable. IMPRESSION: No active cardiopulmonary disease. No evidence of pneumonia or pulmonary edema. Electronically Signed   By: Franki Cabot M.D.   On: 06/02/2018 08:40       Subjective: Overall breathing is better.  No longer wheezing or coughing.  Able to ambulate without significant shortness of breath.  Discharge Exam: Vitals:   06/03/18 0444 06/03/18 0552 06/03/18 0754 06/03/18 1409  BP: (!) 102/53 125/60    Pulse: 84 79    Resp: 16 18    Temp: 98 F (36.7 C) 98.1 F (36.7 C)    TempSrc: Oral Oral    SpO2: 96% 93% 94% 94%  Weight:      Height:        General: Pt is alert, awake, not in acute  distress Cardiovascular: RRR, S1/S2 +, no rubs, no gallops Respiratory: CTA bilaterally, no wheezing, no rhonchi Abdominal: Soft, NT, ND, bowel sounds + Extremities: no edema, no cyanosis    The results of significant diagnostics from this hospitalization (including imaging, microbiology, ancillary and laboratory) are listed below for reference.     Microbiology: No results found for this or any previous visit (from the past 240 hour(s)).   Labs: BNP (last 3 results) Recent Labs    06/02/18 0648  BNP 11.9   Basic Metabolic Panel: Recent Labs  Lab 06/02/18 0648  NA 137  K 4.4  CL 103  CO2 26  GLUCOSE 107*  BUN 15  CREATININE 1.02*  CALCIUM 8.7*   Liver Function Tests: No results  for input(s): AST, ALT, ALKPHOS, BILITOT, PROT, ALBUMIN in the last 168 hours. No results for input(s): LIPASE, AMYLASE in the last 168 hours. No results for input(s): AMMONIA in the last 168 hours. CBC: Recent Labs  Lab 06/02/18 0648  WBC 9.8  NEUTROABS 7.4  HGB 14.9  HCT 45.5  MCV 90.6  PLT 293   Cardiac Enzymes: Recent Labs  Lab 06/02/18 0648  TROPONINI <0.03   BNP: Invalid input(s): POCBNP CBG: No results for input(s): GLUCAP in the last 168 hours. D-Dimer No results for input(s): DDIMER in the last 72 hours. Hgb A1c No results for input(s): HGBA1C in the last 72 hours. Lipid Profile No results for input(s): CHOL, HDL, LDLCALC, TRIG, CHOLHDL, LDLDIRECT in the last 72 hours. Thyroid function studies Recent Labs    06/02/18 0648  TSH 3.088   Anemia work up No results for input(s): VITAMINB12, FOLATE, FERRITIN, TIBC, IRON, RETICCTPCT in the last 72 hours. Urinalysis    Component Value Date/Time   COLORURINE STRAW (A) 04/06/2016 0928   APPEARANCEUR CLEAR 04/06/2016 0928   LABSPEC <1.005 (L) 04/06/2016 0928   PHURINE 6.0 04/06/2016 0928   GLUCOSEU NEGATIVE 04/06/2016 0928   HGBUR SMALL (A) 04/06/2016 0928   BILIRUBINUR NEGATIVE 04/06/2016 0928   KETONESUR  NEGATIVE 04/06/2016 0928   PROTEINUR NEGATIVE 04/06/2016 0928   NITRITE NEGATIVE 04/06/2016 0928   LEUKOCYTESUR NEGATIVE 04/06/2016 0928   Sepsis Labs Invalid input(s): PROCALCITONIN,  WBC,  LACTICIDVEN Microbiology No results found for this or any previous visit (from the past 240 hour(s)).   Time coordinating discharge: 9mins  SIGNED:   Kathie Dike, MD  [facility] 06/03/2018, 8:35 PM   If 7PM-7AM, please contact night-coverage [email or web address]

## 2018-06-03 NOTE — Progress Notes (Signed)
Family member called and asked why patient had not been discharged, explained that we were awaiting oxygen arrival. Called Mr. Arnoldo Morale again for ETA of arrival and he stated that he would call the driver and call me back. Awaiting his call.

## 2018-06-03 NOTE — Progress Notes (Signed)
Lori Lopez from [facility] was contacted and stated that deliver would be around 1800. Patient and husband were updated about time of arrival. Both stated that they understood and were pleasant.

## 2018-06-08 DIAGNOSIS — J449 Chronic obstructive pulmonary disease, unspecified: Secondary | ICD-10-CM | POA: Diagnosis not present

## 2018-06-11 DIAGNOSIS — J441 Chronic obstructive pulmonary disease with (acute) exacerbation: Secondary | ICD-10-CM | POA: Diagnosis not present

## 2018-06-11 DIAGNOSIS — J09X9 Influenza due to identified novel influenza A virus with other manifestations: Secondary | ICD-10-CM | POA: Diagnosis not present

## 2018-07-04 DIAGNOSIS — J449 Chronic obstructive pulmonary disease, unspecified: Secondary | ICD-10-CM | POA: Diagnosis not present

## 2018-09-05 DIAGNOSIS — Z0000 Encounter for general adult medical examination without abnormal findings: Secondary | ICD-10-CM | POA: Diagnosis not present

## 2018-10-30 ENCOUNTER — Encounter: Payer: Self-pay | Admitting: Gastroenterology

## 2018-12-12 DIAGNOSIS — J449 Chronic obstructive pulmonary disease, unspecified: Secondary | ICD-10-CM | POA: Diagnosis not present

## 2019-01-22 ENCOUNTER — Other Ambulatory Visit: Payer: Self-pay | Admitting: Gastroenterology

## 2019-01-24 DIAGNOSIS — R319 Hematuria, unspecified: Secondary | ICD-10-CM | POA: Diagnosis not present

## 2019-01-24 DIAGNOSIS — F411 Generalized anxiety disorder: Secondary | ICD-10-CM | POA: Diagnosis not present

## 2019-01-24 DIAGNOSIS — E875 Hyperkalemia: Secondary | ICD-10-CM | POA: Diagnosis not present

## 2019-01-24 DIAGNOSIS — E782 Mixed hyperlipidemia: Secondary | ICD-10-CM | POA: Diagnosis not present

## 2019-01-24 DIAGNOSIS — G629 Polyneuropathy, unspecified: Secondary | ICD-10-CM | POA: Diagnosis not present

## 2019-01-24 DIAGNOSIS — Z0000 Encounter for general adult medical examination without abnormal findings: Secondary | ICD-10-CM | POA: Diagnosis not present

## 2019-01-24 DIAGNOSIS — K219 Gastro-esophageal reflux disease without esophagitis: Secondary | ICD-10-CM | POA: Diagnosis not present

## 2019-01-24 DIAGNOSIS — N760 Acute vaginitis: Secondary | ICD-10-CM | POA: Diagnosis not present

## 2019-01-24 DIAGNOSIS — Z6829 Body mass index (BMI) 29.0-29.9, adult: Secondary | ICD-10-CM | POA: Diagnosis not present

## 2019-02-02 DIAGNOSIS — J449 Chronic obstructive pulmonary disease, unspecified: Secondary | ICD-10-CM | POA: Diagnosis not present

## 2019-03-04 DIAGNOSIS — J449 Chronic obstructive pulmonary disease, unspecified: Secondary | ICD-10-CM | POA: Diagnosis not present

## 2019-03-05 DIAGNOSIS — J069 Acute upper respiratory infection, unspecified: Secondary | ICD-10-CM | POA: Diagnosis not present

## 2019-03-06 ENCOUNTER — Other Ambulatory Visit: Payer: Self-pay

## 2019-03-06 DIAGNOSIS — Z20822 Contact with and (suspected) exposure to covid-19: Secondary | ICD-10-CM

## 2019-03-07 LAB — NOVEL CORONAVIRUS, NAA: SARS-CoV-2, NAA: NOT DETECTED

## 2019-04-02 ENCOUNTER — Other Ambulatory Visit (HOSPITAL_COMMUNITY): Payer: Self-pay | Admitting: Internal Medicine

## 2019-04-02 DIAGNOSIS — Z1231 Encounter for screening mammogram for malignant neoplasm of breast: Secondary | ICD-10-CM

## 2019-04-04 DIAGNOSIS — J449 Chronic obstructive pulmonary disease, unspecified: Secondary | ICD-10-CM | POA: Diagnosis not present

## 2019-04-10 ENCOUNTER — Ambulatory Visit (HOSPITAL_COMMUNITY): Payer: PPO

## 2019-05-04 DIAGNOSIS — J449 Chronic obstructive pulmonary disease, unspecified: Secondary | ICD-10-CM | POA: Diagnosis not present

## 2019-06-04 DIAGNOSIS — J449 Chronic obstructive pulmonary disease, unspecified: Secondary | ICD-10-CM | POA: Diagnosis not present

## 2019-07-16 DIAGNOSIS — Z0000 Encounter for general adult medical examination without abnormal findings: Secondary | ICD-10-CM | POA: Diagnosis not present

## 2019-07-16 DIAGNOSIS — R319 Hematuria, unspecified: Secondary | ICD-10-CM | POA: Diagnosis not present

## 2019-07-16 DIAGNOSIS — N760 Acute vaginitis: Secondary | ICD-10-CM | POA: Diagnosis not present

## 2019-07-16 DIAGNOSIS — Z6829 Body mass index (BMI) 29.0-29.9, adult: Secondary | ICD-10-CM | POA: Diagnosis not present

## 2019-07-16 DIAGNOSIS — E782 Mixed hyperlipidemia: Secondary | ICD-10-CM | POA: Diagnosis not present

## 2019-07-16 DIAGNOSIS — E875 Hyperkalemia: Secondary | ICD-10-CM | POA: Diagnosis not present

## 2019-07-16 DIAGNOSIS — J069 Acute upper respiratory infection, unspecified: Secondary | ICD-10-CM | POA: Diagnosis not present

## 2019-07-16 DIAGNOSIS — G629 Polyneuropathy, unspecified: Secondary | ICD-10-CM | POA: Diagnosis not present

## 2019-07-16 DIAGNOSIS — F411 Generalized anxiety disorder: Secondary | ICD-10-CM | POA: Diagnosis not present

## 2019-07-16 DIAGNOSIS — K219 Gastro-esophageal reflux disease without esophagitis: Secondary | ICD-10-CM | POA: Diagnosis not present

## 2019-07-18 DIAGNOSIS — E782 Mixed hyperlipidemia: Secondary | ICD-10-CM | POA: Diagnosis not present

## 2019-07-18 DIAGNOSIS — G2581 Restless legs syndrome: Secondary | ICD-10-CM | POA: Diagnosis not present

## 2019-07-18 DIAGNOSIS — E870 Hyperosmolality and hypernatremia: Secondary | ICD-10-CM | POA: Diagnosis not present

## 2019-07-18 DIAGNOSIS — K219 Gastro-esophageal reflux disease without esophagitis: Secondary | ICD-10-CM | POA: Diagnosis not present

## 2019-07-18 DIAGNOSIS — E875 Hyperkalemia: Secondary | ICD-10-CM | POA: Diagnosis not present

## 2019-07-18 DIAGNOSIS — F411 Generalized anxiety disorder: Secondary | ICD-10-CM | POA: Diagnosis not present

## 2019-07-18 DIAGNOSIS — Z716 Tobacco abuse counseling: Secondary | ICD-10-CM | POA: Diagnosis not present

## 2019-07-18 DIAGNOSIS — R319 Hematuria, unspecified: Secondary | ICD-10-CM | POA: Diagnosis not present

## 2019-07-18 DIAGNOSIS — Z0001 Encounter for general adult medical examination with abnormal findings: Secondary | ICD-10-CM | POA: Diagnosis not present

## 2019-07-18 DIAGNOSIS — N760 Acute vaginitis: Secondary | ICD-10-CM | POA: Diagnosis not present

## 2019-09-14 ENCOUNTER — Encounter (HOSPITAL_COMMUNITY): Payer: Self-pay | Admitting: Emergency Medicine

## 2019-09-14 ENCOUNTER — Emergency Department (HOSPITAL_COMMUNITY): Payer: PPO

## 2019-09-14 ENCOUNTER — Emergency Department (HOSPITAL_COMMUNITY)
Admission: EM | Admit: 2019-09-14 | Discharge: 2019-09-14 | Disposition: A | Discharge status: Home / Self Care | Payer: PPO | Attending: Emergency Medicine | Admitting: Emergency Medicine

## 2019-09-14 ENCOUNTER — Other Ambulatory Visit: Payer: Self-pay

## 2019-09-14 DIAGNOSIS — F17210 Nicotine dependence, cigarettes, uncomplicated: Secondary | ICD-10-CM | POA: Insufficient documentation

## 2019-09-14 DIAGNOSIS — Z79899 Other long term (current) drug therapy: Secondary | ICD-10-CM | POA: Insufficient documentation

## 2019-09-14 DIAGNOSIS — J449 Chronic obstructive pulmonary disease, unspecified: Secondary | ICD-10-CM | POA: Diagnosis not present

## 2019-09-14 DIAGNOSIS — E039 Hypothyroidism, unspecified: Secondary | ICD-10-CM | POA: Insufficient documentation

## 2019-09-14 DIAGNOSIS — M7121 Synovial cyst of popliteal space [Baker], right knee: Secondary | ICD-10-CM | POA: Diagnosis not present

## 2019-09-14 DIAGNOSIS — M7989 Other specified soft tissue disorders: Secondary | ICD-10-CM | POA: Diagnosis not present

## 2019-09-14 DIAGNOSIS — M79604 Pain in right leg: Secondary | ICD-10-CM | POA: Diagnosis present

## 2019-09-14 NOTE — ED Notes (Signed)
Patient left unit before discharge from nurse to be completed.

## 2019-09-14 NOTE — ED Provider Notes (Signed)
[facility] EMERGENCY DEPARTMENT Provider Note   CSN: [id number] Arrival date & time: 09/14/19  1004     History Chief Complaint  Patient presents with  . Leg Swelling    Lori Lopez is a 69 y.o. female with PMHx HLD, hypothyroidism, COPD who presents to the ED today with gradual onset, constant, worsening, RLE pain x 2 days. Pt also notes swelling to the leg itself.  Patient reports her pain started 2 days ago while she was working outside building a deck however she cannot think of specific injury.  States that since then her leg has begun to swell.  She is try to elevate it at home starting yesterday without relief.  She has not taken anything for her pain specifically.  Has been able to ambulate however states she has to limp s/2 pain. No History of DVT/PE.  No recent prolonged travel or immobilization.  No surgeries.  No trauma to the leg.  Exogenous hormone use.  No active malignancy.  No hemoptysis.  Patient denies fevers, chills, chest pain, shortness of breath, redness to her leg, any other associated symptoms.   The history is provided by the patient and medical records.       Past Medical History:  Diagnosis Date  . Adjustment disorder with depressed mood 09/08/2009  . Allergic rhinitis due to pollen 09/08/2009  . Anxiety   . Cigarette nicotine dependence, uncomplicated Q000111Q  . COPD (chronic obstructive pulmonary disease) ([place]) 11/05/2010  . Degeneration of thoracolumbar intervertebral disc 06/13/2013  . 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 Hypothyroidism   . Mixed hyperlipidemia 05/15/2013  . Obesity due to excess calories 05/15/2013  . Tinea unguium 05/15/2013  . Vitamin D deficiency 12/28/2009    Patient Active Problem List   Diagnosis Date Noted  . Acute respiratory failure with hypoxia ([place]) 06/02/2018  . Influenza A 06/02/2018  . COPD with acute exacerbation ([place]) 06/02/2018  . Hypothyroidism 06/02/2018  . Sinus tachycardia 06/02/2018    . GAD (generalized anxiety disorder) 06/02/2018  . Elevated blood pressure reading 06/02/2018  . Dysphagia 10/19/2017  . GERD (gastroesophageal reflux disease) 04/12/2017  . Constipation 03/10/2016  . Rectal bleeding 03/10/2016  . SUI (stress urinary incontinence, female) 10/13/2014  . COPD (chronic obstructive pulmonary disease) ([place]) 11/05/2010  . Cigarette nicotine dependence, uncomplicated Q000111Q    Past Surgical History:  Procedure Laterality Date  . BACK SURGERY    . BIOPSY  11/07/2017   Procedure: BIOPSY;  Surgeon: Danie Binder, MD;  Location: [facility] END[facility] SU[facility];  Service: Endoscopy;;  gastric biopsy   . CERVICAL DISC SURGERY    . COLONOSCOPY  2008   Dr. Oneida Alar: normal colon, small internal hemorrhoids.   . COLONOSCOPY N/A 04/08/2016   Dr. Oneida Alar: examined portion of ileum normal. two 5-6 mm polyps in descending colon and transverse colon, three 2-4 mm polyps in rectum, (all hyperplastic), internal and external hemorrhoids. Repeat in 10 years  . ESOPHAGOGASTRODUODENOSCOPY (EGD) WITH PROPOFOL N/A 11/07/2017   Procedure: ESOPHAGOGASTRODUODENOSCOPY (EGD) WITH PROPOFOL;  Surgeon: Danie Binder, MD;  Location: [facility];  Service: Endoscopy;  Laterality: N/A;  1:30pm  . SAVORY DILATION N/A 11/07/2017   Procedure: SAVORY DILATION;  Surgeon: Danie Binder, MD;  Location: [facility];  Service: Endoscopy;  Laterality: N/A;     OB History    Gravida  3   Para  3   Term  3   Preterm      AB  Living  2     SAB      TAB      Ectopic      Multiple      Live Births              Family History  Problem Relation Age of Onset  . Diabetes Mother   . CAD Mother   . Heart disease Mother   . CAD Father   . Colon cancer Neg Hx   . Colon polyps Neg Hx     Social History   Tobacco Use  . Smoking status: Current Some Day Smoker    Packs/day: 0.00    Years: 45.00    Pack years: 0.00    Types: Cigarettes    Last attempt to quit: 05/26/2018    Years  since quitting: 1.3  . Smokeless tobacco: Never Used  . Tobacco comment: 1 pack per week  Substance Use Topics  . Alcohol use: No    Alcohol/week: 0.0 standard drinks  . Drug use: No    Home Medications Prior to Admission medications   Medication Sig Start Date End Date Taking? Authorizing Provider  ALPRAZolam Duanne Moron) 1 MG tablet Take 0.25-1 mg by mouth daily as needed for anxiety.  02/10/16  Yes [provider]  cetirizine (ZYRTEC) 10 MG tablet Take 10 mg by mouth daily.   Yes [provider]  Cholecalciferol (VITAMIN D3) 1000 units CAPS Take 1 capsule by mouth daily.    Yes [provider]  diphenhydrAMINE (BENADRYL) 25 mg capsule Take 25 mg by mouth daily as needed for itching.   Yes [provider]  Multiple Vitamin (MULTIVITAMIN WITH MINERALS) TABS tablet Take 1 tablet by mouth daily.   Yes [provider]  pantoprazole (PROTONIX) 40 MG tablet TAKE 1 TABLET BY MOUTH TWICE DAILY FOR  30  MINUTES  BEFORE  A  MEAL 01/22/19  Yes Carlis Stable, NP  guaiFENesin (MUCINEX) 600 MG 12 hr tablet Take 2 tablets (1,200 mg total) by mouth 2 (two) times daily. Patient not taking: Reported on 09/14/2019 06/03/18   Kathie Dike, MD    Allergies    Shrimp [shellfish allergy], Hydrocodone, and Lyrica [pregabalin]  Review of Systems   Review of Systems  Constitutional: Negative for chills and fever.  Respiratory: Negative for shortness of breath.   Cardiovascular: Positive for leg swelling. Negative for chest pain.  Musculoskeletal: Positive for arthralgias.  Skin: Negative for color change and wound.  All other systems reviewed and are negative.   Physical Exam Updated Vital Signs BP (!) 143/61 (BP Location: Right Arm)   Pulse 79   Temp 98 F (36.7 C)   Resp 15   Ht 5\' 2"  (1.575 m)   Wt 72.1 kg   SpO2 90%   BMI 29.08 kg/m   Physical Exam Vitals and nursing note reviewed.  Constitutional:      Appearance: She is not ill-appearing or  diaphoretic.  HENT:     Head: Normocephalic and atraumatic.  Eyes:     Conjunctiva/sclera: Conjunctivae normal.  Cardiovascular:     Rate and Rhythm: Normal rate and regular rhythm.     Pulses: Normal pulses.  Pulmonary:     Effort: Pulmonary effort is normal.     Breath sounds: Normal breath sounds. No wheezing, rhonchi or rales.  Abdominal:     Palpations: Abdomen is soft.     Tenderness: There is no abdominal tenderness.  Musculoskeletal:     Cervical back:  Neck supple.     Comments: RLE mildly more swollen compared to LLE. No overlying skin changes. + TTP to calf. No deformity noted to achilles tendon. No tenderness proximal to knee to distal to ankle. ROM intact to all joints. Compartments soft. Strength and sensation intact throughout.  2+ DP pulse.   Skin:    General: Skin is warm and dry.  Neurological:     Mental Status: She is alert.     ED Results / Procedures / Treatments   Labs (all labs ordered are listed, but only abnormal results are displayed) Labs Reviewed - No data to display  EKG None  Radiology US Venous Img Lower Unilateral Right  Result Date: 09/14/2019 CLINICAL DATA:  Calf pain and swelling EXAM: RIGHT LOWER EXTREMITY VENOUS DOPPLER ULTRASOUND TECHNIQUE: Gray-scale sonography with compression, as well as color and duplex ultrasound, were performed to evaluate the deep venous system(s) from the level of the common femoral vein through the popliteal and proximal calf veins. COMPARISON:  None. FINDINGS: VENOUS Normal compressibility of the common femoral, superficial femoral, and popliteal veins, as well as the visualized calf veins. Visualized portions of profunda femoral vein and great saphenous vein unremarkable. No filling defects to suggest DVT on grayscale or color Doppler imaging. Doppler waveforms show normal direction of venous flow, normal respiratory phasicity and response to augmentation. Limited views of the contralateral common femoral vein are  unremarkable. OTHER 2.3 x 1.6 X 0.8 cm mildly complex cystic collection in the posterior popliteal fossa. Mild subcutaneous edema in the calf and ankle. Limitations: none IMPRESSION: 1. Negative for DVT. 2. Small right Baker's cyst. Electronically Signed   By: Lucrezia Europe M.D.   On: 09/14/2019 12:28    Procedures Procedures (including critical care time)  Medications Ordered in ED Medications - No data to display  ED Course  I have reviewed the triage vital signs and the nursing notes.  Pertinent labs & imaging results that were available during my care of the patient were reviewed by me and considered in my medical decision making (see chart for details).    MDM Rules/Calculators/A&P                      69 year old female who presents to the ED today complaining of atraumatic right lower extremity pain that started 2 days ago with gradual swelling.  No history of DVT/PE.  No chest pain or shortness of breath.  On arrival to the ED patient is afebrile, nontachycardic nontachypneic.  She appears to be in no acute distress.  Her right lower extremity is mildly more swollen compared to the left.  She does have tenderness along the calf itself.  There is no overlying skin changes or increased warmth to suggest cellulitis.  Her legs are the same temperature equally.  She has good distal pulses.  No concern for ischemic leg at this point.  Obtain DVT study and reevaluate.   DVT study negative for DVT. Does show baker's cyst on R side. Will discharge pt home at this time with ortho follow up. RICE therapy and NSAIDs discussed with pt. She is in agreement with plan and stable for discharge home.   This note was prepared using Dragon voice recognition software and may include unintentional dictation errors due to the inherent limitations of voice recognition software.  Final Clinical Impression(s) / ED Diagnoses Final diagnoses:  Synovial cyst of right popliteal space    Rx / DC Orders ED  Discharge Orders  None       Discharge Instructions     Please follow up with orthopedist Dr. Aline Brochure for further evaluation of your Baker's cyst While at home please rest and elevate your leg to reduce swelling/inflammation You can take Ibuprofen and Tylenol as needed for pain       Eustaquio Maize, PA-C 09/14/19 1259    Milton Ferguson, MD 09/19/19 1547

## 2019-09-14 NOTE — ED Triage Notes (Signed)
Patient c/o right leg swelling and pain x2 days with no known injury. Per patient worse with walking. Per patient pain mainly in calf and ankle. Denies any hx of DVT. Patient denies taking any type of anticoagulants.

## 2019-09-14 NOTE — Discharge Instructions (Signed)
Please follow up with orthopedist Dr. Aline Brochure for further evaluation of your Baker's cyst While at home please rest and elevate your leg to reduce swelling/inflammation You can take Ibuprofen and Tylenol as needed for pain

## 2019-09-16 DIAGNOSIS — R2241 Localized swelling, mass and lump, right lower limb: Secondary | ICD-10-CM | POA: Diagnosis not present

## 2019-09-16 DIAGNOSIS — M7121 Synovial cyst of popliteal space [Baker], right knee: Secondary | ICD-10-CM | POA: Diagnosis not present

## 2019-09-18 DIAGNOSIS — M7121 Synovial cyst of popliteal space [Baker], right knee: Secondary | ICD-10-CM | POA: Diagnosis not present

## 2019-09-24 DIAGNOSIS — R590 Localized enlarged lymph nodes: Secondary | ICD-10-CM | POA: Diagnosis not present

## 2019-09-24 DIAGNOSIS — R1010 Upper abdominal pain, unspecified: Secondary | ICD-10-CM | POA: Diagnosis not present

## 2019-09-27 ENCOUNTER — Other Ambulatory Visit: Payer: Self-pay | Admitting: Adult Health Nurse Practitioner

## 2019-09-27 ENCOUNTER — Other Ambulatory Visit (HOSPITAL_COMMUNITY): Payer: Self-pay | Admitting: Adult Health Nurse Practitioner

## 2019-09-27 DIAGNOSIS — R1010 Upper abdominal pain, unspecified: Secondary | ICD-10-CM

## 2019-09-27 DIAGNOSIS — R590 Localized enlarged lymph nodes: Secondary | ICD-10-CM

## 2019-10-09 DIAGNOSIS — M7121 Synovial cyst of popliteal space [Baker], right knee: Secondary | ICD-10-CM | POA: Diagnosis not present

## 2019-10-16 ENCOUNTER — Ambulatory Visit (HOSPITAL_COMMUNITY)
Admission: RE | Admit: 2019-10-16 | Discharge: 2019-10-16 | Disposition: A | Discharge status: Home / Self Care | Payer: PPO | Source: Ambulatory Visit | Attending: Adult Health Nurse Practitioner | Admitting: Adult Health Nurse Practitioner

## 2019-10-16 ENCOUNTER — Other Ambulatory Visit: Payer: Self-pay

## 2019-10-16 DIAGNOSIS — R1010 Upper abdominal pain, unspecified: Secondary | ICD-10-CM

## 2019-10-16 DIAGNOSIS — R590 Localized enlarged lymph nodes: Secondary | ICD-10-CM | POA: Diagnosis not present

## 2019-10-16 DIAGNOSIS — R109 Unspecified abdominal pain: Secondary | ICD-10-CM | POA: Diagnosis not present

## 2019-10-23 DIAGNOSIS — M25561 Pain in right knee: Secondary | ICD-10-CM | POA: Diagnosis not present

## 2019-11-06 DIAGNOSIS — J449 Chronic obstructive pulmonary disease, unspecified: Secondary | ICD-10-CM | POA: Diagnosis not present

## 2019-11-25 DIAGNOSIS — Z716 Tobacco abuse counseling: Secondary | ICD-10-CM | POA: Diagnosis not present

## 2019-11-25 DIAGNOSIS — Z720 Tobacco use: Secondary | ICD-10-CM | POA: Diagnosis not present

## 2019-11-25 DIAGNOSIS — G2581 Restless legs syndrome: Secondary | ICD-10-CM | POA: Diagnosis not present

## 2019-11-25 DIAGNOSIS — G629 Polyneuropathy, unspecified: Secondary | ICD-10-CM | POA: Diagnosis not present

## 2019-11-25 DIAGNOSIS — Z0001 Encounter for general adult medical examination with abnormal findings: Secondary | ICD-10-CM | POA: Diagnosis not present

## 2019-11-25 DIAGNOSIS — J069 Acute upper respiratory infection, unspecified: Secondary | ICD-10-CM | POA: Diagnosis not present

## 2019-11-25 DIAGNOSIS — R1010 Upper abdominal pain, unspecified: Secondary | ICD-10-CM | POA: Diagnosis not present

## 2019-11-25 DIAGNOSIS — R319 Hematuria, unspecified: Secondary | ICD-10-CM | POA: Diagnosis not present

## 2019-11-25 DIAGNOSIS — R590 Localized enlarged lymph nodes: Secondary | ICD-10-CM | POA: Diagnosis not present

## 2019-11-25 DIAGNOSIS — R5382 Chronic fatigue, unspecified: Secondary | ICD-10-CM | POA: Diagnosis not present

## 2019-11-25 DIAGNOSIS — R2241 Localized swelling, mass and lump, right lower limb: Secondary | ICD-10-CM | POA: Diagnosis not present

## 2019-11-25 DIAGNOSIS — Z6829 Body mass index (BMI) 29.0-29.9, adult: Secondary | ICD-10-CM | POA: Diagnosis not present

## 2019-11-25 DIAGNOSIS — N760 Acute vaginitis: Secondary | ICD-10-CM | POA: Diagnosis not present

## 2019-11-25 DIAGNOSIS — M7121 Synovial cyst of popliteal space [Baker], right knee: Secondary | ICD-10-CM | POA: Diagnosis not present

## 2019-11-28 DIAGNOSIS — R5383 Other fatigue: Secondary | ICD-10-CM | POA: Diagnosis not present

## 2019-11-28 DIAGNOSIS — Z720 Tobacco use: Secondary | ICD-10-CM | POA: Diagnosis not present

## 2019-11-28 DIAGNOSIS — E860 Dehydration: Secondary | ICD-10-CM | POA: Diagnosis not present

## 2019-11-28 DIAGNOSIS — R252 Cramp and spasm: Secondary | ICD-10-CM | POA: Diagnosis not present

## 2019-12-02 ENCOUNTER — Other Ambulatory Visit: Payer: Self-pay | Admitting: Internal Medicine

## 2019-12-02 ENCOUNTER — Other Ambulatory Visit (HOSPITAL_COMMUNITY): Payer: Self-pay | Admitting: Internal Medicine

## 2019-12-02 DIAGNOSIS — Z720 Tobacco use: Secondary | ICD-10-CM

## 2019-12-03 ENCOUNTER — Other Ambulatory Visit: Payer: Self-pay

## 2019-12-06 DIAGNOSIS — J449 Chronic obstructive pulmonary disease, unspecified: Secondary | ICD-10-CM | POA: Diagnosis not present

## 2019-12-19 ENCOUNTER — Ambulatory Visit (HOSPITAL_COMMUNITY)
Admission: RE | Admit: 2019-12-19 | Discharge: 2019-12-19 | Disposition: A | Discharge status: Home / Self Care | Payer: PPO | Source: Ambulatory Visit | Attending: Internal Medicine | Admitting: Internal Medicine

## 2019-12-19 ENCOUNTER — Other Ambulatory Visit: Payer: Self-pay

## 2019-12-19 DIAGNOSIS — F17210 Nicotine dependence, cigarettes, uncomplicated: Secondary | ICD-10-CM | POA: Diagnosis not present

## 2019-12-19 DIAGNOSIS — Z122 Encounter for screening for malignant neoplasm of respiratory organs: Secondary | ICD-10-CM | POA: Diagnosis not present

## 2019-12-19 DIAGNOSIS — F17200 Nicotine dependence, unspecified, uncomplicated: Secondary | ICD-10-CM | POA: Diagnosis not present

## 2019-12-19 DIAGNOSIS — Z720 Tobacco use: Secondary | ICD-10-CM

## 2019-12-19 DIAGNOSIS — J439 Emphysema, unspecified: Secondary | ICD-10-CM | POA: Insufficient documentation

## 2019-12-19 DIAGNOSIS — I700 Atherosclerosis of aorta: Secondary | ICD-10-CM | POA: Insufficient documentation

## 2020-01-27 DIAGNOSIS — Z720 Tobacco use: Secondary | ICD-10-CM | POA: Diagnosis not present

## 2020-01-27 DIAGNOSIS — R2241 Localized swelling, mass and lump, right lower limb: Secondary | ICD-10-CM | POA: Diagnosis not present

## 2020-01-27 DIAGNOSIS — Z716 Tobacco abuse counseling: Secondary | ICD-10-CM | POA: Diagnosis not present

## 2020-01-27 DIAGNOSIS — R319 Hematuria, unspecified: Secondary | ICD-10-CM | POA: Diagnosis not present

## 2020-01-27 DIAGNOSIS — Z0001 Encounter for general adult medical examination with abnormal findings: Secondary | ICD-10-CM | POA: Diagnosis not present

## 2020-01-27 DIAGNOSIS — G2581 Restless legs syndrome: Secondary | ICD-10-CM | POA: Diagnosis not present

## 2020-01-27 DIAGNOSIS — E860 Dehydration: Secondary | ICD-10-CM | POA: Diagnosis not present

## 2020-01-27 DIAGNOSIS — R5383 Other fatigue: Secondary | ICD-10-CM | POA: Diagnosis not present

## 2020-01-27 DIAGNOSIS — R1010 Upper abdominal pain, unspecified: Secondary | ICD-10-CM | POA: Diagnosis not present

## 2020-01-27 DIAGNOSIS — R590 Localized enlarged lymph nodes: Secondary | ICD-10-CM | POA: Diagnosis not present

## 2020-01-27 DIAGNOSIS — M7121 Synovial cyst of popliteal space [Baker], right knee: Secondary | ICD-10-CM | POA: Diagnosis not present

## 2020-01-27 DIAGNOSIS — N760 Acute vaginitis: Secondary | ICD-10-CM | POA: Diagnosis not present

## 2020-01-30 DIAGNOSIS — E875 Hyperkalemia: Secondary | ICD-10-CM | POA: Diagnosis not present

## 2020-01-30 DIAGNOSIS — Z720 Tobacco use: Secondary | ICD-10-CM | POA: Diagnosis not present

## 2020-01-30 DIAGNOSIS — E782 Mixed hyperlipidemia: Secondary | ICD-10-CM | POA: Diagnosis not present

## 2020-02-03 DIAGNOSIS — Z20822 Contact with and (suspected) exposure to covid-19: Secondary | ICD-10-CM | POA: Diagnosis not present

## 2020-02-03 DIAGNOSIS — R590 Localized enlarged lymph nodes: Secondary | ICD-10-CM | POA: Diagnosis not present

## 2020-02-03 DIAGNOSIS — Z720 Tobacco use: Secondary | ICD-10-CM | POA: Diagnosis not present

## 2020-02-03 DIAGNOSIS — G2581 Restless legs syndrome: Secondary | ICD-10-CM | POA: Diagnosis not present

## 2020-02-03 DIAGNOSIS — R1010 Upper abdominal pain, unspecified: Secondary | ICD-10-CM | POA: Diagnosis not present

## 2020-02-03 DIAGNOSIS — M7121 Synovial cyst of popliteal space [Baker], right knee: Secondary | ICD-10-CM | POA: Diagnosis not present

## 2020-02-03 DIAGNOSIS — K219 Gastro-esophageal reflux disease without esophagitis: Secondary | ICD-10-CM | POA: Diagnosis not present

## 2020-02-03 DIAGNOSIS — R5383 Other fatigue: Secondary | ICD-10-CM | POA: Diagnosis not present

## 2020-02-03 DIAGNOSIS — R2241 Localized swelling, mass and lump, right lower limb: Secondary | ICD-10-CM | POA: Diagnosis not present

## 2020-02-03 DIAGNOSIS — Z0001 Encounter for general adult medical examination with abnormal findings: Secondary | ICD-10-CM | POA: Diagnosis not present

## 2020-02-03 DIAGNOSIS — N760 Acute vaginitis: Secondary | ICD-10-CM | POA: Diagnosis not present

## 2020-02-03 DIAGNOSIS — R319 Hematuria, unspecified: Secondary | ICD-10-CM | POA: Diagnosis not present

## 2020-02-03 DIAGNOSIS — E860 Dehydration: Secondary | ICD-10-CM | POA: Diagnosis not present

## 2020-02-03 DIAGNOSIS — Z716 Tobacco abuse counseling: Secondary | ICD-10-CM | POA: Diagnosis not present

## 2020-03-09 ENCOUNTER — Other Ambulatory Visit (HOSPITAL_COMMUNITY): Payer: Self-pay | Admitting: Internal Medicine

## 2020-03-09 DIAGNOSIS — Z13820 Encounter for screening for osteoporosis: Secondary | ICD-10-CM

## 2020-03-17 ENCOUNTER — Encounter: Payer: Self-pay | Admitting: Gastroenterology

## 2020-03-17 ENCOUNTER — Other Ambulatory Visit: Payer: Self-pay

## 2020-03-17 ENCOUNTER — Ambulatory Visit: Payer: PPO | Admitting: Gastroenterology

## 2020-03-17 VITALS — BP 141/67 | HR 76 | Temp 96.8°F | Ht 62.0 in | Wt 149.4 lb

## 2020-03-17 DIAGNOSIS — R1901 Right upper quadrant abdominal swelling, mass and lump: Secondary | ICD-10-CM | POA: Insufficient documentation

## 2020-03-17 DIAGNOSIS — K5900 Constipation, unspecified: Secondary | ICD-10-CM | POA: Diagnosis not present

## 2020-03-17 DIAGNOSIS — R1084 Generalized abdominal pain: Secondary | ICD-10-CM | POA: Diagnosis not present

## 2020-03-17 DIAGNOSIS — R109 Unspecified abdominal pain: Secondary | ICD-10-CM | POA: Insufficient documentation

## 2020-03-17 DIAGNOSIS — K219 Gastro-esophageal reflux disease without esophagitis: Secondary | ICD-10-CM | POA: Diagnosis not present

## 2020-03-17 MED ORDER — LUBIPROSTONE 24 MCG PO CAPS: 24.0000 ug | ORAL_CAPSULE | Freq: Two times a day (BID) | ORAL | 5 refills | Status: DC

## 2020-03-17 MED ORDER — HYDROCORTISONE (PERIANAL) 1 % EX CREA: TOPICAL_CREAM | CUTANEOUS | 0 refills | Status: DC

## 2020-03-17 MED ORDER — FAMOTIDINE 20 MG PO TABS: 20.0000 mg | ORAL_TABLET | Freq: Two times a day (BID) | ORAL | 5 refills | Status: DC

## 2020-03-17 NOTE — Progress Notes (Signed)
Primary Care Physician: Celene Squibb, MD  Primary Gastroenterologist:  Elon Alas. Abbey Chatters, DO   Chief Complaint  Patient presents with  . Constipation    straining  . Hemorrhoids    started this morning  . Abdominal Pain    right side    HPI: Lori Lopez is a 69 y.o. female here for follow-up of constipation, GERD, abdominal pain.  Last seen January 2020.  Last colonoscopy December 2017 with multiple hyperplastic polyps.  Surveillance due in 2027.  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  This area appears benign, particularly given his stability since 2017.  Fatty infiltration of the ileocecal valve.  Feels knot under right breast there all the time. Pain in that area off/on especially if straining. Going on for awhile. Eating can sometimes help it. Afraid to eat due to constipation. Dulcolax one at night. Then may repeat in few days. Suppositories every other day or so. Uses glycerin or dulcolax suppositories. BM size of pinky finger. Has BM little bit every day. Hemorrhoid pop out this morning. No melena. Last week a little blood with wiping. Never tried miralax. Tried metamucil did not help, made things worse. Off pantoprazole for few weeks. Heartburn regularly. No dysphagia. Hoarseness is worse. Family doctor stopped her PPI. Tried pepcid but didn't help. Bone density test pending. Has seen ENT in past, moderate posterior laryngeal edema, edematous vocal cords c/w Reinke's edema. Often caused by long term smoking. Acid reflux can contribute.    Current Outpatient Medications  Medication Sig Dispense Refill  . ALPRAZolam (XANAX) 1 MG tablet Take 0.25-1 mg by mouth daily as needed for anxiety.     . bisacodyl (DULCOLAX) 10 MG suppository Place 10 mg rectally as needed for moderate constipation.    . cetirizine (ZYRTEC) 10 MG tablet Take 10 mg by mouth as needed.     .  Cholecalciferol (VITAMIN D3) 1000 units CAPS Take 1 capsule by mouth daily.     . diphenhydrAMINE (BENADRYL) 25 mg capsule Take 25 mg by mouth daily as needed for itching.    . Multiple Vitamin (MULTIVITAMIN WITH MINERALS) TABS tablet Take 1 tablet by mouth daily.     No current facility-administered medications for this visit.    Allergies as of 03/17/2020 - Review Complete 03/17/2020  Allergen Reaction Noted  . Shrimp [shellfish allergy] Anaphylaxis 10/10/2014  . Hydrocodone Hives and Itching 10/10/2014  . Lyrica [pregabalin] Other (See Comments) 10/10/2014    ROS:  General: Negative for anorexia, weight loss, fever, chills, fatigue, weakness. ENT: Negative for difficulty swallowing , nasal congestion. Chronic hoarseness CV: Negative for chest pain, angina, palpitations, dyspnea on exertion, peripheral edema.  Respiratory: Negative for dyspnea at rest, dyspnea on exertion, cough, sputum, wheezing.  GI: See history of present illness. GU:  Negative for dysuria, hematuria, urinary incontinence, urinary frequency, nocturnal urination.  Endo: Negative for unusual weight change.    Physical Examination:   BP (!) 141/67   Pulse 76   Temp (!) 96.8 F (36 C) (Temporal)   Ht 5\' 2"  (1.575 m)   Wt 149 lb 6.4 oz (67.8 kg)   BMI 27.33 kg/m   General: Well-nourished, well-developed in no acute distress.  Eyes: No icterus. Mouth: masked Lungs: Clear to auscultation bilaterally.  Heart: Regular rate and rhythm, no murmurs rubs or gallops.  Abdomen: Bowel sounds are normal, nontender, nondistended, no hepatosplenomegaly or masses, no  abdominal bruits, no rebound or guarding.  Rectus diastases. Small area 2 fingerwidths below RCM in MCL protrudes, bigger with sitting/standing/straining. Rectal: No external hemorrhoids, palpable hemorrhoid in anal canal.  Nontender exam.  No stool present  extremities: No lower extremity edema. No clubbing or deformities. Neuro: Alert and oriented x 4     Skin: Warm and dry, no jaundice.   Psych: Alert and cooperative, normal mood and affect.  Labs:  Lab Results  Component Value Date   TSH 3.088 06/02/2018   Lab Results  Component Value Date   WBC 9.8 06/02/2018   HGB 14.9 06/02/2018   HCT 45.5 06/02/2018   MCV 90.6 06/02/2018   PLT 293 06/02/2018    Imaging Studies: No results found.  Impression/Plan:  69 year old female presenting for follow-up of constipation, GERD, abdominal discomfort.  GERD: Poorly controlled.  Patient reports that PCP stopped her PPI therapy due to concerns for side effects from long-term therapy.  Historically has had difficult to manage reflux.  History of chronic hoarseness, evaluated by ENT several occasions and diagnosed with Reinke's edema which is often noted in chronic smokers but acid reflux can play a role as well.  Patient reports that she has bone density study pending.  We will try scheduled H2 blocker to see if we can get her typical reflux under control.  Rx for Pepcid 20 mg twice daily provided.  If she fails this therapy, we may need to consider going back to at least once daily PPI with H2 blocker at nighttime.  Constipation: Poorly controlled.  Trial of Amitiza 24 mcg twice daily with food.    Hemorrhoid flare: with straining, small amount of bleeding last week.  Hydrocortisone cream 2-3 times daily as needed not to exceed 2 weeks at a time.  Right upper quadrant, small mass.  Almost feels like a lipoma, somewhat more prominent with sitting and standing but does not feel like a true hernia.  Will review CT findings with radiologist.  Fatty infiltration of the ileocecal valve on CT June 2021: Last colonoscopy in 2017.  To discuss further with Dr. Abbey Chatters.  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 Appears asymptomatic.

## 2020-03-17 NOTE — Patient Instructions (Signed)
1. Start Amitiza 24 mcg twice daily with food for constipation. 2. Start Pepcid 20 mg twice daily for reflux. 3. Hydrocortisone cream and rectally 3 times daily as needed for hemorrhoids.  Do not use more than 2 weeks at a time. 4. We will be in touch after I have reviewed your CT scan with the radiologist and spoke with Dr. Abbey Chatters.

## 2020-03-25 ENCOUNTER — Telehealth: Payer: Self-pay

## 2020-03-25 NOTE — Telephone Encounter (Signed)
Pt walked in office this morning. Pt states she received a reminder call stating she had an appointment today 03/25/20. When pt walked in the office she was told that she didn't have an appointment. Pt went home and called our office. I notified pt that her apt was last week 03/17/20 and we didn't have an appointment in the system for her. Pt said she understood and said it maybe was a glitch in the system.

## 2020-03-27 ENCOUNTER — Telehealth: Payer: Self-pay | Admitting: Gastroenterology

## 2020-03-27 NOTE — Telephone Encounter (Signed)
reviewed labs from PCP dated 9/202/21: WBC 8000, Hgb 16.6, Platelet 278000, creatinine 0.87, tbili 0.4, AP 77, AST 14, ALT 11,   I need to review her CT on Monday. Further recommendations after that.  Dr. Abbey Chatters also advises colonoscopy to evaluate fatty appearing ileocecal valve seen on CT.

## 2020-03-30 DIAGNOSIS — R1010 Upper abdominal pain, unspecified: Secondary | ICD-10-CM | POA: Diagnosis not present

## 2020-04-06 DIAGNOSIS — R1010 Upper abdominal pain, unspecified: Secondary | ICD-10-CM | POA: Diagnosis not present

## 2020-04-08 ENCOUNTER — Telehealth: Payer: Self-pay | Admitting: Internal Medicine

## 2020-04-08 ENCOUNTER — Encounter: Payer: Self-pay | Admitting: Gastroenterology

## 2020-04-08 NOTE — Telephone Encounter (Signed)
We can assist with surgery referral if she would like.   Make follow up here in 3 months.

## 2020-04-08 NOTE — Telephone Encounter (Signed)
PATIENT CALLED AND SAID SHE IS STILL HAVING THE SAME ISSUE SHE HAD, ABD PAIN.  STATED LESLIE WAS GOING TO SEND HER TO HAVE A SCAN AND SHE HAS NOT HEARD ANYTHING ABOUT IT [phone]

## 2020-04-08 NOTE — Telephone Encounter (Signed)
Reviewed CT with radiologist, Dr. Jasmine December. The bulging area she has in the right upper abdomen is due to a defect in the abdominal wall muscle. It has been stable since 2017 CT. Little fat noted in the defect but does not contain any bowel. She could discuss with a surgeon to see what options she may have.   Per Dr. Abbey Chatters, he advises colonoscopy to evaluate fatty appearing ileocecal valve seen on CT. Please schedule if pt agreeable. ASA II.

## 2020-04-08 NOTE — Telephone Encounter (Signed)
Phoned and advised of the CT report. I also advised of Dr. Reola Mosher recommendations to have a colonoscopy. Pt states she will look into the surgery options now and later on we will address the colonoscopy.

## 2020-04-08 NOTE — Telephone Encounter (Signed)
Phoned pt and was advised by her she's still having abdomen pain and it has been for 2 or 3 months. She describes the pain as nerve pain which comes and goes. No nausea. She states that you told her you were going to send her to have a scan (pt's words). I looked back through the notes and you had in the notes you would be in touch after you reviewed CT scan with radiologist and spoke with Dr. Abbey Chatters. Please advise.

## 2020-04-08 NOTE — Telephone Encounter (Signed)
FYI: Phoned and advised of the CT report. I also advised of Dr. Reola Mosher recommendations to have a colonoscopy. Pt states she will look into the surgery options now and later on we will address the colonoscopy.

## 2020-04-08 NOTE — Telephone Encounter (Signed)
See other telephone note sent today. Thanks.

## 2020-04-08 NOTE — Telephone Encounter (Signed)
noted 

## 2020-04-09 ENCOUNTER — Encounter: Payer: Self-pay | Admitting: Internal Medicine

## 2020-04-09 NOTE — Telephone Encounter (Signed)
Called pt, she wants to think about seeing surgeon. Advised her to let our office know if she wants to see surgeon and we can send a referral.

## 2020-04-09 NOTE — Telephone Encounter (Signed)
Patient scheduled and letter sent  °

## 2020-04-09 NOTE — Telephone Encounter (Signed)
Noted  

## 2020-04-09 NOTE — Telephone Encounter (Signed)
noted 

## 2020-04-09 NOTE — Telephone Encounter (Signed)
Routed to [place], [place] to schedule

## 2020-04-13 ENCOUNTER — Telehealth: Payer: Self-pay | Admitting: Internal Medicine

## 2020-04-13 DIAGNOSIS — R1901 Right upper quadrant abdominal swelling, mass and lump: Secondary | ICD-10-CM

## 2020-04-13 NOTE — Telephone Encounter (Signed)
Pt returned call about being scheduled for a procedure and that she is ready to proceed. Will route to [place]

## 2020-04-13 NOTE — Telephone Encounter (Signed)
noted 

## 2020-04-13 NOTE — Telephone Encounter (Signed)
Called pt, she didn't want to schedule procedure at this time. She wants referral sent to surgeon (see previous phone note). Wants to hold off on procedure for now until she sees surgery. She prefers [facility]. Referral sent to [facility] via [employer].  FYI to Neil Crouch PA.

## 2020-04-13 NOTE — Telephone Encounter (Signed)
Pt needs to speak with nurse. [phone]

## 2020-05-08 ENCOUNTER — Encounter (HOSPITAL_COMMUNITY): Payer: Self-pay | Admitting: Emergency Medicine

## 2020-05-08 ENCOUNTER — Emergency Department (HOSPITAL_COMMUNITY)
Admission: EM | Admit: 2020-05-08 | Discharge: 2020-05-08 | Disposition: A | Discharge status: Home / Self Care | Payer: PPO | Attending: Emergency Medicine | Admitting: Emergency Medicine

## 2020-05-08 ENCOUNTER — Other Ambulatory Visit: Payer: Self-pay

## 2020-05-08 DIAGNOSIS — Z5321 Procedure and treatment not carried out due to patient leaving prior to being seen by health care provider: Secondary | ICD-10-CM | POA: Insufficient documentation

## 2020-05-08 DIAGNOSIS — M545 Low back pain, unspecified: Secondary | ICD-10-CM | POA: Diagnosis not present

## 2020-05-08 DIAGNOSIS — R109 Unspecified abdominal pain: Secondary | ICD-10-CM | POA: Insufficient documentation

## 2020-05-08 NOTE — ED Triage Notes (Addendum)
Pain to RT side of abd and RT lower back for over a month. Has been seen by pcp.  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  Has appt with her surgeon on 05/22/2019 but is in severe pain.  Pt was given pain pills by pcp but states she has not taken any of them.

## 2020-05-11 ENCOUNTER — Other Ambulatory Visit: Payer: Self-pay

## 2020-05-11 ENCOUNTER — Ambulatory Visit
Admission: EM | Admit: 2020-05-11 | Discharge: 2020-05-11 | Disposition: A | Discharge status: Home / Self Care | Payer: PPO

## 2020-05-11 ENCOUNTER — Encounter: Payer: Self-pay | Admitting: Emergency Medicine

## 2020-05-11 DIAGNOSIS — M25572 Pain in left ankle and joints of left foot: Secondary | ICD-10-CM | POA: Diagnosis not present

## 2020-05-11 DIAGNOSIS — L03116 Cellulitis of left lower limb: Secondary | ICD-10-CM

## 2020-05-11 MED ORDER — CEPHALEXIN 500 MG PO CAPS: 500.0000 mg | ORAL_CAPSULE | Freq: Two times a day (BID) | ORAL | 0 refills | Status: AC

## 2020-05-11 NOTE — ED Triage Notes (Signed)
Pt c/o of pain and redness to left ankle x 1 day. Denies injury.

## 2020-05-11 NOTE — ED Provider Notes (Signed)
[facility]    CSN: [id number] Arrival date & time: 05/11/20  1645      History   Chief Complaint Chief Complaint  Patient presents with  . Ankle Pain  . Abscess    HPI Lori Lopez is a 70 y.o. female.   Reports pain, redness, swelling to L ankle since yesterday. States that she had an open sore to the area and it will not heal. Has been using antibiotic ointment to the area with no relief. There are not aggravating or alleviating factors. Denies headache, cough, nausea, vomiting, diarrhea, fever, rash, other symptoms.  ROS per HPI  The history is provided by the patient.    Past Medical History:  Diagnosis Date  . Adjustment disorder with depressed mood 09/08/2009  . Allergic rhinitis due to pollen 09/08/2009  . Anxiety   . Cigarette nicotine dependence, uncomplicated Q000111Q  . COPD (chronic obstructive pulmonary disease) ([place]) 11/05/2010  . Degeneration of thoracolumbar intervertebral disc 06/13/2013  . 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 Hypothyroidism   . Mixed hyperlipidemia 05/15/2013  . Obesity due to excess calories 05/15/2013  . Tinea unguium 05/15/2013  . Vitamin D deficiency 12/28/2009    Patient Active Problem List   Diagnosis Date Noted  . RUQ abdominal mass 03/17/2020  . Abdominal pain 03/17/2020  . Acute respiratory failure with hypoxia ([place]) 06/02/2018  . Influenza A 06/02/2018  . COPD with acute exacerbation ([place]) 06/02/2018  . Hypothyroidism 06/02/2018  . Sinus tachycardia 06/02/2018  . GAD (generalized anxiety disorder) 06/02/2018  . Elevated blood pressure reading 06/02/2018  . Dysphagia 10/19/2017  . GERD (gastroesophageal reflux disease) 04/12/2017  . Constipation 03/10/2016  . Rectal bleeding 03/10/2016  . SUI (stress urinary incontinence, female) 10/13/2014  . COPD (chronic obstructive pulmonary disease) ([place]) 11/05/2010  . Cigarette nicotine dependence, uncomplicated Q000111Q    Past Surgical  History:  Procedure Laterality Date  . BACK SURGERY    . BIOPSY  11/07/2017   Procedure: BIOPSY;  Surgeon: Danie Binder, MD;  [facility];  Service: Endoscopy;;  gastric biopsy   . CERVICAL DISC SURGERY    . COLONOSCOPY  2008   Dr. Oneida Alar: normal colon, small internal hemorrhoids.   . COLONOSCOPY N/A 04/08/2016   Dr. Oneida Alar: examined portion of ileum normal. two 5-6 mm polyps in descending colon and transverse colon, three 2-4 mm polyps in rectum, (all hyperplastic), internal and external hemorrhoids. Repeat in 10 years  . ESOPHAGOGASTRODUODENOSCOPY (EGD) WITH PROPOFOL N/A 11/07/2017   Dr. Oneida Alar: Normal esophagus status post dilation.  Mild gastritis with benign biopsies  . SAVORY DILATION N/A 11/07/2017   Procedure: SAVORY DILATION;  Surgeon: Danie Binder, MD;  Location: [facility];  Service: Endoscopy;  Laterality: N/A;    OB History    Gravida  3   Para  3   Term  3   Preterm      AB      Living  2     SAB      IAB      Ectopic      Multiple      Live Births               Home Medications    Prior to Admission medications   Medication Sig Start Date End Date Taking? Authorizing Provider  ALPRAZolam Duanne Moron) 1 MG tablet Take 0.25-1 mg by mouth daily as needed for anxiety.  02/10/16  Yes [provider]  aspirin  EC 81 MG tablet Take 81 mg by mouth daily. Swallow whole.   Yes [provider]  bisacodyl (DULCOLAX) 10 MG suppository Place 10 mg rectally as needed for moderate constipation.   Yes [provider]  cephALEXin (KEFLEX) 500 MG capsule Take 1 capsule (500 mg total) by mouth 2 (two) times daily for 7 days. 05/11/20 05/18/20 Yes Faustino Congress, NP  cetirizine (ZYRTEC) 10 MG tablet Take 10 mg by mouth as needed.    Yes [provider]  Cholecalciferol (VITAMIN D3) 1000 units CAPS Take 1 capsule by mouth daily.    Yes [provider]  diphenhydrAMINE (BENADRYL) 25 mg capsule Take 25 mg by mouth  daily as needed for itching.   Yes [provider]  Hydrocortisone, Perianal, (PROCTO-PAK) 1 % CREA Apply anorectally three times daily as needed for hemorrhoids. Do not use more than two weeks at a time. 03/17/20  Yes Mahala Menghini, PA-C  Multiple Vitamin (MULTIVITAMIN WITH MINERALS) TABS tablet Take 1 tablet by mouth daily.   Yes [provider]  pantoprazole (PROTONIX) 40 MG tablet Take 40 mg by mouth 2 (two) times daily.   Yes [provider]  famotidine (PEPCID) 20 MG tablet Take 1 tablet (20 mg total) by mouth 2 (two) times daily. 03/17/20   Mahala Menghini, PA-C  lubiprostone (AMITIZA) 24 MCG capsule Take 1 capsule (24 mcg total) by mouth 2 (two) times daily with a meal. 03/17/20   Mahala Menghini, PA-C    Family History Family History  Problem Relation Age of Onset  . Diabetes Mother   . CAD Mother   . Heart disease Mother   . CAD Father   . Colon cancer Neg Hx   . Colon polyps Neg Hx     Social History Social History   Tobacco Use  . Smoking status: Current Some Day Smoker    Packs/day: 0.50    Years: 45.00    Pack years: 22.50    Types: Cigarettes    Last attempt to quit: 05/26/2018    Years since quitting: 1.9  . Smokeless tobacco: Never Used  Vaping Use  . Vaping Use: Former  . Substances: Flavoring  Substance Use Topics  . Alcohol use: No    Alcohol/week: 0.0 standard drinks  . Drug use: No     Allergies   Shrimp [shellfish allergy], Hydrocodone, and Lyrica [pregabalin]   Review of Systems Review of Systems   Physical Exam Triage Vital Signs ED Triage Vitals  Enc Vitals Group     BP 05/11/20 1724 (!) 151/73     Pulse Rate 05/11/20 1724 79     Resp 05/11/20 1724 20     Temp 05/11/20 1724 98.4 F (36.9 C)     Temp Source 05/11/20 1724 Oral     SpO2 05/11/20 1724 91 %     Weight --      Height --      Head Circumference --      Peak Flow --      Pain Score 05/11/20 1719 5     Pain Loc --      Pain Edu? --      Excl.  in [place]? --    No data found.  Updated Vital Signs BP (!) 151/73 (BP Location: Right Arm)   Pulse 79   Temp 98.4 F (36.9 C) (Oral)   Resp 20   SpO2 91%   Visual Acuity Right Eye Distance:   Left  Eye Distance:   Bilateral Distance:    Right Eye Near:   Left Eye Near:    Bilateral Near:     Physical Exam Vitals and nursing note reviewed.  Constitutional:      General: She is not in acute distress.    Appearance: Normal appearance. She is well-developed and well-nourished. She is not ill-appearing.  HENT:     Head: Normocephalic and atraumatic.  Eyes:     Conjunctiva/sclera: Conjunctivae normal.  Cardiovascular:     Rate and Rhythm: Normal rate and regular rhythm.     Heart sounds: No murmur heard.   Pulmonary:     Effort: Pulmonary effort is normal. No respiratory distress.     Breath sounds: Normal breath sounds.  Abdominal:     Palpations: Abdomen is soft.     Tenderness: There is no abdominal tenderness.  Musculoskeletal:        General: No edema. Normal range of motion.     Cervical back: Normal range of motion and neck supple.  Skin:    General: Skin is warm and dry.     Capillary Refill: Capillary refill takes less than 2 seconds.     Findings: Lesion present.     Comments: See photo    Neurological:     General: No focal deficit present.     Mental Status: She is alert and oriented to person, place, and time.  Psychiatric:        Mood and Affect: Mood and affect and mood normal.        Behavior: Behavior normal.        Thought Content: Thought content normal.        UC Treatments / Results  Labs (all labs ordered are listed, but only abnormal results are displayed) Labs Reviewed - No data to display  EKG   Radiology No results found.  Procedures Procedures (including critical care time)  Medications Ordered in UC Medications - No data to display  Initial Impression / Assessment and Plan / UC Course  I have reviewed the triage vital  signs and the nursing notes.  Pertinent labs & imaging results that were available during my care of the patient were reviewed by me and considered in my medical decision making (see chart for details).     Cellulitis Left ankle pain  Prescribed Keflex BID x 7 days If not improving over the next 48 hours, follow up with this office or with PCP  Follow up in the ER for high fever, trouble swallowing, trouble breathing, other concerning symptoms.   Final Clinical Impressions(s) / UC Diagnoses   Final diagnoses:  Cellulitis of leg, left  Acute left ankle pain     Discharge Instructions     I have sent in Keflex for you to take twice a day for 7 days  If you are not noticing improvement over the next 24 to 48 hours, follow-up with this office or primary care for recheck  Follow-up in the ER for high fever, red streaking up the leg, trouble swallowing, trouble breathing, other concerning symptoms    ED Prescriptions    Medication Sig Dispense Auth. Provider   cephALEXin (KEFLEX) 500 MG capsule Take 1 capsule (500 mg total) by mouth 2 (two) times daily for 7 days. 14 capsule Moshe Cipro, NP     PDMP not reviewed this encounter.   Moshe Cipro, NP 05/14/20 1030

## 2020-05-11 NOTE — Discharge Instructions (Addendum)
I have sent in Keflex for you to take twice a day for 7 days  If you are not noticing improvement over the next 24 to 48 hours, follow-up with this office or primary care for recheck  Follow-up in the ER for high fever, red streaking up the leg, trouble swallowing, trouble breathing, other concerning symptoms

## 2020-05-29 DIAGNOSIS — K432 Incisional hernia without obstruction or gangrene: Secondary | ICD-10-CM | POA: Diagnosis not present

## 2020-07-08 ENCOUNTER — Other Ambulatory Visit: Payer: Self-pay

## 2020-07-08 ENCOUNTER — Encounter: Payer: Self-pay | Admitting: Gastroenterology

## 2020-07-08 ENCOUNTER — Ambulatory Visit (INDEPENDENT_AMBULATORY_CARE_PROVIDER_SITE_OTHER): Payer: PPO | Admitting: Gastroenterology

## 2020-07-08 ENCOUNTER — Encounter: Payer: Self-pay | Admitting: Internal Medicine

## 2020-07-08 VITALS — BP 116/74 | HR 72 | Temp 97.1°F | Ht 62.0 in | Wt 152.4 lb

## 2020-07-08 DIAGNOSIS — K439 Ventral hernia without obstruction or gangrene: Secondary | ICD-10-CM | POA: Diagnosis not present

## 2020-07-08 DIAGNOSIS — R1011 Right upper quadrant pain: Secondary | ICD-10-CM

## 2020-07-08 DIAGNOSIS — K219 Gastro-esophageal reflux disease without esophagitis: Secondary | ICD-10-CM

## 2020-07-08 DIAGNOSIS — K469 Unspecified abdominal hernia without obstruction or gangrene: Secondary | ICD-10-CM | POA: Insufficient documentation

## 2020-07-08 DIAGNOSIS — K5900 Constipation, unspecified: Secondary | ICD-10-CM | POA: Diagnosis not present

## 2020-07-08 MED ORDER — LUBIPROSTONE 8 MCG PO CAPS: 8.0000 ug | ORAL_CAPSULE | Freq: Two times a day (BID) | ORAL | 5 refills | Status: DC

## 2020-07-08 NOTE — Progress Notes (Signed)
Primary Care Physician: Celene Squibb, MD  Primary Gastroenterologist:  Elon Alas. Abbey Chatters, DO   Chief Complaint  Patient presents with  . Abdominal Pain    RUQ constant  . Constipation    Taking amitiza mostly once a day bc BID gives diarrhea  . Nausea    HPI: Lori Lopez is a 70 y.o. female here for follow-up.  Patient last seen in November.  History of constipation, GERD, abdominal pain.  CT abdomen and pelvis in June 2021.  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  This area appears benign, particularly given stability since 2017.  Fatty infiltration of the ileocecal valve.  Had images reevaluated because of suspected right upper quadrant hernia on exam.  Addendum noted, stable defect in the rectus muscles slightly to the right of midline at the level of the anterior liver dome measuring 1.6 cm which contains a small amount of fat.   She is scheduled for hernia repair in April. Patient states her surgeon will not perform her surgery unless she quits smoking. Patient states the longest she has been able to go without smoking is four days. She is afraid that she will not be able to have her surgery.   Stopped pantoprazole prior to her November visit due to PCP concerns for side effects from long-term therapy.  Was complaining of worsening reflux, hoarseness.  Pepcid did not help.  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  "Often caused by long-term smoking.  Acid reflux can contribute."  Bone density study planned last year but patient never followed through.   Colonoscopy December 2017 with multiple hyperplastic polyps.  Surveillance due in 2027.  Today: Patient states her constipation is better on Amitiza 24 mcg once daily but she does not feel she has found the right dose. Typically has diarrhea within two hours of taking Amitiza, so taking one  per day. She has tried managing her constipation with high fiber diet. Did not tolerate miralax due to taste. Fiber caused abdominal pain. Patient notes the her she still has pain at site of "knot" in the RUQ pain. Worse with straining. Have good BM helps her RUQ. She has pain in that area as well that is felt to be neuropathic and she was put on gabapentin. This helps make less sensitive. She states that pain started about six years ago and she wonders if she had Shingles at some point. Does not recall have rash at that location. Continues to have a lot of heartburn. Pepcid did not help. Wants to go back on her pantoprazole.  Of note, Dr. Abbey Chatters recommended updating colonoscopy to evaluate fatty tissue at ICV seen on CT but patient previously declined.    Current Outpatient Medications  Medication Sig Dispense Refill  . ALPRAZolam (XANAX) 1 MG tablet Take 0.25-1 mg by mouth daily as needed for anxiety.     . cetirizine (ZYRTEC) 10 MG tablet Take 10 mg by mouth as needed.     . Cholecalciferol (VITAMIN D3) 1000 units CAPS Take 1 capsule by mouth daily.     . diphenhydrAMINE (BENADRYL) 25 mg capsule Take 25 mg by mouth daily as needed for itching.    . gabapentin (NEURONTIN) 300 MG capsule Take 300 mg by mouth 2 (two) times daily.    . Hydrocortisone, Perianal, (PROCTO-PAK) 1 % CREA Apply anorectally three times daily as needed for  hemorrhoids. Do not use more than two weeks at a time. 28 g 0  . lubiprostone (AMITIZA) 24 MCG capsule Take 1 capsule (24 mcg total) by mouth 2 (two) times daily with a meal. (Patient taking differently: Take 24 mcg by mouth daily with breakfast.) 60 capsule 5  . pantoprazole (PROTONIX) 40 MG tablet Take 40 mg by mouth 2 (two) times daily. (Patient not taking: Reported on 07/08/2020)     No current facility-administered medications for this visit.    Allergies as of 07/08/2020 - Review Complete 07/08/2020  Allergen Reaction Noted  . Shrimp [shellfish allergy] Anaphylaxis  10/10/2014  . Hydrocodone Hives and Itching 10/10/2014  . Lyrica [pregabalin] Other (See Comments) 10/10/2014    ROS:  General: Negative for anorexia, weight loss, fever, chills, fatigue, weakness. ENT: Negative for hoarseness, difficulty swallowing , nasal congestion.chronic hoarsness CV: Negative for chest pain, angina, palpitations, dyspnea on exertion, peripheral edema.  Respiratory: Negative for dyspnea at rest, dyspnea on exertion, cough, sputum, wheezing.  GI: See history of present illness. GU:  Negative for dysuria, hematuria, urinary incontinence, urinary frequency, nocturnal urination.  Endo: Negative for unusual weight change.    Physical Examination:   BP 116/74   Pulse 72   Temp (!) 97.1 F (36.2 C)   Ht 5\' 2"  (1.575 m)   Wt 152 lb 6.4 oz (69.1 kg)   BMI 27.87 kg/m   General: Well-nourished, well-developed in no acute distress.  Eyes: No icterus. Mouth: masked Lungs: Clear to auscultation bilaterally.  Heart: Regular rate and rhythm, no murmurs rubs or gallops.  Abdomen: Bowel sounds are normal, nondistended, no hepatosplenomegaly. Rectus diastasis. Small area in RUQ protrudes with sitting/standing/straining, nontender c/w hernia. No rebound or guarding.    Extremities: No lower extremity edema. No clubbing or deformities. Neuro: Alert and oriented x 4   Skin: Warm and dry, no jaundice.   Psych: Alert and cooperative, normal mood and affect.  Assessment:  Pleasant 70 y/o female presenting for follow up of constipation, GERD, RUQ pain with history of hernia.   GERD: poorly controlled off PPI. History of chronic hoarseness, evaluated by ENT several occasions and diagnosed with Reinke's edema which is often noted in chronic smokers but acid reflux can play a role as well.  H2 blocker without adequate relief. She did not complete bone density study, ordered by PCP due to concern for chronic PPI use. Will resume PPI prn at this time.   Constipation: Amitiza 58mcg  once daily causing diarrhea. Needs dose adjustment.   RUQ pain likely due to abdominal wall hernia. Has seen surgery with plans for surgery if she is able to quit smoking. Patient does not feel she will be successful. She feels like pain is less on gabapentin and when BMs are adequate. She may try supportive measures for now. If adjusting dose of Amitiza and improved management of constipation does not help her pain, she will likely benefit from surgery. She will let us know if she would like referral to different surgical center or [place] to discuss with [facility] regarding her struggle to quit smoking prior to surgery.   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 Patient has declined.   Plan:   1. Stop Amitiza 24 mcg. Start 39mcg once or twice daily for constipaiton. 2. Pantoprazole 40mg  once daily as needed.  3. Colonoscopy advised to evaluate fatty ICV seen on CT but patient has declined.  4. Patient to let me know if she would like for [place]  to reach out to [facility] surgery or refer to another surgical center given her concern that she will not be able to quit smoking and pass the urine test the day of her surgery. She has previously failed nicotine patches, Chantix, quit smoking classed.  5. Return to the office in 6 months or sooner if needed.

## 2020-07-08 NOTE — Patient Instructions (Addendum)
1. Stop Amitiza 24 mcg.  Start 8 mcg once or twice daily for constipation. 2. Pantoprazole 40 mg once daily as needed for reflux. 3. Keep me posted regarding your abdominal pain once your bowel movements have improved.  If you decide that you would like [place] to reach out to [facility] surgery or refer to another surgical center, please let me know. 4. Offer still stands regarding updating your colonoscopy as recommended by Dr. Abbey Chatters to evaluate the fatty tissue seen on CT (at site of the valve connecting your large and small intestines). Let me know if you decide to pursue.  5. Return to our office in 6 months or sooner if needed.

## 2020-07-10 ENCOUNTER — Telehealth: Payer: Self-pay | Admitting: Internal Medicine

## 2020-07-10 NOTE — Telephone Encounter (Signed)
Pt came to the front window and said she was here on Wednesday and Her Pharmacy [facility]) has not received her 2 prescriptions (doesn't know the names). She said one of the prescriptions was going to be over $400. Please advise what needs to be called in. [phone]

## 2020-07-10 NOTE — Telephone Encounter (Signed)
Phoned and LM on pt's vm to return call regarding 2 Rx's being sent to [facility] in [place]. Advised we close early today and Neil Crouch the PA did send in 2 Rx's to her pharmacy.

## 2020-07-11 ENCOUNTER — Encounter: Payer: Self-pay | Admitting: Gastroenterology

## 2020-07-13 ENCOUNTER — Telehealth: Payer: Self-pay | Admitting: Internal Medicine

## 2020-07-13 ENCOUNTER — Telehealth: Payer: Self-pay | Admitting: *Deleted

## 2020-07-13 NOTE — Telephone Encounter (Signed)
Patient had called and left a message on Camille's line at 11:40 AM Friday stating she wasn't going to have her medication for weekend.  I called her back after reading chart and confirmed that the medication was called in.  She said yes the issue is now the cost.  Lubiprostone (Amitiza) and it was 400.00.  She would like a nurse to call her back and see if she could talk and ask some questions.  [phone]  She said she was little frustrated with [facility] and didn't mean to sound so upset on message.

## 2020-07-13 NOTE — Telephone Encounter (Signed)
Forwarded to [facility] Nurse pool

## 2020-07-13 NOTE — Telephone Encounter (Signed)
Patient had called and left a message on Camille's line at 11:40 AM Friday stating she wasn't going to have her medication for weekend.  I called her back after reading chart and confirmed that the medication was called in.  She said yes the issue is now the cost.  Lubiprostone (Amitiza) and it was 400.00.  She would like a nurse to call her back and see if she could talk and ask some questions.  [phone]  She said she was little frustrated with [facility] and didn't mean to sound so upset on message   (Per Reba's note)

## 2020-07-14 ENCOUNTER — Telehealth: Payer: Self-pay | Admitting: Internal Medicine

## 2020-07-14 NOTE — Telephone Encounter (Signed)
Patient had called and left a message on Camille's line at 11:40 AM Friday stating she wasn't going to have her medication for weekend.  I called her back after reading chart and confirmed that the medication was called in. She said yes the issue is now the cost.  Lubiprostone (Amitiza) and it was 400.00.  She would like a nurse to call her back and see if she could talk and ask some questions.  [phone]  She said she was little frustrated with [employer] and didn't mean to sound so upset on message   (Per Reba's note)

## 2020-07-14 NOTE — Telephone Encounter (Signed)
I spoke with this pt and she states the cost of her Amitiza 20mcg is higher than the Amitiza 77mcg. (she has also spoke with Reba as well). She never mentions a second medication just the Amitiza. Please advise.

## 2020-07-14 NOTE — Telephone Encounter (Signed)
Phoned and spoke with the pt and was advised that she does have Pantoprazole at home and as far as the Amitiza 80mcg verses the 24cmg I can only think of one thing that a PA was done on it. Im going to search for the authorization to see if we did one last year and in the mean time pt wants to know what else can she take. Please advise.

## 2020-07-14 NOTE — Telephone Encounter (Signed)
I previously sent in for Amitiza 22mcg BID on 07/08/20. She initially was on 65mcg BID.   Is the cost because she needs a P.A.? We need to investigate the issue because she has been on Amitiza, we just changed dosage.   Also the second medication may be for pantoprazole but I thought she told me she had some at home already, we were resuming. If she needs RX I can send in for her.

## 2020-07-14 NOTE — Telephone Encounter (Signed)
Noted.. already seen and passed to the Dr.

## 2020-07-15 ENCOUNTER — Telehealth: Payer: Self-pay

## 2020-07-15 MED ORDER — LINACLOTIDE 72 MCG PO CAPS: 72.0000 ug | ORAL_CAPSULE | Freq: Every day | ORAL | 5 refills | Status: DC

## 2020-07-15 NOTE — Telephone Encounter (Signed)
See other phone note

## 2020-07-15 NOTE — Telephone Encounter (Signed)
See other telephone note.  

## 2020-07-15 NOTE — Telephone Encounter (Signed)
Lori Lopez,   I spoke with the pharmacist @ [facility] in [place]. The pt hadn't had her Amitiza 81mcg filled since Nov. 9th and it was $45.00. (the pharmacy stated that she still had refills left before the change).   Pharmacist stated the Amitiza 8 mcg needs a PA because it is a non-formulary drug according to the pt's insurance.

## 2020-07-15 NOTE — Telephone Encounter (Signed)
noted 

## 2020-07-15 NOTE — Telephone Encounter (Signed)
It's hard to tell what is on her formulary as Amitiza comes up as preferred drug on our end.  I will send in [place] to see if that is covered. If not, she will need to let us know.

## 2020-07-15 NOTE — Addendum Note (Signed)
Addended by: Mahala Menghini on: 07/15/2020 01:14 PM   Modules accepted: Orders

## 2020-07-16 NOTE — Telephone Encounter (Signed)
Phoned and advised the pt that Neil Crouch had sent in Terry for her , since the Amitiza was too costly for the pt. Pt was ok with that.

## 2020-07-26 ENCOUNTER — Other Ambulatory Visit: Payer: Self-pay

## 2020-07-26 ENCOUNTER — Ambulatory Visit
Admission: EM | Admit: 2020-07-26 | Discharge: 2020-07-26 | Disposition: A | Discharge status: Home / Self Care | Payer: PPO | Attending: Family Medicine | Admitting: Family Medicine

## 2020-07-26 ENCOUNTER — Encounter: Payer: Self-pay | Admitting: Emergency Medicine

## 2020-07-26 DIAGNOSIS — B029 Zoster without complications: Secondary | ICD-10-CM | POA: Diagnosis not present

## 2020-07-26 LAB — POCT URINALYSIS DIP (MANUAL ENTRY)
Bilirubin, UA: NEGATIVE
Blood, UA: NEGATIVE
Glucose, UA: NEGATIVE mg/dL
Ketones, POC UA: NEGATIVE mg/dL
Leukocytes, UA: NEGATIVE
Nitrite, UA: NEGATIVE
Protein Ur, POC: NEGATIVE mg/dL
Spec Grav, UA: 1.01 (ref 1.010–1.025)
Urobilinogen, UA: 0.2 E.U./dL
pH, UA: 5.5 (ref 5.0–8.0)

## 2020-07-26 MED ORDER — DEXAMETHASONE SODIUM PHOSPHATE 10 MG/ML IJ SOLN
10.0000 mg | Freq: Once | INTRAMUSCULAR | Status: AC
  Administered 2020-07-26: 10 mg via INTRAMUSCULAR

## 2020-07-26 MED ORDER — GABAPENTIN 300 MG PO CAPS: ORAL_CAPSULE | ORAL | 0 refills | Status: DC

## 2020-07-26 MED ORDER — VALACYCLOVIR HCL 1 G PO TABS: 1000.0000 mg | ORAL_TABLET | Freq: Two times a day (BID) | ORAL | 0 refills | Status: AC

## 2020-07-26 MED ORDER — TRIAMCINOLONE ACETONIDE 0.1 % EX CREA: 1.0000 "application " | TOPICAL_CREAM | Freq: Two times a day (BID) | CUTANEOUS | 0 refills | Status: DC

## 2020-07-26 MED ORDER — PREDNISONE 20 MG PO TABS
40.0000 mg | ORAL_TABLET | Freq: Every day | ORAL | 0 refills | Status: AC
Start: 2020-07-26 — End: 2020-07-31

## 2020-07-26 NOTE — ED Provider Notes (Signed)
[facility]    CSN: [id number] Arrival date & time: 07/26/20  1302      History   Chief Complaint No chief complaint on file.   HPI Lori Lopez is a 70 y.o. female.   HPI Patient presents today for mid thoracic back pain which radiates under the left breast. characterizes pain as "burning and nerve pain". Symptoms present x 2 days. No fever. No shingles vaccine. No UTI symptoms. History of shingles several years back on the right of back and abdomen. Past Medical History:  Diagnosis Date  . Adjustment disorder with depressed mood 09/08/2009  . Allergic rhinitis due to pollen 09/08/2009  . Anxiety   . Cigarette nicotine dependence, uncomplicated 7/82/9562  . COPD (chronic obstructive pulmonary disease) ([place]) 11/05/2010  . Degeneration of thoracolumbar intervertebral disc 06/13/2013  . 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 Hypothyroidism   . Mixed hyperlipidemia 05/15/2013  . Obesity due to excess calories 05/15/2013  . Tinea unguium 05/15/2013  . Vitamin D deficiency 12/28/2009    Patient Active Problem List   Diagnosis Date Noted  . RUQ pain 07/08/2020  . Abdominal hernia 07/08/2020  . RUQ abdominal mass 03/17/2020  . Abdominal pain 03/17/2020  . Acute respiratory failure with hypoxia ([place]) 06/02/2018  . Influenza A 06/02/2018  . COPD with acute exacerbation ([place]) 06/02/2018  . Hypothyroidism 06/02/2018  . Sinus tachycardia 06/02/2018  . GAD (generalized anxiety disorder) 06/02/2018  . Elevated blood pressure reading 06/02/2018  . Dysphagia 10/19/2017  . GERD (gastroesophageal reflux disease) 04/12/2017  . Constipation 03/10/2016  . Rectal bleeding 03/10/2016  . SUI (stress urinary incontinence, female) 10/13/2014  . COPD (chronic obstructive pulmonary disease) ([place]) 11/05/2010  . Cigarette nicotine dependence, uncomplicated 13/12/6576    Past Surgical History:  Procedure Laterality Date  . BACK SURGERY    . BIOPSY  11/07/2017    Procedure: BIOPSY;  Surgeon: Danie Binder, MD;  [facility]: [facility];  Service: Endoscopy;;  gastric biopsy   . CERVICAL DISC SURGERY    . COLONOSCOPY  2008   Dr. Oneida Alar: normal colon, small internal hemorrhoids.   . COLONOSCOPY N/A 04/08/2016   Dr. Oneida Alar: examined portion of ileum normal. two 5-6 mm polyps in descending colon and transverse colon, three 2-4 mm polyps in rectum, (all hyperplastic), internal and external hemorrhoids. Repeat in 10 years  . ESOPHAGOGASTRODUODENOSCOPY (EGD) WITH PROPOFOL N/A 11/07/2017   Dr. Oneida Alar: Normal esophagus status post dilation.  Mild gastritis with benign biopsies  . SAVORY DILATION N/A 11/07/2017   Procedure: SAVORY DILATION;  Surgeon: Danie Binder, MD;  Location: [facility];  Service: Endoscopy;  Laterality: N/A;    OB History    Gravida  3   Para  3   Term  3   Preterm      AB      Living  2     SAB      IAB      Ectopic      Multiple      Live Births               Home Medications    Prior to Admission medications   Medication Sig Start Date End Date Taking? Authorizing Provider  ALPRAZolam Duanne Moron) 1 MG tablet Take 0.25-1 mg by mouth daily as needed for anxiety.  02/10/16   [provider]  cetirizine (ZYRTEC) 10 MG tablet Take 10 mg by mouth as needed.     [provider]  Cholecalciferol (VITAMIN D3) 1000 units CAPS Take 1 capsule by mouth daily.     [provider]  diphenhydrAMINE (BENADRYL) 25 mg capsule Take 25 mg by mouth daily as needed for itching.    [provider]  gabapentin (NEURONTIN) 300 MG capsule Take 300 mg by mouth 2 (two) times daily.    [provider]  Hydrocortisone, Perianal, (PROCTO-PAK) 1 % CREA Apply anorectally three times daily as needed for hemorrhoids. Do not use more than two weeks at a time. 03/17/20   Mahala Menghini, PA-C  linaclotide [facility]) 72 MCG capsule Take 1 capsule (72 mcg total) by mouth daily before breakfast. 07/15/20   Mahala Menghini, PA-C  pantoprazole (PROTONIX) 40 MG tablet Take 40 mg by mouth 2 (two) times daily. Patient not taking: Reported on 07/08/2020    [provider]    Family History Family History  Problem Relation Age of Onset  . Diabetes Mother   . CAD Mother   . Heart disease Mother   . CAD Father   . Colon cancer Neg Hx   . Colon polyps Neg Hx     Social History Social History   Tobacco Use  . Smoking status: Current Some Day Smoker    Packs/day: 0.50    Years: 45.00    Pack years: 22.50    Types: Cigarettes    Last attempt to quit: 05/26/2018    Years since quitting: 2.1  . Smokeless tobacco: Never Used  Vaping Use  . Vaping Use: Former  . Substances: Flavoring  Substance Use Topics  . Alcohol use: No    Alcohol/week: 0.0 standard drinks  . Drug use: No     Allergies   Shrimp [shellfish allergy], Hydrocodone, and Lyrica [pregabalin]   Review of Systems Review of Systems Pertinent negatives listed in HPI  Physical Exam Triage Vital Signs ED Triage Vitals  Enc Vitals Group     BP 07/26/20 1315 96/62     Pulse Rate 07/26/20 1315 66     Resp 07/26/20 1315 18     Temp 07/26/20 1315 97.7 F (36.5 C)     Temp Source 07/26/20 1315 Oral     SpO2 07/26/20 1315 94 %     Weight --      Height --      Head Circumference --      Peak Flow --      Pain Score 07/26/20 1316 10     Pain Loc --      Pain Edu? --      Excl. in [place]? --    No data found.  Updated Vital Signs BP 96/62 (BP Location: Right Arm)   Pulse 66   Temp 97.7 F (36.5 C) (Oral)   Resp 18   SpO2 94%   Visual Acuity Right Eye Distance:   Left Eye Distance:   Bilateral Distance:    Right Eye Near:   Left Eye Near:    Bilateral Near:     Physical Exam HENT:     Head: Normocephalic.  Cardiovascular:     Rate and Rhythm: Normal rate and regular rhythm.  Pulmonary:     Effort: Pulmonary effort is normal.     Breath sounds: Normal breath sounds and air entry.  Musculoskeletal:        Arms:     Comments: Blistery, zoster type pattern rash, tender to touch   Psychiatric:        Behavior: Behavior is  cooperative.      UC Treatments / Results  Labs (all labs ordered are listed, but only abnormal results are displayed) Labs Reviewed  POCT URINALYSIS DIP (MANUAL ENTRY)    EKG   Radiology No results found.  Procedures Procedures (including critical care time)  Medications Ordered in UC Medications - No data to display  Initial Impression / Assessment and Plan / UC Course  I have reviewed the triage vital signs and the nursing notes.  Pertinent labs & imaging results that were available during my care of the patient were reviewed by me and considered in my medical decision making (see chart for details).     Given blistery, zoster pattern of rash involving the left back and radiating under her right breast. Treatment with prednisone, valtrex, and increase Gabapentin TID for 14 days then resume twice daily.Marland Kitchen PCP follow-up given. Final Clinical Impressions(s) / UC Diagnoses   Final diagnoses:  Herpes zoster without complication   Discharge Instructions   None    ED Prescriptions    Medication Sig Dispense Auth. Provider   gabapentin (NEURONTIN) 300 MG capsule Take daily midday with lunch for shingles pain for 14 days 14 capsule Scot Jun, FNP   predniSONE (DELTASONE) 20 MG tablet Take 2 tablets (40 mg total) by mouth daily with breakfast for 5 days. 10 tablet Scot Jun, FNP   triamcinolone (KENALOG) 0.1 % Apply 1 application topically 2 (two) times daily. 90 g Scot Jun, FNP   valACYclovir (VALTREX) 1000 MG tablet Take 1 tablet (1,000 mg total) by mouth 2 (two) times daily for 14 days. 28 tablet Scot Jun, FNP     PDMP not reviewed this encounter.   Scot Jun, FNP 07/27/20 [phone]

## 2020-07-26 NOTE — ED Triage Notes (Signed)
Left flank pain since Friday.  No known injury.  Denies pain on urination.  States she did push a big box on Thursday with her foot.

## 2020-07-27 ENCOUNTER — Telehealth: Payer: Self-pay | Admitting: Internal Medicine

## 2020-07-27 NOTE — Telephone Encounter (Signed)
FYI: phoned the pt back and asked her concerns, pt stated the bleeding has stopped for now and she didn't know that she should go to the ER. I advised the pt if she is bleeding and in that much pain she needs to go to ER to be evaluated. Pt states bleeding had stopped and that if it starts back up she would go be evaluated. She has an appt with you tomorrow but I advised her if she starts back bleeding and hurting that she needs to go to ER. Once again she agreed.

## 2020-07-27 NOTE — Telephone Encounter (Signed)
Noted  

## 2020-07-27 NOTE — Telephone Encounter (Signed)
Noted. Will address at [facility] tomorrow, ED precautions as previously mentioned.

## 2020-07-27 NOTE — Telephone Encounter (Signed)
Pt called wanting to see Neil Crouch, PA ASAP because she is hurting and passing a lot of blood. I told her if she was passing a lot of blood that she needed to go to the ER. She said the bleeding had stop, but had been up all night because of it and the pain. She said she thought she had a blockage. I offered her OV with Neil Crouch, PA for tomorrow and she is aware. Please call patient at [phone]

## 2020-07-28 ENCOUNTER — Other Ambulatory Visit: Payer: Self-pay

## 2020-07-28 ENCOUNTER — Ambulatory Visit (HOSPITAL_COMMUNITY)
Admission: RE | Admit: 2020-07-28 | Discharge: 2020-07-28 | Disposition: A | Discharge status: Home / Self Care | Payer: PPO | Source: Ambulatory Visit | Attending: Gastroenterology | Admitting: Gastroenterology

## 2020-07-28 ENCOUNTER — Encounter: Payer: Self-pay | Admitting: *Deleted

## 2020-07-28 ENCOUNTER — Ambulatory Visit (INDEPENDENT_AMBULATORY_CARE_PROVIDER_SITE_OTHER): Payer: PPO | Admitting: Gastroenterology

## 2020-07-28 ENCOUNTER — Encounter: Payer: Self-pay | Admitting: Gastroenterology

## 2020-07-28 ENCOUNTER — Other Ambulatory Visit (HOSPITAL_COMMUNITY)
Admission: RE | Admit: 2020-07-28 | Discharge: 2020-07-28 | Disposition: A | Discharge status: Home / Self Care | Payer: PPO | Source: Ambulatory Visit | Attending: Gastroenterology | Admitting: Gastroenterology

## 2020-07-28 VITALS — BP 132/61 | HR 76 | Temp 96.7°F | Ht 62.0 in | Wt 150.4 lb

## 2020-07-28 DIAGNOSIS — K219 Gastro-esophageal reflux disease without esophagitis: Secondary | ICD-10-CM

## 2020-07-28 DIAGNOSIS — R109 Unspecified abdominal pain: Secondary | ICD-10-CM | POA: Diagnosis not present

## 2020-07-28 DIAGNOSIS — R933 Abnormal findings on diagnostic imaging of other parts of digestive tract: Secondary | ICD-10-CM | POA: Insufficient documentation

## 2020-07-28 DIAGNOSIS — K5900 Constipation, unspecified: Secondary | ICD-10-CM

## 2020-07-28 DIAGNOSIS — K625 Hemorrhage of anus and rectum: Secondary | ICD-10-CM | POA: Insufficient documentation

## 2020-07-28 LAB — CBC WITH DIFFERENTIAL/PLATELET
Abs Immature Granulocytes: 0.03 K/uL (ref 0.00–0.07)
Basophils Absolute: 0 K/uL (ref 0.0–0.1)
Basophils Relative: 0 %
Eosinophils Absolute: 0 K/uL (ref 0.0–0.5)
Eosinophils Relative: 0 %
HCT: 44.4 % (ref 36.0–46.0)
Hemoglobin: 14.7 g/dL (ref 12.0–15.0)
Immature Granulocytes: 0 %
Lymphocytes Relative: 12 %
Lymphs Abs: 1.3 K/uL (ref 0.7–4.0)
MCH: 31.1 pg (ref 26.0–34.0)
MCHC: 33.1 g/dL (ref 30.0–36.0)
MCV: 93.9 fL (ref 80.0–100.0)
Monocytes Absolute: 0.2 K/uL (ref 0.1–1.0)
Monocytes Relative: 2 %
Neutro Abs: 9 K/uL — ABNORMAL HIGH (ref 1.7–7.7)
Neutrophils Relative %: 86 %
Platelets: 342 K/uL (ref 150–400)
RBC: 4.73 MIL/uL (ref 3.87–5.11)
RDW: 13.2 % (ref 11.5–15.5)
WBC: 10.5 K/uL (ref 4.0–10.5)
nRBC: 0 % (ref 0.0–0.2)

## 2020-07-28 MED ORDER — PEG 3350-KCL-NA BICARB-NACL 420 G PO SOLR: ORAL | 0 refills | Status: DC

## 2020-07-28 NOTE — Patient Instructions (Addendum)
1. Please go today for lab and x-ray. 2. We will call you with further instructions based on x-ray findings. 3. Colonoscopy as scheduled.  Please see separate instructions. 4. Call with significant bleeding, persistent bleeding, persistent diarrhea.

## 2020-07-28 NOTE — Progress Notes (Signed)
Primary Care Physician:  Celene Squibb, MD  Primary Gastroenterologist:  Elon Alas. Abbey Chatters, DO   Chief Complaint  Patient presents with  . Rectal Bleeding    Bright red; off and on when strains    HPI:  Lori Lopez is a 70 y.o. female here for further evaluation of rectal bleeding.  She was last seen in March 2022 for constipation, GERD, abdominal pain.  CT abdomen and pelvis in June 2021.  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  This area appears benign, particularly given stability since 2017.  Fatty infiltration of the ileocecal valve.  Had images reevaluated because of suspected right upper quadrant hernia on exam.  Addendum noted, stable defect in the rectus muscles slightly to the right of midline at the level of the anterior liver dome measuring 1.6 cm which contains a small amount of fat.   Currently scheduled for hernia repair in April only if she is able to quit smoking per surgeon's request.  Patient was seen back in March she was complaining of ongoing constipation.  Was unable to take Amitiza 24 mcg even once daily because it caused diarrhea.  Unfortunately her insurance would not cover 8 mcg doses.  Was going to be $400.  We sent in Linzess 72 mcg daily.  Patient states that she was on it for about a week.  She has had nothing but diarrhea.  Her hemorrhoids popped out.  Denies rectal pain.  On Sunday she saw blood in her stool on several occasions.  Possible tiny blood clots.  No melena.  She wonders if she is having serious diarrhea because she has passed nothing but watery stools since starting Linzess.  Has passed no solid stools in 1 week.  Because of this she took a bottle of magnesium citrate Sunday.  Had several watery stools Sunday and Monday.  No BM today.  Last episode of rectal bleeding yesterday.   Concerned about change in bowel habits.  She is to have 3 good bowel movements every morning after coffee.  Over the  course of last several months bowel function has changed.  Every other day has a small stool about the size of her fifth finger.  Has tried MiraLAX but did not see significant benefit.  Also having increased reflux off PPI.  H2 blocker without adequate relief.  PCP stopped PPI because of concern for potential side effects.  Last visit encouraged her to use PPI as needed.  Currently also dealing with shingles in the left upper quadrant/left flank/left back.  Was seen in urgent care and started on Valtrex, tramadol, prednisone.  Colonoscopy December 2017 with multiple hyperplastic polyps.  Surveillance due in 2027.  Of note, Dr. Abbey Chatters recommended updating colonoscopy to evaluate fatty tissue at ICD seen on CT recently but patient declined.      Current Outpatient Medications  Medication Sig Dispense Refill  . ALPRAZolam (XANAX) 1 MG tablet Take 0.25-1 mg by mouth daily as needed for anxiety.     . cetirizine (ZYRTEC) 10 MG tablet Take 10 mg by mouth as needed.     . Cholecalciferol (VITAMIN D3) 1000 units CAPS Take 1 capsule by mouth daily.     . diphenhydrAMINE (BENADRYL) 25 mg capsule Take 25 mg by mouth daily as needed for itching.    . gabapentin (NEURONTIN) 300 MG capsule Take 300 mg by mouth 2 (two) times daily.    Marland Kitchen gabapentin (NEURONTIN) 300 MG capsule Take daily midday  with lunch for shingles pain for 14 days 14 capsule 0  . Hydrocortisone, Perianal, (PROCTO-PAK) 1 % CREA Apply anorectally three times daily as needed for hemorrhoids. Do not use more than two weeks at a time. 28 g 0  . predniSONE (DELTASONE) 20 MG tablet Take 2 tablets (40 mg total) by mouth daily with breakfast for 5 days. 10 tablet 0  . triamcinolone (KENALOG) 0.1 % Apply 1 application topically 2 (two) times daily. 90 g 0  . valACYclovir (VALTREX) 1000 MG tablet Take 1 tablet (1,000 mg total) by mouth 2 (two) times daily for 14 days. 28 tablet 0   No current facility-administered medications for this visit.     Allergies as of 07/28/2020 - Review Complete 07/28/2020  Allergen Reaction Noted  . Shrimp [shellfish allergy] Anaphylaxis 10/10/2014  . Hydrocodone Hives and Itching 10/10/2014  . Lyrica [pregabalin] Other (See Comments) 10/10/2014    Past Medical History:  Diagnosis Date  . Adjustment disorder with depressed mood 09/08/2009  . Allergic rhinitis due to pollen 09/08/2009  . Anxiety   . Cigarette nicotine dependence, uncomplicated 1/60/7371  . COPD (chronic obstructive pulmonary disease) ([place]) 11/05/2010  . Degeneration of thoracolumbar intervertebral disc 06/13/2013  . 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 Hypothyroidism   . Mixed hyperlipidemia 05/15/2013  . Obesity due to excess calories 05/15/2013  . Tinea unguium 05/15/2013  . Vitamin D deficiency 12/28/2009    Past Surgical History:  Procedure Laterality Date  . BACK SURGERY    . BIOPSY  11/07/2017   Procedure: BIOPSY;  Surgeon: Danie Binder, MD;  [facility];  Service: Endoscopy;;  gastric biopsy   . CERVICAL DISC SURGERY    . COLONOSCOPY  2008   Dr. Oneida Alar: normal colon, small internal hemorrhoids.   . COLONOSCOPY N/A 04/08/2016   Dr. Oneida Alar: examined portion of ileum normal. two 5-6 mm polyps in descending colon and transverse colon, three 2-4 mm polyps in rectum, (all hyperplastic), internal and external hemorrhoids. Repeat in 10 years  . ESOPHAGOGASTRODUODENOSCOPY (EGD) WITH PROPOFOL N/A 11/07/2017   Dr. Oneida Alar: Normal esophagus status post dilation.  Mild gastritis with benign biopsies  . SAVORY DILATION N/A 11/07/2017   Procedure: SAVORY DILATION;  Surgeon: Danie Binder, MD;  [facility];  Service: Endoscopy;  Laterality: N/A;    Family History  Problem Relation Age of Onset  . Diabetes Mother   . CAD Mother   . Heart disease Mother   . CAD Father   . Colon cancer Neg Hx   . Colon polyps Neg Hx     Social History   Socioeconomic History  . Marital status: Married     Spouse name: Not on file  . Number of children: Not on file  . Years of education: Not on file  . Highest education level: Not on file  Occupational History  . Not on file  Tobacco Use  . Smoking status: Current Some Day Smoker    Packs/day: 0.50    Years: 45.00    Pack years: 22.50    Types: Cigarettes    Last attempt to quit: 05/26/2018    Years since quitting: 2.1  . Smokeless tobacco: Never Used  Vaping Use  . Vaping Use: Former  . Substances: Flavoring  Substance and Sexual Activity  . Alcohol use: No    Alcohol/week: 0.0 standard drinks  . Drug use: No  . Sexual activity: Yes    Birth control/protection: Surgical  Other Topics  Concern  . Not on file  Social History Narrative  . Not on file   Social Determinants of Health   Financial Resource Strain: Not on file  Food Insecurity: Not on file  Transportation Needs: Not on file  Physical Activity: Not on file  Stress: Not on file  Social Connections: Not on file  Intimate Partner Violence: Not on file      ROS:  General: Negative for anorexia, weight loss, fever, chills, fatigue, weakness. Eyes: Negative for vision changes.  ENT: Negative for   difficulty swallowing , nasal congestion.  Chronic hoarseness CV: Negative for chest pain, angina, palpitations, dyspnea on exertion, peripheral edema.  Respiratory: Negative for dyspnea at rest, dyspnea on exertion, cough, sputum, wheezing.  GI: See history of present illness. GU:  Negative for dysuria, hematuria, urinary incontinence, urinary frequency, nocturnal urination.  MS: Negative for joint pain, low back pain.  Derm: Negative for itching.  Planes of rash Neuro: Negative for weakness, abnormal sensation, seizure, frequent headaches, memory loss, confusion.  Psych: Negative for anxiety, depression, suicidal ideation, hallucinations.  Endo: Negative for unusual weight change.  Heme: Negative for bruising or bleeding. Allergy: Negative for rash or hives.     Physical Examination:  BP 132/61   Pulse 76   Temp (!) 96.7 F (35.9 C) (Temporal)   Ht 5\' 2"  (1.575 m)   Wt 150 lb 6.4 oz (68.2 kg)   BMI 27.51 kg/m    General: Well-nourished, well-developed in no acute distress.  Head: Normocephalic, atraumatic.   Eyes: Conjunctiva pink, no icterus. Mouth:masked Neck: Supple without thyromegaly, masses, or lymphadenopathy.  Lungs: Clear to auscultation bilaterally.  Heart: Regular rate and rhythm, no murmurs rubs or gallops.  Abdomen: Bowel sounds are normal, nontender, nondistended, no hepatosplenomegaly or masses, no abdominal bruits or    hernia , no rebound or guarding.   Rash noted right upper quadrant extending around left flank and into the back consistent with shingles Rectal: not performed Extremities: No lower extremity edema. No clubbing or deformities.  Neuro: Alert and oriented x 4 , grossly normal neurologically.  Skin: Warm and dry, no rash or jaundice.   Psych: Alert and cooperative, normal mood and affect.  Imaging Studies: No results found.   Assessment/Plan:  70 year old female presenting back in short interval follow-up for rectal bleeding.  Complaining of change in bowel habits, hemorrhoids.  Patient last seen in March.  Change in bowel habits/rectal bleeding: Over the course of several months she has noted change in bowel habits.  Used to have regular BMs every morning, now skips days without a BM and then passes only very small thin stool.  Feels like she is not emptying her bowels adequately.  Previously failed MiraLAX.  Amitiza 24 mcg previously caused diarrhea even taking once daily.  Insurance would not pay for Amitiza 8 mcg.  We switched to Linzess 72 mcg daily, which she has been on it for a week and has passed nothing but watery stools.  She has not got relief and wonders if she is having spurious diarrhea.  She also took additional magnesium citrate without relief.  She complains of prolapsed hemorrhoids in the  setting of significant stooling.  She started seeing fresh blood per rectum over the weekend, last episode yesterday.  Described of fresh blood with possible tiny clots.  Suspect hemorrhoid bleeding.  I have low suspicion for significant obstipation or blockage.  Will obtain x-ray to be sure.  Previous CT with fatty appearing ileocecal valve  and in the setting of lower GI symptoms had been offered colonoscopy in the past.  At this time she is willing to pursue colonoscopy given change in bowel habits and recent bleeding.   GERD: poorly controlled off PPI. History of chronic hoarseness, evaluated by ENT several occasions and diagnosed with Reinke'sedema which is often noted in chronic smokers but acid reflux can play a role as well. H2 blocker without adequate relief. She did not complete bone density study, ordered by PCP due to concern for chronic PPI use.  Previously suggested she resume PPI as needed.   RUQ pain likely due to abdominal wall hernia. Has seen surgery with plans for surgery if she is able to quit smoking. Patient does not feel she will be successful. She feels like pain is less on gabapentin and when BMs are adequate. She may try supportive measures for now.   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 Patient physically declined but would like to pursue at this point.    1. DG abdomen 2 view, will decide bowel regimen based on findings. 2. CBC 3. ED precautions discussed. 4. Colonoscopy with Dr. Abbey Chatters ASAP. ASA III.  I have discussed the risks, alternatives, benefits with regards to but not limited to the risk of reaction to medication, bleeding, infection, perforation and the patient is agreeable to proceed. Written consent to be obtained.

## 2020-07-28 NOTE — Progress Notes (Signed)
CC'ED TO PCP 

## 2020-07-29 ENCOUNTER — Telehealth: Payer: Self-pay | Admitting: *Deleted

## 2020-07-29 NOTE — Telephone Encounter (Signed)
Called pt and is aware of pre-op/covid test appt details. She voiced understanding.

## 2020-07-29 NOTE — Telephone Encounter (Signed)
Called pt. Offered sooner appt for procedure. Agree'd. Pt moved to 3/31 am appt. Aware will fax prep instructions to pharmacy. Advised will call back with pre-op/covid test appt.

## 2020-07-31 ENCOUNTER — Ambulatory Visit
Admission: EM | Admit: 2020-07-31 | Discharge: 2020-07-31 | Disposition: A | Discharge status: Home / Self Care | Payer: PPO | Attending: Internal Medicine | Admitting: Internal Medicine

## 2020-07-31 ENCOUNTER — Encounter: Payer: Self-pay | Admitting: Emergency Medicine

## 2020-07-31 ENCOUNTER — Other Ambulatory Visit: Payer: Self-pay

## 2020-07-31 DIAGNOSIS — B356 Tinea cruris: Secondary | ICD-10-CM | POA: Diagnosis not present

## 2020-07-31 DIAGNOSIS — B029 Zoster without complications: Secondary | ICD-10-CM | POA: Diagnosis not present

## 2020-07-31 MED ORDER — LIDOCAINE 5 % EX OINT: 1.0000 "application " | TOPICAL_OINTMENT | CUTANEOUS | 0 refills | Status: DC | PRN

## 2020-07-31 MED ORDER — CLOTRIMAZOLE 1 % EX CREA: TOPICAL_CREAM | CUTANEOUS | 0 refills | Status: DC

## 2020-07-31 NOTE — ED Triage Notes (Signed)
Dx with shingles this past weekend.  Pain on left side and now groin area.

## 2020-07-31 NOTE — ED Provider Notes (Signed)
[facility]    CSN: [id number] Arrival date & time: 07/31/20  1601      History   Chief Complaint No chief complaint on file.   HPI Lori Lopez is a 70 y.o. female recently diagnosed with shingles involving the right side of the abdomen comes to urgent care complaining of worsening symptoms.  She complains that she has had increased pain in the left shoulder and also pruritic rash in the left groin region.  The area affected by the shingles itself is not worsening.  No shortness of breath.  No fever or chills.  Patient was treated with Valtrex, gabapentin and a course of prednisone.   HPI  Past Medical History:  Diagnosis Date  . Adjustment disorder with depressed mood 09/08/2009  . Allergic rhinitis due to pollen 09/08/2009  . Anxiety   . Cigarette nicotine dependence, uncomplicated 07/14/6576  . COPD (chronic obstructive pulmonary disease) ([place]) 11/05/2010  . Degeneration of thoracolumbar intervertebral disc 06/13/2013  . 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 Hypothyroidism   . Mixed hyperlipidemia 05/15/2013  . Obesity due to excess calories 05/15/2013  . Tinea unguium 05/15/2013  . Vitamin D deficiency 12/28/2009    Patient Active Problem List   Diagnosis Date Noted  . Abnormal CT scan, colon 07/28/2020  . RUQ pain 07/08/2020  . Abdominal hernia 07/08/2020  . RUQ abdominal mass 03/17/2020  . Abdominal pain 03/17/2020  . Acute respiratory failure with hypoxia ([place]) 06/02/2018  . Influenza A 06/02/2018  . COPD with acute exacerbation ([place]) 06/02/2018  . Hypothyroidism 06/02/2018  . Sinus tachycardia 06/02/2018  . GAD (generalized anxiety disorder) 06/02/2018  . Elevated blood pressure reading 06/02/2018  . Dysphagia 10/19/2017  . GERD (gastroesophageal reflux disease) 04/12/2017  . Constipation 03/10/2016  . Rectal bleeding 03/10/2016  . SUI (stress urinary incontinence, female) 10/13/2014  . COPD (chronic obstructive pulmonary  disease) ([place]) 11/05/2010  . Cigarette nicotine dependence, uncomplicated 46/96/2952    Past Surgical History:  Procedure Laterality Date  . BACK SURGERY    . BIOPSY  11/07/2017   Procedure: BIOPSY;  Surgeon: Danie Binder, MD;  Location: [facility];  Service: Endoscopy;;  gastric biopsy   . CERVICAL DISC SURGERY    . COLONOSCOPY  2008   Dr. Oneida Alar: normal colon, small internal hemorrhoids.   . COLONOSCOPY N/A 04/08/2016   Dr. Oneida Alar: examined portion of ileum normal. two 5-6 mm polyps in descending colon and transverse colon, three 2-4 mm polyps in rectum, (all hyperplastic), internal and external hemorrhoids. Repeat in 10 years  . ESOPHAGOGASTRODUODENOSCOPY (EGD) WITH PROPOFOL N/A 11/07/2017   Dr. Oneida Alar: Normal esophagus status post dilation.  Mild gastritis with benign biopsies  . SAVORY DILATION N/A 11/07/2017   Procedure: SAVORY DILATION;  Surgeon: Danie Binder, MD;  Location: [facility];  Service: Endoscopy;  Laterality: N/A;    OB History    Gravida  3   Para  3   Term  3   Preterm      AB      Living  2     SAB      IAB      Ectopic      Multiple      Live Births               Home Medications    Prior to Admission medications   Medication Sig Start Date End Date Taking? Authorizing Provider  clotrimazole (LOTRIMIN) 1 % cream Apply to  affected area 2 times daily 07/31/20  Yes Diannie Willner, Myrene Galas, MD  lidocaine (XYLOCAINE) 5 % ointment Apply 1 application topically as needed. 07/31/20  Yes Daylyn Azbill, Myrene Galas, MD  ALPRAZolam Duanne Moron) 1 MG tablet Take 0.5-1 mg by mouth daily as needed for anxiety. 02/10/16   [provider]  aspirin EC 81 MG tablet Take 81 mg by mouth daily. Swallow whole.    [provider]  Cholecalciferol (VITAMIN D3) 1000 units CAPS Take 1,000 Units by mouth daily.    [provider]  gabapentin (NEURONTIN) 300 MG capsule Take daily midday with lunch for shingles pain for 14 days Patient taking differently:  Take 300 mg by mouth 3 (three) times daily. 07/26/20   Scot Jun, FNP  pantoprazole (PROTONIX) 40 MG tablet Take 40 mg by mouth 2 (two) times daily.    [provider]  polyethylene glycol-electrolytes (NULYTELY) 420 g solution As directed 07/28/20   Eloise Harman, DO  predniSONE (DELTASONE) 20 MG tablet Take 2 tablets (40 mg total) by mouth daily with breakfast for 5 days. 07/26/20 07/31/20  Scot Jun, FNP  valACYclovir (VALTREX) 1000 MG tablet Take 1 tablet (1,000 mg total) by mouth 2 (two) times daily for 14 days. 07/26/20 08/09/20  Scot Jun, FNP    Family History Family History  Problem Relation Age of Onset  . Diabetes Mother   . CAD Mother   . Heart disease Mother   . CAD Father   . Colon cancer Neg Hx   . Colon polyps Neg Hx     Social History Social History   Tobacco Use  . Smoking status: Current Some Day Smoker    Packs/day: 0.50    Years: 45.00    Pack years: 22.50    Types: Cigarettes    Last attempt to quit: 05/26/2018    Years since quitting: 2.1  . Smokeless tobacco: Never Used  Vaping Use  . Vaping Use: Former  . Substances: Flavoring  Substance Use Topics  . Alcohol use: No    Alcohol/week: 0.0 standard drinks  . Drug use: No     Allergies   Shrimp [shellfish allergy], Hydrocodone, and Lyrica [pregabalin]   Review of Systems Review of Systems  Respiratory: Negative.   Musculoskeletal: Positive for myalgias.  Skin: Positive for rash. Negative for color change and wound.  Neurological: Negative.      Physical Exam Triage Vital Signs ED Triage Vitals  Enc Vitals Group     BP 07/31/20 1629 135/66     Pulse Rate 07/31/20 1629 76     Resp 07/31/20 1629 20     Temp 07/31/20 1629 97.9 F (36.6 C)     Temp Source 07/31/20 1629 Oral     SpO2 07/31/20 1629 92 %     Weight --      Height --      Head Circumference --      Peak Flow --      Pain Score 07/31/20 1630 10     Pain Loc --      Pain Edu? --       Excl. in [place]? --    No data found.  Updated Vital Signs BP 135/66 (BP Location: Right Arm)   Pulse 76   Temp 97.9 F (36.6 C) (Oral)   Resp 20   SpO2 92%   Visual Acuity Right Eye Distance:   Left Eye Distance:   Bilateral Distance:    Right Eye  Near:   Left Eye Near:    Bilateral Near:     Physical Exam Vitals and nursing note reviewed.  Constitutional:      Appearance: She is not ill-appearing.  Cardiovascular:     Rate and Rhythm: Normal rate and regular rhythm.  Pulmonary:     Effort: Pulmonary effort is normal.     Breath sounds: Normal breath sounds.  Musculoskeletal:        General: Normal range of motion.  Skin:    Comments: Vesicular rash on the left thoracic area.  Minimal surrounding erythema.  No signs of cellulitis. Left groin rash consistent with tinea cruris.  Neurological:     Mental Status: She is alert.      UC Treatments / Results  Labs (all labs ordered are listed, but only abnormal results are displayed) Labs Reviewed - No data to display  EKG   Radiology No results found.  Procedures Procedures (including critical care time)  Medications Ordered in UC Medications - No data to display  Initial Impression / Assessment and Plan / UC Course  I have reviewed the triage vital signs and the nursing notes.  Pertinent labs & imaging results that were available during my care of the patient were reviewed by me and considered in my medical decision making (see chart for details).     1.  Shingles: Continue current medication regimen Lidoderm cream as needed for neuropathic pain.  2.  Tinea cruris Lotrimin cream If symptoms worsen please return to urgent care to be reevaluated.   Final Clinical Impressions(s) / UC Diagnoses   Final diagnoses:  Tinea cruris  Herpes zoster without complication     Discharge Instructions     Apply the lidocaine ointment to the areas affected by shingles Apply the Lotrimin cream to the left groin  area   ED Prescriptions    Medication Sig Dispense Auth. Provider   lidocaine (XYLOCAINE) 5 % ointment Apply 1 application topically as needed. 35.44 g Stephen Baruch, Myrene Galas, MD   clotrimazole (LOTRIMIN) 1 % cream Apply to affected area 2 times daily 15 g Jerrica Thorman, Myrene Galas, MD     PDMP not reviewed this encounter.   Chase Picket, MD 07/31/20 [phone]

## 2020-07-31 NOTE — Discharge Instructions (Signed)
Apply the lidocaine ointment to the areas affected by shingles Apply the Lotrimin cream to the left groin area

## 2020-08-03 NOTE — Patient Instructions (Signed)
Lori Lopez  08/03/2020     @PREFPERIOPPHARMACY @   Your procedure is scheduled on 08/06/2020.  Report to [facility] at 6:30 A.M.  Call this number if you have problems the morning of surgery:  [phone]   Remember:  Do not eat or drink after midnight.  Follow all instructions given to you by Dr. Ave Filter office    Take these medicines the morning of surgery with A SIP OF WATER Xanax, Gabapentin, Protonix    Do not wear jewelry, make-up or nail polish.  Do not wear lotions, powders, or perfumes, or deodorant.  Do not shave 48 hours prior to surgery.  Men may shave face and neck.  Do not bring valuables to the hospital.  [facility] is not responsible for any belongings or valuables.  Contacts, dentures or bridgework may not be worn into surgery.  Leave your suitcase in the car.  After surgery it may be brought to your room.  For patients admitted to the hospital, discharge time will be determined by your treatment team.  Patients discharged the day of surgery will not be allowed to drive home.   Please read over the following fact sheets that you were given. Post Anesthesia Instrucations      PATIENT INSTRUCTIONS POST-ANESTHESIA  IMMEDIATELY FOLLOWING SURGERY:  Do not drive or operate machinery for the first twenty four hours after surgery.  Do not make any important decisions for twenty four hours after surgery or while taking narcotic pain medications or sedatives.  If you develop intractable nausea and vomiting or a severe headache please notify your doctor immediately.  FOLLOW-UP:  Please make an appointment with your surgeon as instructed. You do not need to follow up with anesthesia unless specifically instructed to do so.  WOUND CARE INSTRUCTIONS (if applicable):  Keep a dry clean dressing on the anesthesia/puncture wound site if there is drainage.  Once the wound has quit draining you may leave it open to air.  Generally you should leave the bandage intact  for twenty four hours unless there is drainage.  If the epidural site drains for more than 36-48 hours please call the anesthesia department.  QUESTIONS?:  Please feel free to call your physician or the hospital operator if you have any questions, and they will be happy to assist you.      Colonoscopy, Adult, Care After This sheet gives you information about how to care for yourself after your procedure. Your health care provider may also give you more specific instructions. If you have problems or questions, contact your health care provider. What can I expect after the procedure? After the procedure, it is common to have:  A small amount of blood in your stool for 24 hours after the procedure.  Some gas.  Mild cramping or bloating of your abdomen. Follow these instructions at home: Eating and drinking  Drink enough fluid to keep your urine pale yellow.  Follow instructions from your health care provider about eating or drinking restrictions.  Resume your normal diet as instructed by your health care provider. Avoid heavy or fried foods that are hard to digest.   Activity  Rest as told by your health care provider.  Avoid sitting for a long time without moving. Get up to take short walks every 1-2 hours. This is important to improve blood flow and breathing. Ask for help if you feel weak or unsteady.  Return to your normal activities as told by your health care provider. Ask  your health care provider what activities are safe for you. Managing cramping and bloating  Try walking around when you have cramps or feel bloated.  Apply heat to your abdomen as told by your health care provider. Use the heat source that your health care provider recommends, such as a moist heat pack or a heating pad. ? Place a towel between your skin and the heat source. ? Leave the heat on for 20-30 minutes. ? Remove the heat if your skin turns bright red. This is especially important if you are unable  to feel pain, heat, or cold. You may have a greater risk of getting burned.   General instructions  If you were given a sedative during the procedure, it can affect you for several hours. Do not drive or operate machinery until your health care provider says that it is safe.  For the first 24 hours after the procedure: ? Do not sign important documents. ? Do not drink alcohol. ? Do your regular daily activities at a slower pace than normal. ? Eat soft foods that are easy to digest.  Take over-the-counter and prescription medicines only as told by your health care provider.  Keep all follow-up visits as told by your health care provider. This is important. Contact a health care provider if:  You have blood in your stool 2-3 days after the procedure. Get help right away if you have:  More than a small spotting of blood in your stool.  Large blood clots in your stool.  Swelling of your abdomen.  Nausea or vomiting.  A fever.  Increasing pain in your abdomen that is not relieved with medicine. Summary  After the procedure, it is common to have a small amount of blood in your stool. You may also have mild cramping and bloating of your abdomen.  If you were given a sedative during the procedure, it can affect you for several hours. Do not drive or operate machinery until your health care provider says that it is safe.  Get help right away if you have a lot of blood in your stool, nausea or vomiting, a fever, or increased pain in your abdomen. This information is not intended to replace advice given to you by your health care provider. Make sure you discuss any questions you have with your health care provider. Document Revised: 04/19/2019 Document Reviewed: 11/19/2018 Elsevier Patient Education  [place].

## 2020-08-04 ENCOUNTER — Encounter (HOSPITAL_COMMUNITY)
Admission: RE | Admit: 2020-08-04 | Discharge: 2020-08-04 | Disposition: A | Discharge status: Home / Self Care | Payer: PPO | Source: Ambulatory Visit | Attending: Internal Medicine | Admitting: Internal Medicine

## 2020-08-04 ENCOUNTER — Other Ambulatory Visit: Payer: Self-pay

## 2020-08-04 ENCOUNTER — Telehealth: Payer: Self-pay | Admitting: *Deleted

## 2020-08-04 ENCOUNTER — Other Ambulatory Visit (HOSPITAL_COMMUNITY)
Admission: RE | Admit: 2020-08-04 | Discharge: 2020-08-04 | Disposition: A | Discharge status: Home / Self Care | Payer: PPO | Source: Ambulatory Visit | Attending: Internal Medicine | Admitting: Internal Medicine

## 2020-08-04 NOTE — Telephone Encounter (Signed)
Pt called back. She is going to call endo to see if she can r/s her pre-op/covid

## 2020-08-04 NOTE — Telephone Encounter (Signed)
Called pt, LMOVM 

## 2020-08-04 NOTE — Telephone Encounter (Signed)
-----   Message from Encarnacion Chu, RN sent at 08/04/2020  3:27 PM EDT ----- Regarding: shingles Hey guys! I was just checking to see if Dr Abbey Chatters is okay with going ahead with Lori Lopez procedure. She has been in ED twice for shingles. The las visit was 3/25 and they put her on prednisone and valtrex. She is here for her preop and states she is still having a lot of pain and she is still broken out. I just wanted to make sure before she drinks her prep.

## 2020-08-04 NOTE — Telephone Encounter (Signed)
Lori Chu, RN  Izell Labat, Wayne Both, CMA Hey [name]! I have been talking to Dr Abbey Chatters via text. [name] does not feel she wants to proceed now due to the pain. She will call you guys to reschedule when she feels better. Dr Abbey Chatters and Hoyle Sauer are both aware of her decision.

## 2020-08-04 NOTE — Telephone Encounter (Signed)
-----   Message from Encarnacion Chu, RN sent at 08/04/2020 10:03 AM EDT ----- Regarding: no show Good morning! Lori Lopez did not show for her PAT and COVID this morning. She was in the ED on 07/31/2020 and was diagnosed with shingles. Not sure if this has any bearing on her not showing or not!

## 2020-08-04 NOTE — Telephone Encounter (Signed)
Please advise below Dr. Abbey Chatters thanks

## 2020-08-06 ENCOUNTER — Ambulatory Visit (HOSPITAL_COMMUNITY): Admission: RE | Admit: 2020-08-06 | Payer: PPO | Source: Home / Self Care

## 2020-08-06 ENCOUNTER — Encounter (HOSPITAL_COMMUNITY): Admission: RE | Payer: Self-pay | Source: Home / Self Care

## 2020-08-06 SURGERY — COLONOSCOPY WITH PROPOFOL
Anesthesia: Monitor Anesthesia Care

## 2020-08-10 ENCOUNTER — Inpatient Hospital Stay (HOSPITAL_COMMUNITY): Admission: RE | Admit: 2020-08-10 | Payer: PPO | Source: Ambulatory Visit

## 2020-08-14 NOTE — Patient Instructions (Signed)
DUE TO COVID-19 ONLY ONE VISITOR IS ALLOWED TO COME WITH YOU AND STAY IN THE WAITING ROOM ONLY DURING PRE OP AND PROCEDURE DAY OF SURGERY. THE 1 VISITOR  MAY VISIT WITH YOU AFTER SURGERY IN YOUR PRIVATE ROOM DURING VISITING HOURS ONLY!  YOU NEED TO HAVE A COVID 19 TEST ON___4/19____ @_______ , THIS TEST MUST BE DONE BEFORE SURGERY,  COVID TESTING SITE [address] [place] [address], IT IS ON THE RIGHT GOING OUT [address] APPROXIMATELY  2 MINUTES PAST [employer] ON THE RIGHT. ONCE YOUR COVID TEST IS COMPLETED,  PLEASE BEGIN THE QUARANTINE INSTRUCTIONS AS OUTLINED IN YOUR HANDOUT.                Gean Maidens    Your procedure is scheduled on: 08/27/20   Report to [facility] Main  Entrance   Report to short stay at 5:15 am     Call this number if you have problems the morning of surgery [phone]    Remember: Do not eat food or drink liquids :After Midnight.   BRUSH YOUR TEETH MORNING OF SURGERY AND RINSE YOUR MOUTH OUT, NO CHEWING GUM CANDY OR MINTS.     Take these medicines the morning of surgery with A SIP OF WATER: Patoprazole, Xanax if needed                                 You may not have any metal on your body including hair pins and              piercings  Do not wear jewelry, make-up, lotions, powders or perfumes, deodorant             Do not wear nail polish on your fingernails.  Do not shave  48 hours prior to surgery.     Do not bring valuables to the hospital. [place].  Contacts, dentures or bridgework may not be worn into surgery.      Patients discharged the day of surgery will not be allowed to drive home.   IF YOU ARE HAVING SURGERY AND GOING HOME THE SAME DAY, YOU MUST HAVE AN ADULT TO DRIVE YOU HOME AND BE WITH YOU FOR 24 HOURS.  YOU MAY GO HOME BY TAXI OR UBER OR ORTHERWISE, BUT AN ADULT MUST ACCOMPANY YOU HOME AND STAY WITH YOU FOR 24 HOURS.  Name and phone number  of your driver:  Special Instructions: N/A              Please read over the following fact sheets you were given: _____________________________________________________________________             [facility] - Preparing for Surgery Before surgery, you can play an important role.  Because skin is not sterile, your skin needs to be as free of germs as possible.  You can reduce the number of germs on your skin by washing with CHG (chlorahexidine gluconate) soap before surgery.  CHG is an antiseptic cleaner which kills germs and bonds with the skin to continue killing germs even after washing. Please DO NOT use if you have an allergy to CHG or antibacterial soaps.  If your skin becomes reddened/irritated stop using the CHG and inform your nurse when you arrive at Short Stay. Do not shave (including legs  and underarms) for at least 48 hours prior to the first CHG shower.   Please follow these instructions carefully:  1.  Shower with CHG Soap the night before surgery and the  morning of Surgery.  2.  If you choose to wash your hair, wash your hair first as usual with your  normal  shampoo.  3.  After you shampoo, rinse your hair and body thoroughly to remove the  shampoo.                                        4.  Use CHG as you would any other liquid soap.  You can apply chg directly  to the skin and wash                       Gently with a scrungie or clean washcloth.  5.  Apply the CHG Soap to your body ONLY FROM THE NECK DOWN.   Do not use on face/ open                           Wound or open sores. Avoid contact with eyes, ears mouth and genitals (private parts).                       Wash face,  Genitals (private parts) with your normal soap.             6.  Wash thoroughly, paying special attention to the area where your surgery  will be performed.  7.  Thoroughly rinse your body with warm water from the neck down.  8.  DO NOT shower/wash with your normal soap after using and rinsing off   the CHG Soap.             9.  Pat yourself dry with a clean towel.            10.  Wear clean pajamas.            11.  Place clean sheets on your bed the night of your first shower and do not  sleep with pets. Day of Surgery : Do not apply any lotions/deodorants the morning of surgery.  Please wear clean clothes to the hospital/surgery center.  FAILURE TO FOLLOW THESE INSTRUCTIONS MAY RESULT IN THE CANCELLATION OF YOUR SURGERY PATIENT SIGNATURE_________________________________  NURSE SIGNATURE__________________________________  ________________________________________________________________________

## 2020-08-17 ENCOUNTER — Encounter (HOSPITAL_COMMUNITY)
Admission: RE | Admit: 2020-08-17 | Discharge: 2020-08-17 | Disposition: A | Discharge status: Home / Self Care | Payer: PPO | Source: Ambulatory Visit | Attending: Internal Medicine | Admitting: Internal Medicine

## 2020-08-18 DIAGNOSIS — B0222 Postherpetic trigeminal neuralgia: Secondary | ICD-10-CM | POA: Diagnosis not present

## 2020-08-19 ENCOUNTER — Ambulatory Visit (HOSPITAL_COMMUNITY)
Admission: RE | Admit: 2020-08-19 | Discharge: 2020-08-19 | Disposition: A | Discharge status: Home / Self Care | Payer: PPO | Source: Ambulatory Visit | Attending: Internal Medicine | Admitting: Internal Medicine

## 2020-08-19 ENCOUNTER — Other Ambulatory Visit: Payer: Self-pay

## 2020-08-19 DIAGNOSIS — M85852 Other specified disorders of bone density and structure, left thigh: Secondary | ICD-10-CM | POA: Diagnosis not present

## 2020-08-19 DIAGNOSIS — Z13820 Encounter for screening for osteoporosis: Secondary | ICD-10-CM | POA: Insufficient documentation

## 2020-08-19 DIAGNOSIS — Z780 Asymptomatic menopausal state: Secondary | ICD-10-CM | POA: Insufficient documentation

## 2020-08-24 ENCOUNTER — Other Ambulatory Visit (HOSPITAL_COMMUNITY): Payer: PPO

## 2020-08-27 ENCOUNTER — Ambulatory Visit: Admit: 2020-08-27 | Payer: PPO | Admitting: Surgery

## 2020-08-27 SURGERY — REPAIR, HERNIA, INCISIONAL, LAPAROSCOPIC
Anesthesia: General

## 2020-08-29 DIAGNOSIS — B0223 Postherpetic polyneuropathy: Secondary | ICD-10-CM | POA: Diagnosis not present

## 2020-08-31 ENCOUNTER — Telehealth: Payer: Self-pay | Admitting: Internal Medicine

## 2020-08-31 NOTE — Telephone Encounter (Signed)
Pre-op/covid test 09/18/20. Appt letter mailed with procedure instructions. 

## 2020-08-31 NOTE — Telephone Encounter (Signed)
Called pt, TCS rescheduled to 09/22/20 at 2:45pm. Orders entered.. She already has prep.

## 2020-08-31 NOTE — Telephone Encounter (Signed)
Patient wants to reschedule her procedure [phone]

## 2020-09-15 NOTE — Patient Instructions (Signed)
Lori Lopez  09/15/2020     @PREFPERIOPPHARMACY @   Your procedure is scheduled on  09/22/2020.   Report to [facility] at  1245  P.M.   Call this number if you have problems the morning of surgery:  [phone]   Remember:  Follow the diet and prep instructions given to you bu the office.                    Take these medicines the morning of surgery with A SIP OF WATER  Xanax (if needed), gabapentin, claritin, pantoprazole.     Please brush your teeth.  Do not wear jewelry, make-up or nail polish.  Do not wear lotions, powders, or perfumes, or deodorant.  Do not shave 48 hours prior to surgery.  Men may shave face and neck.  Do not bring valuables to the hospital.  [facility] is not responsible for any belongings or valuables.  Contacts, dentures or bridgework may not be worn into surgery.  Leave your suitcase in the car.  After surgery it may be brought to your room.  For patients admitted to the hospital, discharge time will be determined by your treatment team.  Patients discharged the day of surgery will not be allowed to drive home and must have someone with them for 24 hours.   Special instructions:  DO NOT smoke tobacco or vape for 24 hours before your procedure.  Please read over the following fact sheets that you were given. Anesthesia Post-op Instructions and Care and Recovery After Surgery       Colonoscopy, Adult, Care After This sheet gives you information about how to care for yourself after your procedure. Your health care provider may also give you more specific instructions. If you have problems or questions, contact your health care provider. What can I expect after the procedure? After the procedure, it is common to have:  A small amount of blood in your stool for 24 hours after the procedure.  Some gas.  Mild cramping or bloating of your abdomen. Follow these instructions at home: Eating and drinking  Drink enough fluid to  keep your urine pale yellow.  Follow instructions from your health care provider about eating or drinking restrictions.  Resume your normal diet as instructed by your health care provider. Avoid heavy or fried foods that are hard to digest.   Activity  Rest as told by your health care provider.  Avoid sitting for a long time without moving. Get up to take short walks every 1-2 hours. This is important to improve blood flow and breathing. Ask for help if you feel weak or unsteady.  Return to your normal activities as told by your health care provider. Ask your health care provider what activities are safe for you. Managing cramping and bloating  Try walking around when you have cramps or feel bloated.  Apply heat to your abdomen as told by your health care provider. Use the heat source that your health care provider recommends, such as a moist heat pack or a heating pad. ? Place a towel between your skin and the heat source. ? Leave the heat on for 20-30 minutes. ? Remove the heat if your skin turns bright red. This is especially important if you are unable to feel pain, heat, or cold. You may have a greater risk of getting burned.   General instructions  If you were given a sedative during the procedure, it  can affect you for several hours. Do not drive or operate machinery until your health care provider says that it is safe.  For the first 24 hours after the procedure: ? Do not sign important documents. ? Do not drink alcohol. ? Do your regular daily activities at a slower pace than normal. ? Eat soft foods that are easy to digest.  Take over-the-counter and prescription medicines only as told by your health care provider.  Keep all follow-up visits as told by your health care provider. This is important. Contact a health care provider if:  You have blood in your stool 2-3 days after the procedure. Get help right away if you have:  More than a small spotting of blood in your  stool.  Large blood clots in your stool.  Swelling of your abdomen.  Nausea or vomiting.  A fever.  Increasing pain in your abdomen that is not relieved with medicine. Summary  After the procedure, it is common to have a small amount of blood in your stool. You may also have mild cramping and bloating of your abdomen.  If you were given a sedative during the procedure, it can affect you for several hours. Do not drive or operate machinery until your health care provider says that it is safe.  Get help right away if you have a lot of blood in your stool, nausea or vomiting, a fever, or increased pain in your abdomen. This information is not intended to replace advice given to you by your health care provider. Make sure you discuss any questions you have with your health care provider. Document Revised: 04/19/2019 Document Reviewed: 11/19/2018 Elsevier Patient Education  2021 [place] After This sheet gives you information about how to care for yourself after your procedure. Your health care provider may also give you more specific instructions. If you have problems or questions, contact your health care provider. What can I expect after the procedure? After the procedure, it is common to have:  Tiredness.  Forgetfulness about what happened after the procedure.  Impaired judgment for important decisions.  Nausea or vomiting.  Some difficulty with balance. Follow these instructions at home: For the time period you were told by your health care provider:  Rest as needed.  Do not participate in activities where you could fall or become injured.  Do not drive or use machinery.  Do not drink alcohol.  Do not take sleeping pills or medicines that cause drowsiness.  Do not make important decisions or sign legal documents.  Do not take care of children on your own.      Eating and drinking  Follow the diet that is recommended by your  health care provider.  Drink enough fluid to keep your urine pale yellow.  If you vomit: ? Drink water, juice, or soup when you can drink without vomiting. ? Make sure you have little or no nausea before eating solid foods. General instructions  Have a responsible adult stay with you for the time you are told. It is important to have someone help care for you until you are awake and alert.  Take over-the-counter and prescription medicines only as told by your health care provider.  If you have sleep apnea, surgery and certain medicines can increase your risk for breathing problems. Follow instructions from your health care provider about wearing your sleep device: ? Anytime you are sleeping, including during daytime naps. ? While taking prescription pain medicines, sleeping medicines,  or medicines that make you drowsy.  Avoid smoking.  Keep all follow-up visits as told by your health care provider. This is important. Contact a health care provider if:  You keep feeling nauseous or you keep vomiting.  You feel light-headed.  You are still sleepy or having trouble with balance after 24 hours.  You develop a rash.  You have a fever.  You have redness or swelling around the IV site. Get help right away if:  You have trouble breathing.  You have new-onset confusion at home. Summary  For several hours after your procedure, you may feel tired. You may also be forgetful and have poor judgment.  Have a responsible adult stay with you for the time you are told. It is important to have someone help care for you until you are awake and alert.  Rest as told. Do not drive or operate machinery. Do not drink alcohol or take sleeping pills.  Get help right away if you have trouble breathing, or if you suddenly become confused. This information is not intended to replace advice given to you by your health care provider. Make sure you discuss any questions you have with your health care  provider. Document Revised: 01/09/2020 Document Reviewed: 03/28/2019 Elsevier Patient Education  2021 [employer].

## 2020-09-16 DIAGNOSIS — J069 Acute upper respiratory infection, unspecified: Secondary | ICD-10-CM | POA: Diagnosis not present

## 2020-09-16 DIAGNOSIS — J302 Other seasonal allergic rhinitis: Secondary | ICD-10-CM | POA: Diagnosis not present

## 2020-09-16 DIAGNOSIS — J029 Acute pharyngitis, unspecified: Secondary | ICD-10-CM | POA: Diagnosis not present

## 2020-09-16 DIAGNOSIS — H9201 Otalgia, right ear: Secondary | ICD-10-CM | POA: Diagnosis not present

## 2020-09-16 DIAGNOSIS — H9202 Otalgia, left ear: Secondary | ICD-10-CM | POA: Diagnosis not present

## 2020-09-18 ENCOUNTER — Encounter (HOSPITAL_COMMUNITY)
Admission: RE | Admit: 2020-09-18 | Discharge: 2020-09-18 | Disposition: A | Discharge status: Home / Self Care | Payer: PPO | Source: Ambulatory Visit | Attending: Internal Medicine | Admitting: Internal Medicine

## 2020-09-18 ENCOUNTER — Other Ambulatory Visit (HOSPITAL_COMMUNITY)
Admission: RE | Admit: 2020-09-18 | Discharge: 2020-09-18 | Disposition: A | Discharge status: Home / Self Care | Payer: PPO | Source: Ambulatory Visit | Attending: Internal Medicine | Admitting: Internal Medicine

## 2020-09-18 ENCOUNTER — Encounter (HOSPITAL_COMMUNITY): Payer: Self-pay

## 2020-09-18 ENCOUNTER — Other Ambulatory Visit: Payer: Self-pay

## 2020-09-18 DIAGNOSIS — Z20822 Contact with and (suspected) exposure to covid-19: Secondary | ICD-10-CM | POA: Diagnosis not present

## 2020-09-18 DIAGNOSIS — Z01818 Encounter for other preprocedural examination: Secondary | ICD-10-CM | POA: Insufficient documentation

## 2020-09-19 LAB — SARS CORONAVIRUS 2 (TAT 6-24 HRS): SARS Coronavirus 2: NEGATIVE

## 2020-09-22 ENCOUNTER — Encounter (HOSPITAL_COMMUNITY)
Admission: RE | Disposition: A | Discharge status: Home / Self Care | Payer: Self-pay | Source: Home / Self Care | Attending: Internal Medicine

## 2020-09-22 ENCOUNTER — Ambulatory Visit (HOSPITAL_COMMUNITY): Payer: PPO | Admitting: Certified Registered"

## 2020-09-22 ENCOUNTER — Encounter (HOSPITAL_COMMUNITY): Payer: Self-pay

## 2020-09-22 ENCOUNTER — Other Ambulatory Visit: Payer: Self-pay

## 2020-09-22 ENCOUNTER — Ambulatory Visit (HOSPITAL_COMMUNITY)
Admission: RE | Admit: 2020-09-22 | Discharge: 2020-09-22 | Disposition: A | Discharge status: Home / Self Care | Payer: PPO | Attending: Internal Medicine | Admitting: Internal Medicine

## 2020-09-22 DIAGNOSIS — K648 Other hemorrhoids: Secondary | ICD-10-CM | POA: Insufficient documentation

## 2020-09-22 DIAGNOSIS — K5730 Diverticulosis of large intestine without perforation or abscess without bleeding: Secondary | ICD-10-CM | POA: Insufficient documentation

## 2020-09-22 DIAGNOSIS — Z7982 Long term (current) use of aspirin: Secondary | ICD-10-CM | POA: Insufficient documentation

## 2020-09-22 DIAGNOSIS — K625 Hemorrhage of anus and rectum: Secondary | ICD-10-CM | POA: Diagnosis not present

## 2020-09-22 DIAGNOSIS — K5520 Angiodysplasia of colon without hemorrhage: Secondary | ICD-10-CM | POA: Diagnosis not present

## 2020-09-22 DIAGNOSIS — D123 Benign neoplasm of transverse colon: Secondary | ICD-10-CM | POA: Insufficient documentation

## 2020-09-22 DIAGNOSIS — K5900 Constipation, unspecified: Secondary | ICD-10-CM | POA: Diagnosis not present

## 2020-09-22 DIAGNOSIS — J449 Chronic obstructive pulmonary disease, unspecified: Secondary | ICD-10-CM | POA: Diagnosis not present

## 2020-09-22 DIAGNOSIS — K635 Polyp of colon: Secondary | ICD-10-CM | POA: Diagnosis not present

## 2020-09-22 DIAGNOSIS — F17210 Nicotine dependence, cigarettes, uncomplicated: Secondary | ICD-10-CM | POA: Insufficient documentation

## 2020-09-22 DIAGNOSIS — Z8719 Personal history of other diseases of the digestive system: Secondary | ICD-10-CM | POA: Insufficient documentation

## 2020-09-22 DIAGNOSIS — Z79899 Other long term (current) drug therapy: Secondary | ICD-10-CM | POA: Diagnosis not present

## 2020-09-22 HISTORY — PX: COLONOSCOPY WITH PROPOFOL: SHX5780

## 2020-09-22 HISTORY — PX: POLYPECTOMY: SHX5525

## 2020-09-22 SURGERY — COLONOSCOPY WITH PROPOFOL
Anesthesia: General

## 2020-09-22 MED ORDER — LIDOCAINE HCL (PF) 2 % IJ SOLN
INTRAMUSCULAR | Status: AC
  Filled 2020-09-22: qty 5

## 2020-09-22 MED ORDER — LIDOCAINE HCL (CARDIAC) PF 100 MG/5ML IV SOSY
PREFILLED_SYRINGE | INTRAVENOUS | Status: DC | PRN
  Administered 2020-09-22: 100 mg via INTRAVENOUS

## 2020-09-22 MED ORDER — PROPOFOL 10 MG/ML IV BOLUS
INTRAVENOUS | Status: DC | PRN
  Administered 2020-09-22: 125 ug/kg/min via INTRAVENOUS
  Administered 2020-09-22: 100 mg via INTRAVENOUS

## 2020-09-22 MED ORDER — PROPOFOL 1000 MG/100ML IV EMUL
INTRAVENOUS | Status: AC
  Filled 2020-09-22: qty 100

## 2020-09-22 MED ORDER — LACTATED RINGERS IV SOLN
INTRAVENOUS | Status: DC
  Administered 2020-09-22: 1000 mL via INTRAVENOUS

## 2020-09-22 MED ORDER — STERILE WATER FOR IRRIGATION IR SOLN
Status: DC | PRN
  Administered 2020-09-22: 1.5 mL

## 2020-09-22 NOTE — H&P (Signed)
Primary Care Physician:  Celene Squibb, MD Primary Gastroenterologist:  Dr. Abbey Chatters  Pre-Procedure History & Physical: HPI:  Lori Lopez is a 70 y.o. female is here for a colonoscopy to be performed for rectal bleeding and constipation.   Past Medical History:  Diagnosis Date  . Adjustment disorder with depressed mood 09/08/2009  . Allergic rhinitis due to pollen 09/08/2009  . Anxiety   . Cigarette nicotine dependence, uncomplicated 2/59/5638  . COPD (chronic obstructive pulmonary disease) ([place]) 11/05/2010  . Degeneration of thoracolumbar intervertebral disc 06/13/2013  . 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 Hypothyroidism   . Mixed hyperlipidemia 05/15/2013  . Obesity due to excess calories 05/15/2013  . Tinea unguium 05/15/2013  . Vitamin D deficiency 12/28/2009    Past Surgical History:  Procedure Laterality Date  . BACK SURGERY    . BIOPSY  11/07/2017   Procedure: BIOPSY;  Surgeon: Danie Binder, MD;  Location: [facility];  Service: Endoscopy;;  gastric biopsy   . CERVICAL DISC SURGERY    . COLONOSCOPY  2008   Dr. Oneida Alar: normal colon, small internal hemorrhoids.   . COLONOSCOPY N/A 04/08/2016   Dr. Oneida Alar: examined portion of ileum normal. two 5-6 mm polyps in descending colon and transverse colon, three 2-4 mm polyps in rectum, (all hyperplastic), internal and external hemorrhoids. Repeat in 10 years  . ESOPHAGOGASTRODUODENOSCOPY (EGD) WITH PROPOFOL N/A 11/07/2017   Dr. Oneida Alar: Normal esophagus status post dilation.  Mild gastritis with benign biopsies  . SAVORY DILATION N/A 11/07/2017   Procedure: SAVORY DILATION;  Surgeon: Danie Binder, MD;  Location: [facility];  Service: Endoscopy;  Laterality: N/A;    Prior to Admission medications   Medication Sig Start Date End Date Taking? Authorizing Provider  aspirin EC 81 MG tablet Take 81 mg by mouth daily. Swallow whole.   Yes [provider]  Cholecalciferol (VITAMIN D3) 1000 units CAPS Take  1,000 Units by mouth daily.   Yes [provider]  gabapentin (NEURONTIN) 300 MG capsule Take daily midday with lunch for shingles pain for 14 days Patient taking differently: Take 300 mg by mouth 3 (three) times daily. 07/26/20  Yes Scot Jun, FNP  loratadine (CLARITIN) 10 MG tablet Take 10 mg by mouth daily.   Yes [provider]  pantoprazole (PROTONIX) 40 MG tablet Take 40 mg by mouth 2 (two) times daily.   Yes [provider]  ALPRAZolam Duanne Moron) 1 MG tablet Take 0.5 mg by mouth daily as needed for anxiety. 02/10/16   [provider]  clotrimazole (LOTRIMIN) 1 % cream Apply to affected area 2 times daily Patient not taking: No sig reported 07/31/20   Chase Picket, MD  lidocaine (XYLOCAINE) 5 % ointment Apply 1 application topically as needed. Patient not taking: No sig reported 07/31/20   Chase Picket, MD  polyethylene glycol-electrolytes (NULYTELY) 420 g solution As directed 07/28/20   Eloise Harman, DO  terbinafine (LAMISIL) 1 % cream Apply 1 application topically daily as needed (athlete's foot).    [provider]    Allergies as of 08/31/2020 - Review Complete 07/31/2020  Allergen Reaction Noted  . Shrimp [shellfish allergy] Anaphylaxis 10/10/2014  . Hydrocodone Hives and Itching 10/10/2014  . Lyrica [pregabalin] Other (See Comments) 10/10/2014    Family History  Problem Relation Age of Onset  . Diabetes Mother   . CAD Mother   . Heart disease Mother   . CAD Father   . Colon cancer  Neg Hx   . Colon polyps Neg Hx     Social History   Socioeconomic History  . Marital status: Married    Spouse name: Not on file  . Number of children: Not on file  . Years of education: Not on file  . Highest education level: Not on file  Occupational History  . Not on file  Tobacco Use  . Smoking status: Current Some Day Smoker    Packs/day: 0.50    Years: 45.00    Pack years: 22.50    Types: Cigarettes    Last attempt  to quit: 05/26/2018    Years since quitting: 2.3  . Smokeless tobacco: Never Used  Vaping Use  . Vaping Use: Former  . Substances: Flavoring  Substance and Sexual Activity  . Alcohol use: No    Alcohol/week: 0.0 standard drinks  . Drug use: No  . Sexual activity: Yes    Birth control/protection: Surgical  Other Topics Concern  . Not on file  Social History Narrative  . Not on file   Social Determinants of Health   Financial Resource Strain: Not on file  Food Insecurity: Not on file  Transportation Needs: Not on file  Physical Activity: Not on file  Stress: Not on file  Social Connections: Not on file  Intimate Partner Violence: Not on file    Review of Systems: See HPI, otherwise negative ROS  Physical Exam: Vital signs in last 24 hours: Temp:  [98.4 F (36.9 C)] 98.4 F (36.9 C) (05/17 1012) Pulse Rate:  [79] 79 (05/17 1012) Resp:  [16] 16 (05/17 1012) BP: (144)/(60) 144/60 (05/17 1012) SpO2:  [93 %] 93 % (05/17 1012) Weight:  [68 kg] 68 kg (05/17 1012)   General:   Alert,  Well-developed, well-nourished, pleasant and cooperative in NAD Head:  Normocephalic and atraumatic. Eyes:  Sclera clear, no icterus.   Conjunctiva pink. Ears:  Normal auditory acuity. Nose:  No deformity, discharge,  or lesions. Mouth:  No deformity or lesions, dentition normal. Neck:  Supple; no masses or thyromegaly. Lungs:  Clear throughout to auscultation.   No wheezes, crackles, or rhonchi. No acute distress. Heart:  Regular rate and rhythm; no murmurs, clicks, rubs,  or gallops. Abdomen:  Soft, nontender and nondistended. No masses, hepatosplenomegaly or hernias noted. Normal bowel sounds, without guarding, and without rebound.   Msk:  Symmetrical without gross deformities. Normal posture. Extremities:  Without clubbing or edema. Neurologic:  Alert and  oriented x4;  grossly normal neurologically. Skin:  Intact without significant lesions or rashes. Cervical Nodes:  No significant  cervical adenopathy. Psych:  Alert and cooperative. Normal mood and affect.  Impression/Plan: Lori Lopez is here for a colonoscopy to be performed for rectal bleeding and constipation.   The risks of the procedure including infection, bleed, or perforation as well as benefits, limitations, alternatives and imponderables have been reviewed with the patient. Questions have been answered. All parties agreeable.

## 2020-09-22 NOTE — Discharge Instructions (Addendum)
Gastrointestinal Arteriovenous Malformations  A gastrointestinal arteriovenous malformation is a rare defect of tangled veins and arteries in the intestine. This defect can occur anywhere in the intestine. The defect creates an abnormal collection of blood vessels that can put pressure on other parts of your body that are close by. It causes blood vessels to expand (dilate) over time and sometimes bleed. Many people with this defect never have any symptoms. This defect may be present at birth (congenital). What are the causes? The cause of this condition is not known. It may be related to genetic causes or other factors. What are the signs or symptoms? Signs and symptoms often do not appear until you are older and may include:  Blood in your stool.  Black or dark red stools.  Weakness.  Tiredness.  Shortness of breath.  Iron deficiency anemia. How is this diagnosed? This condition is diagnosed based on a physical exam and a medical history. Imaging tests may also be done, such as:  X-ray.  CT scan.  Angiogram. This is a procedure used to examine the blood vessels. In this procedure, contrast dye is injected through a thin tube (catheter) into an artery. X-rays are then taken, which show if there is a blockage or problem in a blood vessel.  Endoscopy. In this procedure, your health care provider passes a thin, flexible tube (endoscope) through your mouth and down your esophagus into your stomach. A small camera is attached to the end of the tube. Images from the camera appear on a monitor in the exam room.  Small bowel enteroscopy. This is a procedure that uses a long thin scope to visualize areas in the small bowel.  Capsule endoscopy. In this procedure, you will swallow a small capsule that has a tiny camera and transmitter in it. This capsule naturally travels through your digestive system. The camera takes photos of the small intestine along the way. How is this treated? The goal  of treatment is to stop blood loss and prevent any future bleeding. Treatment may include:  Procedures to stop the bleeding or to destroy the affected vessels.  Abdominal surgery to remove the affected section of the digestive system.  Blood transfusion to replace blood loss.  Iron supplements for iron deficiency anemia. Follow these instructions at home:  Take over-the-counter and prescription medicines only as told by your health care provider. This includes iron supplements, if you are told to use them.  Keep all follow-up visits as told by your health care provider. This is important. Contact a health care provider if:  You have streaks of blood in your stool for a long time.  Your stool is black or deep red.  You have signs of anemia such as weakness, fatigue, or shortness of breath.  You have new symptoms. Get help right away if:  You have heavy bleeding during a bowel movement. Summary  Gastrointestinal arteriovenous malformation refers to an abnormal collection of blood vessels that can put pressure on other parts of your body that are close by. It causes blood vessels to dilate over time and sometimes bleed.  Many people with this defect never have any symptoms.  The goal of treatment is to stop blood loss and prevent any future bleeding. This information is not intended to replace advice given to you by your health care provider. Make sure you discuss any questions you have with your health care provider. Document Revised: 08/15/2019 Document Reviewed: 08/15/2019 Elsevier Patient Education  2021 [employer]. Colonoscopy, Adult,  Care After This sheet gives you information about how to care for yourself after your procedure. Your health care provider may also give you more specific instructions. If you have problems or questions, contact your health care provider. What can I expect after the procedure? After the procedure, it is common to have:  A small amount of  blood in your stool for 24 hours after the procedure.  Some gas.  Mild cramping or bloating of your abdomen. Follow these instructions at home: Eating and drinking  Drink enough fluid to keep your urine pale yellow.  Follow instructions from your health care provider about eating or drinking restrictions.  Resume your normal diet as instructed by your health care provider. Avoid heavy or fried foods that are hard to digest.   Activity  Rest as told by your health care provider.  Avoid sitting for a long time without moving. Get up to take short walks every 1-2 hours. This is important to improve blood flow and breathing. Ask for help if you feel weak or unsteady.  Return to your normal activities as told by your health care provider. Ask your health care provider what activities are safe for you. Managing cramping and bloating  Try walking around when you have cramps or feel bloated.  Apply heat to your abdomen as told by your health care provider. Use the heat source that your health care provider recommends, such as a moist heat pack or a heating pad. ? Place a towel between your skin and the heat source. ? Leave the heat on for 20-30 minutes. ? Remove the heat if your skin turns bright red. This is especially important if you are unable to feel pain, heat, or cold. You may have a greater risk of getting burned.   General instructions  If you were given a sedative during the procedure, it can affect you for several hours. Do not drive or operate machinery until your health care provider says that it is safe.  For the first 24 hours after the procedure: ? Do not sign important documents. ? Do not drink alcohol. ? Do your regular daily activities at a slower pace than normal. ? Eat soft foods that are easy to digest.  Take over-the-counter and prescription medicines only as told by your health care provider.  Keep all follow-up visits as told by your health care provider. This  is important. Contact a health care provider if:  You have blood in your stool 2-3 days after the procedure. Get help right away if you have:  More than a small spotting of blood in your stool.  Large blood clots in your stool.  Swelling of your abdomen.  Nausea or vomiting.  A fever.  Increasing pain in your abdomen that is not relieved with medicine. Summary  After the procedure, it is common to have a small amount of blood in your stool. You may also have mild cramping and bloating of your abdomen.  If you were given a sedative during the procedure, it can affect you for several hours. Do not drive or operate machinery until your health care provider says that it is safe.  Get help right away if you have a lot of blood in your stool, nausea or vomiting, a fever, or increased pain in your abdomen. This information is not intended to replace advice given to you by your health care provider. Make sure you discuss any questions you have with your health care provider. Document Revised: 04/19/2019  Document Reviewed: 11/19/2018 Elsevier Patient Education  [place]. Colon Polyps  Colon polyps are tissue growths inside the colon, which is part of the large intestine. They are one of the types of polyps that can grow in the body. A polyp may be a round bump or a mushroom-shaped growth. You could have one polyp or more than one. Most colon polyps are noncancerous (benign). However, some colon polyps can become cancerous over time. Finding and removing the polyps early can help prevent this. What are the causes? The exact cause of colon polyps is not known. What increases the risk? The following factors may make you more likely to develop this condition:  Having a family history of colorectal cancer or colon polyps.  Being older than 70 years of age.  Being younger than 70 years of age and having a significant family history of colorectal cancer or colon polyps or a genetic  condition that puts you at higher risk of getting colon polyps.  Having inflammatory bowel disease, such as ulcerative colitis or Crohn's disease.  Having certain conditions passed from parent to child (hereditary conditions), such as: ? Familial adenomatous polyposis (FAP). ? Lynch syndrome. ? Turcot syndrome. ? Peutz-Jeghers syndrome. ? MUTYH-associated polyposis (MAP).  Being overweight.  Certain lifestyle factors. These include smoking cigarettes, drinking too much alcohol, not getting enough exercise, and eating a diet that is high in fat and red meat and low in fiber.  Having had childhood cancer that was treated with radiation of the abdomen. What are the signs or symptoms? Many times, there are no symptoms. If you have symptoms, they may include:  Blood coming from the rectum during a bowel movement.  Blood in the stool (feces). The blood may be bright red or very dark in color.  Pain in the abdomen.  A change in bowel habits, such as constipation or diarrhea. How is this diagnosed? This condition is diagnosed with a colonoscopy. This is a procedure in which a lighted, flexible scope is inserted into the opening between the buttocks (anus) and then passed into the colon to examine the area. Polyps are sometimes found when a colonoscopy is done as part of routine cancer screening tests. How is this treated? This condition is treated by removing any polyps that are found. Most polyps can be removed during a colonoscopy. Those polyps will then be tested for cancer. Additional treatment may be needed depending on the results of testing. Follow these instructions at home: Eating and drinking  Eat foods that are high in fiber, such as fruits, vegetables, and whole grains.  Eat foods that are high in calcium and vitamin D, such as milk, cheese, yogurt, eggs, liver, fish, and broccoli.  Limit foods that are high in fat, such as fried foods and desserts.  Limit the amount of  red meat, precooked or cured meat, or other processed meat that you eat, such as hot dogs, sausages, bacon, or meat loaves.  Limit sugary drinks.   Lifestyle  Maintain a healthy weight, or lose weight if recommended by your health care provider.  Exercise every day or as told by your health care provider.  Do not use any products that contain nicotine or tobacco, such as cigarettes, e-cigarettes, and chewing tobacco. If you need help quitting, ask your health care provider.  Do not drink alcohol if: ? Your health care provider tells you not to drink. ? You are pregnant, may be pregnant, or are planning to become pregnant.  If  you drink alcohol: ? Limit how much you use to:  0-1 drink a day for women.  0-2 drinks a day for men. ? Know how much alcohol is in your drink. In the [place] one drink equals one 12 oz bottle of beer (355 mL), one 5 oz glass of wine (148 mL), or one 1 oz glass of hard liquor (44 mL). General instructions  Take over-the-counter and prescription medicines only as told by your health care provider.  Keep all follow-up visits. This is important. This includes having regularly scheduled colonoscopies. Talk to your health care provider about when you need a colonoscopy. Contact a health care provider if:  You have new or worsening bleeding during a bowel movement.  You have new or increased blood in your stool.  You have a change in bowel habits.  You lose weight for no known reason. Summary  Colon polyps are tissue growths inside the colon, which is part of the large intestine. They are one type of polyp that can grow in the body.  Most colon polyps are noncancerous (benign), but some can become cancerous over time.  This condition is diagnosed with a colonoscopy.  This condition is treated by removing any polyps that are found. Most polyps can be removed during a colonoscopy. This information is not intended to replace advice given to you by your health  care provider. Make sure you discuss any questions you have with your health care provider. Document Revised: 08/14/2019 Document Reviewed: 08/14/2019 Elsevier Patient Education  2021 [place].  Colonoscopy Discharge Instructions  Read the instructions outlined below and refer to this sheet in the next few weeks. These discharge instructions provide you with general information on caring for yourself after you leave the hospital. Your doctor may also give you specific instructions. While your treatment has been planned according to the most current medical practices available, unavoidable complications occasionally occur.   ACTIVITY  You may resume your regular activity, but move at a slower pace for the next 24 hours.   Take frequent rest periods for the next 24 hours.   Walking will help get rid of the air and reduce the bloated feeling in your belly (abdomen).   No driving for 24 hours (because of the medicine (anesthesia) used during the test).    Do not sign any important legal documents or operate any machinery for 24 hours (because of the anesthesia used during the test).  NUTRITION  Drink plenty of fluids.   You may resume your normal diet as instructed by your doctor.   Begin with a light meal and progress to your normal diet. Heavy or fried foods are harder to digest and may make you feel sick to your stomach (nauseated).   Avoid alcoholic beverages for 24 hours or as instructed.  MEDICATIONS  You may resume your normal medications unless your doctor tells you otherwise.  WHAT YOU CAN EXPECT TODAY  Some feelings of bloating in the abdomen.   Passage of more gas than usual.   Spotting of blood in your stool or on the toilet paper.  IF YOU HAD POLYPS REMOVED DURING THE COLONOSCOPY:  No aspirin products for 7 days or as instructed.   No alcohol for 7 days or as instructed.   Eat a soft diet for the next 24 hours.  FINDING OUT THE RESULTS OF YOUR TEST Not all  test results are available during your visit. If your test results are not back during the visit, make  an appointment with your caregiver to find out the results. Do not assume everything is normal if you have not heard from your caregiver or the medical facility. It is important for you to follow up on all of your test results.  SEEK IMMEDIATE MEDICAL ATTENTION IF:  You have more than a spotting of blood in your stool.   Your belly is swollen (abdominal distention).   You are nauseated or vomiting.   You have a temperature over 101.   You have abdominal pain or discomfort that is severe or gets worse throughout the day.   Your colonoscopy revealed 1 polyp(s) which I removed successfully. Await pathology results, my office will contact you. I recommend repeating colonoscopy in 5 years for surveillance purposes. You also have diverticulosis and internal hemorrhoids. I would recommend increasing fiber in your diet or adding OTC Benefiber/Metamucil. Be sure to drink at least 4 to 6 glasses of water daily. Follow-up with GI as previously scheduled.     I hope you have a great rest of your week!  Elon Alas. Abbey Chatters, D.O. Gastroenterology and Hepatology [facility]

## 2020-09-22 NOTE — Anesthesia Postprocedure Evaluation (Signed)
Anesthesia Post Note  Patient: Lori Lopez  Procedure(s) Performed: COLONOSCOPY WITH PROPOFOL (N/A ) POLYPECTOMY  Patient location during evaluation: Phase II Anesthesia Type: General Level of consciousness: awake Pain management: pain level controlled Vital Signs Assessment: post-procedure vital signs reviewed and stable Respiratory status: spontaneous breathing and respiratory function stable Cardiovascular status: blood pressure returned to baseline and stable Postop Assessment: no headache and no apparent nausea or vomiting Anesthetic complications: no Comments: Late entry   No complications documented.   Last Vitals:  Vitals:   09/22/20 1012 09/22/20 1057  BP: (!) 144/60   Pulse: 79 71  Resp: 16 15  Temp: 36.9 C 36.6 C  SpO2: 93% 95%    Last Pain:  Vitals:   09/22/20 1057  TempSrc: Oral  PainSc: 0-No pain                 Louann Sjogren

## 2020-09-22 NOTE — Op Note (Signed)
[facility] Patient Name: Lori Lopez Procedure Date: 09/22/2020 10:30 AM MRN: [id number] Date of Birth: 1951/02/10 Attending MD: Elon Alas. Abbey Chatters DO CSN: [id number] Age: 70 Admit Type: Outpatient Procedure:                Colonoscopy Indications:              Rectal bleeding, Change in bowel habits Providers:                Elon Alas. Abbey Chatters, DO, Caprice Kluver, Tammy Vaught,                            RN, Nelma Rothman, Technician Referring MD:              Medicines:                See the Anesthesia note for documentation of the                            administered medications Complications:            No immediate complications. Estimated Blood Loss:     Estimated blood loss was minimal. Procedure:                Pre-Anesthesia Assessment:                           - The anesthesia plan was to use monitored                            anesthesia care (MAC).                           After obtaining informed consent, the colonoscope                            was passed under direct vision. Throughout the                            procedure, the patient's blood pressure, pulse, and                            oxygen saturations were monitored continuously. The                            [id number]-H190DL ([id number]) scope was introduced through                            the anus and advanced to the the cecum, identified                            by appendiceal orifice and ileocecal valve. The                            colonoscopy was performed without difficulty. The                            patient tolerated the  procedure well. The quality                            of the bowel preparation was evaluated using the                            BBPS [facility] Bowel Preparation Scale) with scores                            of: Right Colon = 3, Transverse Colon = 3 and Left                            Colon = 3 (entire mucosa seen well with no residual                             staining, small fragments of stool or opaque                            liquid). The total BBPS score equals 9. Scope In: 10:45:41 AM Scope Out: 10:53:19 AM Scope Withdrawal Time: 0 hours 6 minutes 12 seconds  Total Procedure Duration: 0 hours 7 minutes 38 seconds  Findings:      The perianal and digital rectal examinations were normal.      Non-bleeding internal hemorrhoids were found during endoscopy.      A few small-mouthed diverticula were found in the sigmoid colon.      A 6 mm polyp was found in the proximal transverse colon. The polyp was       sessile. The polyp was removed with a cold snare. Resection and       retrieval were complete.      A single medium-sized angiodysplastic lesion without bleeding was found       in the cecum. Impression:               - Non-bleeding internal hemorrhoids.                           - Diverticulosis in the sigmoid colon.                           - One 6 mm polyp in the proximal transverse colon,                            removed with a cold snare. Resected and retrieved.                           - A single non-bleeding colonic angiodysplastic                            lesion. Moderate Sedation:      Per Anesthesia Care Recommendation:           - Patient has a contact number available for                            emergencies. The signs and symptoms of potential  delayed complications were discussed with the                            patient. Return to normal activities tomorrow.                            Written discharge instructions were provided to the                            patient.                           - Resume previous diet.                           - Continue present medications.                           - Await pathology results.                           - Repeat colonoscopy in 5 years for surveillance.                           - Return to GI clinic as previously scheduled. Procedure  Code(s):        --- Professional ---                           [id number], Colonoscopy, flexible; with removal of                            tumor(s), polyp(s), or other lesion(s) by snare                            technique Diagnosis Code(s):        --- Professional ---                           K64.8, Other hemorrhoids                           K63.5, Polyp of colon                           K55.20, Angiodysplasia of colon without hemorrhage                           K62.5, Hemorrhage of anus and rectum                           R19.4, Change in bowel habit                           K57.30, Diverticulosis of large intestine without                            perforation or abscess without bleeding CPT copyright 2019 American  Medical Association. All rights reserved. The codes documented in this report are preliminary and upon coder review may  be revised to meet current compliance requirements. Elon Alas. Abbey Chatters, DO [place] Abbey Chatters, DO 09/22/2020 10:57:14 AM This report has been signed electronically. Number of Addenda: 0

## 2020-09-22 NOTE — Transfer of Care (Signed)
Immediate Anesthesia Transfer of Care Note  Patient: Lori Lopez  Procedure(s) Performed: COLONOSCOPY WITH PROPOFOL (N/A ) POLYPECTOMY  Patient Location: Short Stay  Anesthesia Type:General  Level of Consciousness: awake, alert , oriented and patient cooperative  Airway & Oxygen Therapy: Patient Spontanous Breathing  Post-op Assessment: Report given to RN, Post -op Vital signs reviewed and stable and Patient moving all extremities  Post vital signs: Reviewed and stable  Last Vitals:  Vitals Value Taken Time  BP    Temp    Pulse    Resp    SpO2      Last Pain:  Vitals:   09/22/20 1042  TempSrc:   PainSc: 0-No pain      Patients Stated Pain Goal: 8 (39/03/00 9233)  Complications: No complications documented.

## 2020-09-22 NOTE — Anesthesia Procedure Notes (Signed)
Performed by: Alyn Riedinger L, CRNA Pre-anesthesia Checklist: Patient identified, Emergency Drugs available, Suction available, Patient being monitored and Timeout performed Patient Re-evaluated:Patient Re-evaluated prior to induction Oxygen Delivery Method: Nasal cannula Induction Type: IV induction       

## 2020-09-22 NOTE — Anesthesia Preprocedure Evaluation (Signed)
Anesthesia Evaluation  Patient identified by MRN, date of birth, ID band Patient awake    Reviewed: Allergy & Precautions, H&P , NPO status , Patient's Chart, lab work & pertinent test results, reviewed documented beta blocker date and time   Airway Mallampati: II  TM Distance: >3 FB Neck ROM: full    Dental no notable dental hx.    Pulmonary COPD, Current Smoker and Patient abstained from smoking.,    Pulmonary exam normal breath sounds clear to auscultation       Cardiovascular Exercise Tolerance: Good negative cardio ROS   Rhythm:regular Rate:Normal     Neuro/Psych PSYCHIATRIC DISORDERS Anxiety negative neurological ROS     GI/Hepatic Neg liver ROS, GERD  Medicated,  Endo/Other  Hypothyroidism   Renal/GU negative Renal ROS  negative genitourinary   Musculoskeletal   Abdominal   Peds  Hematology negative hematology ROS (+)   Anesthesia Other Findings   Reproductive/Obstetrics negative OB ROS                             Anesthesia Physical Anesthesia Plan  ASA: III  Anesthesia Plan: General   Post-op Pain Management:    Induction:   PONV Risk Score and Plan: Propofol infusion  Airway Management Planned:   Additional Equipment:   Intra-op Plan:   Post-operative Plan:   Informed Consent: I have reviewed the patients History and Physical, chart, labs and discussed the procedure including the risks, benefits and alternatives for the proposed anesthesia with the patient or authorized representative who has indicated his/her understanding and acceptance.     Dental Advisory Given  Plan Discussed with: CRNA  Anesthesia Plan Comments:         Anesthesia Quick Evaluation

## 2020-09-23 LAB — SURGICAL PATHOLOGY

## 2020-09-28 ENCOUNTER — Encounter (HOSPITAL_COMMUNITY): Payer: Self-pay | Admitting: Internal Medicine

## 2020-11-12 ENCOUNTER — Other Ambulatory Visit (HOSPITAL_COMMUNITY): Payer: Self-pay | Admitting: *Deleted

## 2020-11-12 DIAGNOSIS — B372 Candidiasis of skin and nail: Secondary | ICD-10-CM | POA: Diagnosis not present

## 2020-11-12 DIAGNOSIS — R051 Acute cough: Secondary | ICD-10-CM | POA: Diagnosis not present

## 2021-01-13 ENCOUNTER — Ambulatory Visit: Payer: PPO | Admitting: Gastroenterology

## 2021-01-13 ENCOUNTER — Encounter: Payer: Self-pay | Admitting: Internal Medicine

## 2021-02-01 DIAGNOSIS — R059 Cough, unspecified: Secondary | ICD-10-CM | POA: Diagnosis not present

## 2021-02-01 DIAGNOSIS — U071 COVID-19: Secondary | ICD-10-CM | POA: Diagnosis not present

## 2021-02-01 DIAGNOSIS — Z20822 Contact with and (suspected) exposure to covid-19: Secondary | ICD-10-CM | POA: Diagnosis not present

## 2021-02-03 IMAGING — CT CT CHEST LUNG CANCER SCREENING LOW DOSE W/O CM
1 series · 15 of 32 positions shown, 19 images · non-contrast
Comparison: None.

CLINICAL DATA: Current smoker. Fifty-four pack-year history.
Asymptomatic

EXAM:
CT CHEST WITHOUT CONTRAST LOW-DOSE FOR LUNG CANCER SCREENING
TECHNIQUE: Multidetector CT imaging of the chest was performed following the
standard protocol without IV contrast.

[Series 6: sagittal · sagittal · 0.59mm/px · 15 of 366 slices shown, 19 images]
[im 28/366  mediastinal]
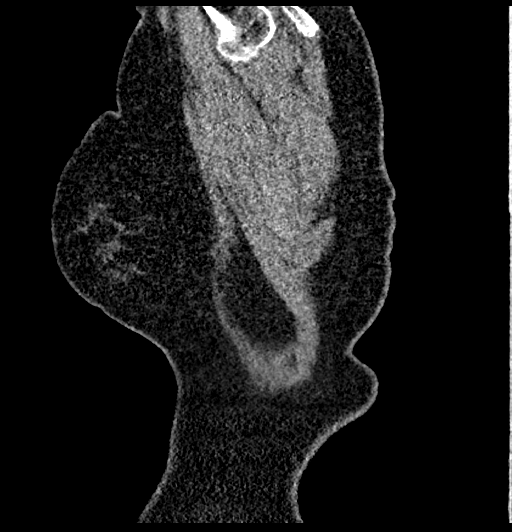
[im 28/366  lung]
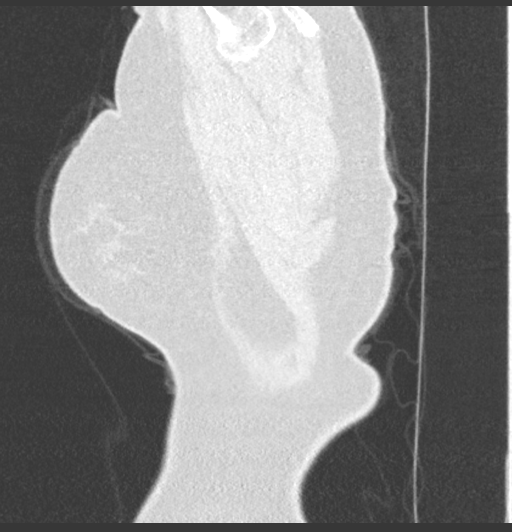
[im 55/366  lung]
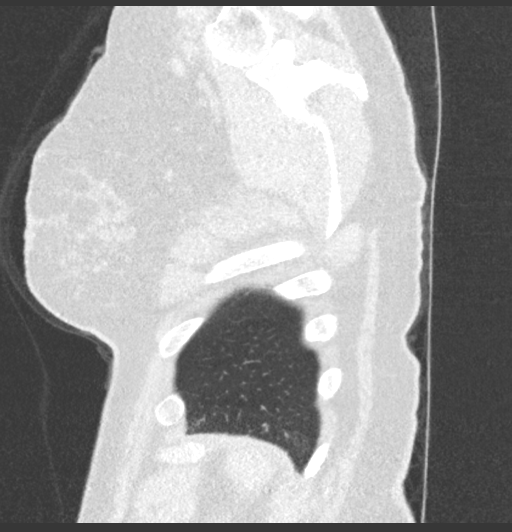
[im 74/366  lung]
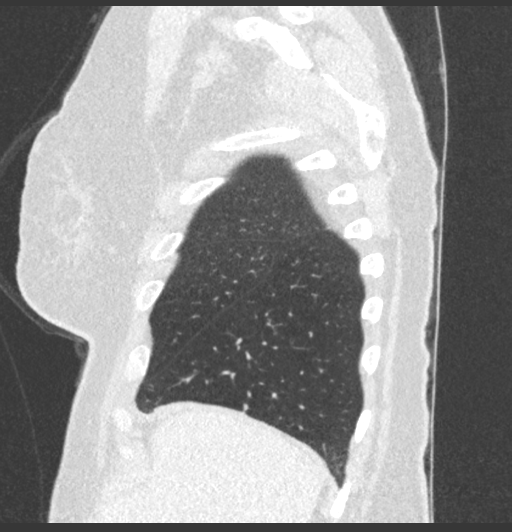
[im 95/366  lung]
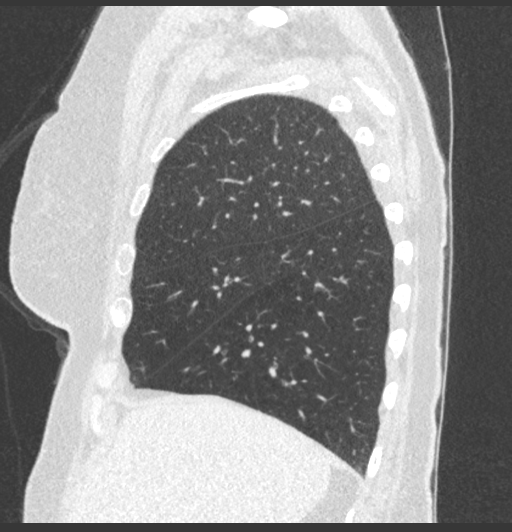
[im 122/366  mediastinal]
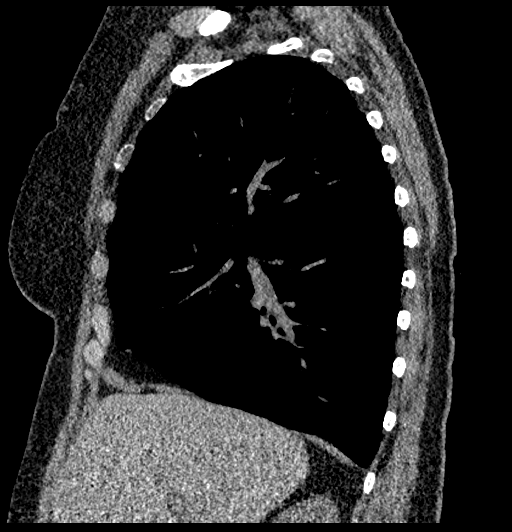
[im 122/366  lung]
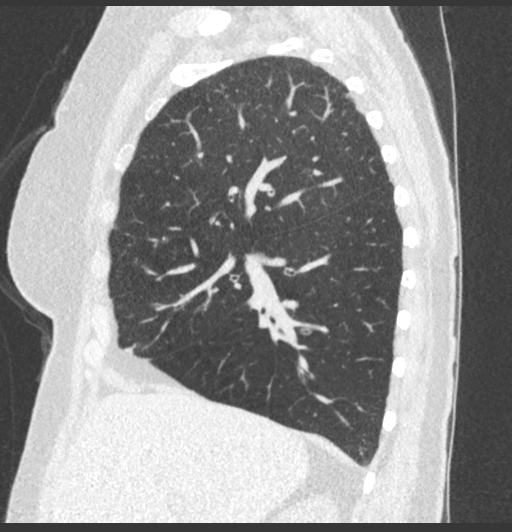
[im 147/366  lung]
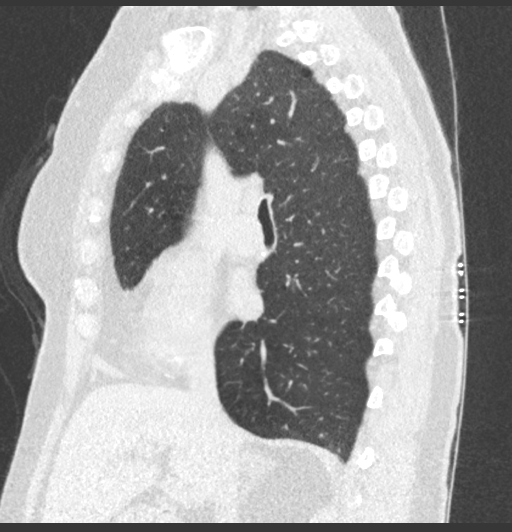
[im 163/366  lung]
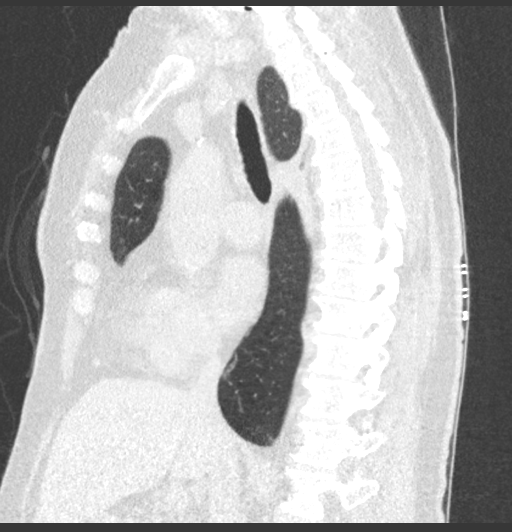
[im 190/366  lung]
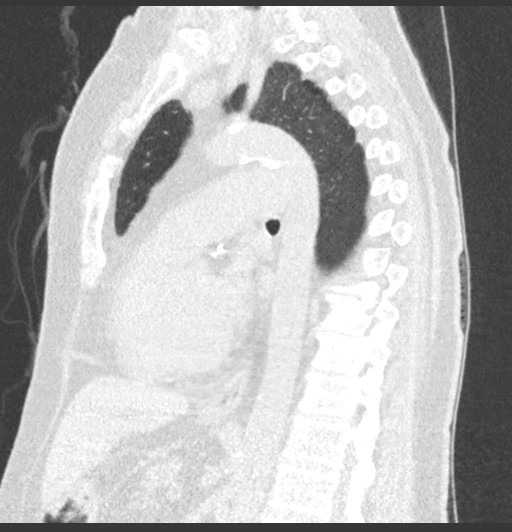
[im 217/366  mediastinal]
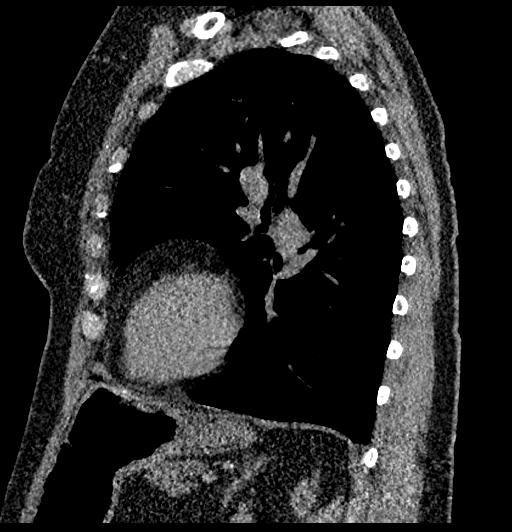
[im 217/366  lung]
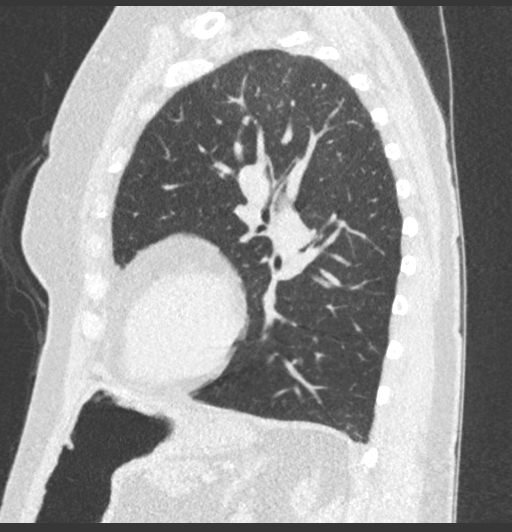
[im 230/366  lung]
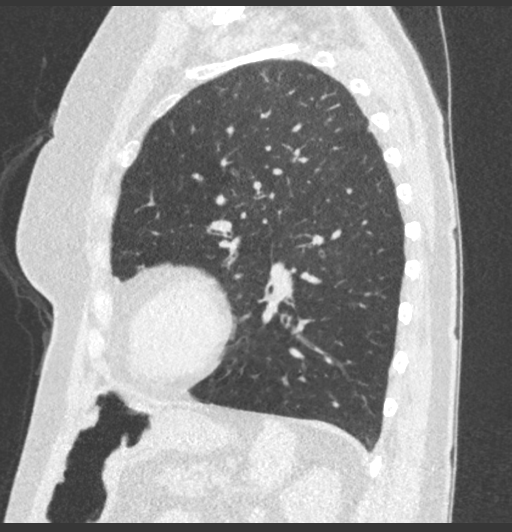
[im 257/366  lung]
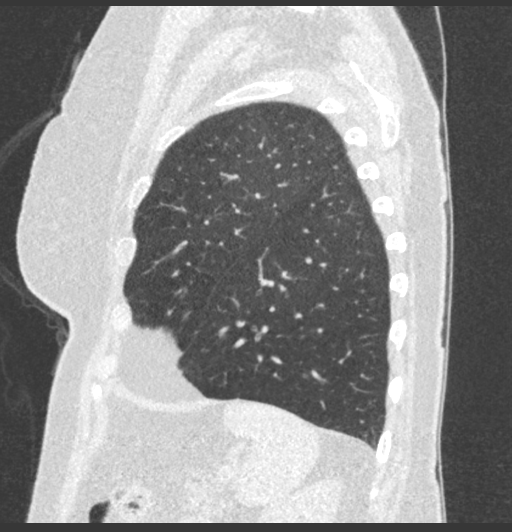
[im 284/366  lung]
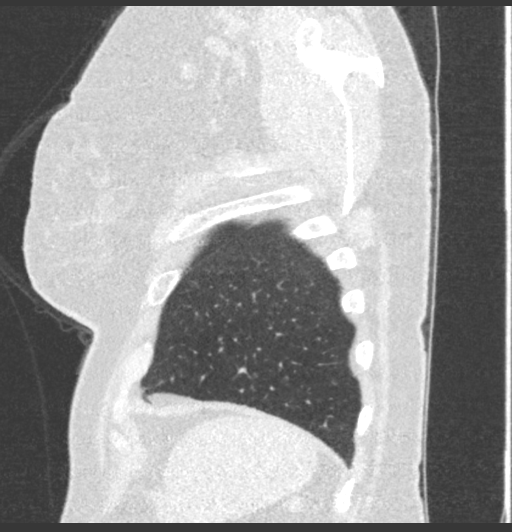
[im 298/366  mediastinal]
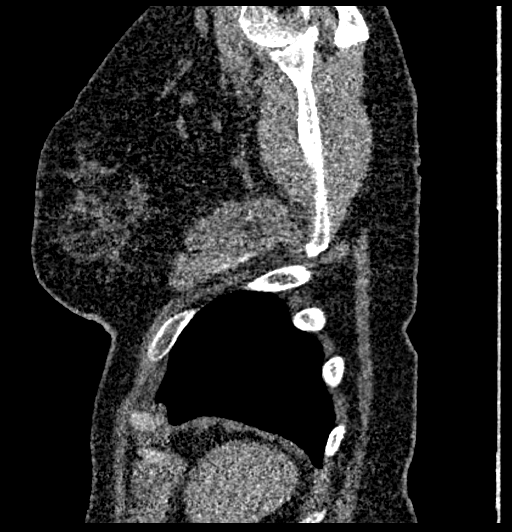
[im 298/366  lung]
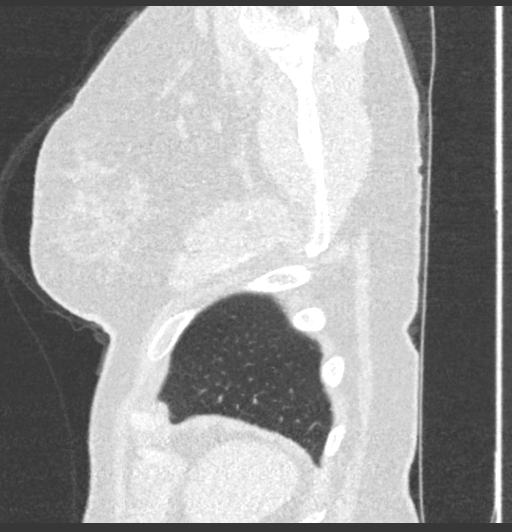
[im 325/366  lung]
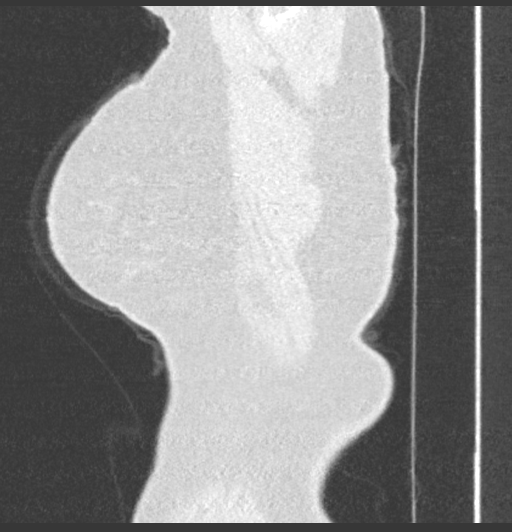
[im 352/366  lung]
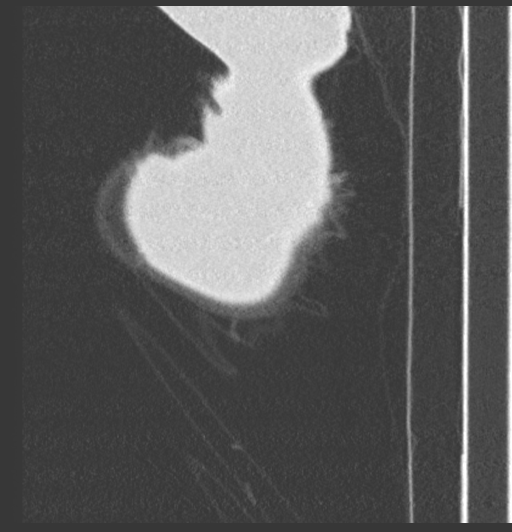

[15 of 32 positions shown; findings below may reference images not displayed]

FINDINGS: Cardiovascular: Heart size appears normal. No pericardial effusion.
Aortic atherosclerosis. Three vessel coronary artery atherosclerotic
calcifications.

Mediastinum/Nodes: No enlarged mediastinal, hilar, or axillary lymph
nodes. Thyroid gland, trachea, and esophagus demonstrate no
significant findings.

Lungs/Pleura: Mild centrilobular emphysema and diffuse bronchial
wall thickening. Scarring is identified within the right middle
lobe. No pleural effusion, airspace consolidation, or atelectasis.
Several small pulmonary nodules are identified. The largest is in
the left lower lobe with an equivalent diameter of 4.2 mm

Upper Abdomen: No acute abnormality within the imaged portions of
the upper abdomen. Previous cholecystectomy.

Musculoskeletal: Mild multilevel thoracic spondylosis. No acute or
aggressive osseous abnormalities.
IMPRESSION: 1. Lung-RADS 2, benign appearance or behavior. Continue annual
screening with low-dose chest CT without contrast in 12 months.
2. Diffuse bronchial wall thickening with emphysema, as above;
imaging findings suggestive of underlying COPD.
3. Three vessel coronary artery atherosclerotic calcifications.
4. Emphysema and aortic atherosclerosis.

Aortic Atherosclerosis (JVVVO-CS6.6) and Emphysema (JVVVO-CGL.C).

## 2021-02-07 DIAGNOSIS — Z20822 Contact with and (suspected) exposure to covid-19: Secondary | ICD-10-CM | POA: Diagnosis not present

## 2021-06-04 ENCOUNTER — Ambulatory Visit: Payer: PPO | Admitting: Gastroenterology

## 2021-06-30 ENCOUNTER — Encounter: Payer: Self-pay | Admitting: Gastroenterology

## 2021-06-30 ENCOUNTER — Ambulatory Visit: Payer: PPO | Admitting: Gastroenterology

## 2021-06-30 ENCOUNTER — Other Ambulatory Visit: Payer: Self-pay

## 2021-06-30 VITALS — BP 132/64 | HR 80 | Temp 97.7°F | Ht 62.0 in | Wt 154.8 lb

## 2021-06-30 DIAGNOSIS — K5900 Constipation, unspecified: Secondary | ICD-10-CM | POA: Diagnosis not present

## 2021-06-30 DIAGNOSIS — K219 Gastro-esophageal reflux disease without esophagitis: Secondary | ICD-10-CM | POA: Diagnosis not present

## 2021-06-30 DIAGNOSIS — K649 Unspecified hemorrhoids: Secondary | ICD-10-CM | POA: Diagnosis not present

## 2021-06-30 MED ORDER — LINACLOTIDE 72 MCG PO CAPS: 72.0000 ug | ORAL_CAPSULE | Freq: Every day | ORAL | 5 refills | Status: DC | PRN

## 2021-06-30 NOTE — Progress Notes (Signed)
Primary Care Physician: Celene Squibb, MD  Primary Gastroenterologist:  Elon Alas. Abbey Chatters, DO   Chief Complaint  Patient presents with   Hemorrhoids    Has some bleeding   Constipation    Takes Linzess 72 once/week. Has diarrhea if takes linzess daily   Gastroesophageal Reflux    HPI: Lori Lopez is a 71 y.o. female here for follow up. Last seen in office in 07/2020 for rectal bleeding, GERD, abdominal pain, constipation.   Overall doing okay. Takes Linzess only if no BM after 4-5 days. Usually no more than 1-2 per week. Trying to eat more fiber to help with stools. If she takes Linzess daily then she has diarrhea. Before her stools changed, she had 3 BMs every morning. She did not tolerate Amitiza 43mcg daily due to diarrhea. Insurance would not pay for 37mcg dose. Failed miralax. She has to push her hemorrhoid back in every time she has a BM. Sometimes it bleeds. Does not want to have surgery. States her abdominal hernia not bothering her anymore.   Overall feels like GERD is doing ok. She takes pantoprazole every morning and takes evening dose only when needed. History of chronic hoarseness, evaluated by ENT several occasions and diagnosed with Reinke's edema which is often noted in chronic smokers but acid reflux can play a role as well.  H2 blocker without adequate relief.   CT abdomen and pelvis in June 2021.  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  This area appears benign, particularly given stability since 2017.  Fatty infiltration of the ileocecal valve.  Had images reevaluated because of suspected right upper quadrant hernia on exam.  Addendum noted, stable defect in the rectus muscles slightly to the right of midline at the level of the anterior liver dome measuring 1.6 cm which contains a small amount of fat.   Colonoscopy 09/2020: - Non-bleeding internal hemorrhoids. - Diverticulosis in the sigmoid colon. - One 6 mm  polyp in the proximal transverse colon, removed with a cold snare. Resected and retrieved. Tubular adenoma. - A single non-bleeding colonic angiodysplastic lesion. -next colonoscopy in five years.  Current Outpatient Medications  Medication Sig Dispense Refill   ALPRAZolam (XANAX) 1 MG tablet Take 0.5 mg by mouth daily as needed for anxiety.     aspirin EC 81 MG tablet Take 81 mg by mouth daily. Swallow whole.     Cholecalciferol (VITAMIN D3) 1000 units CAPS Take 1,000 Units by mouth daily.     gabapentin (NEURONTIN) 300 MG capsule Take daily midday with lunch for shingles pain for 14 days (Patient taking differently: Take 300 mg by mouth 2 (two) times daily.) 14 capsule 0   linaclotide (LINZESS) 72 MCG capsule Take 72 mcg by mouth as needed. Takes once/week     pantoprazole (PROTONIX) 40 MG tablet Take 40 mg by mouth 2 (two) times daily.     terbinafine (LAMISIL) 1 % cream Apply 1 application topically daily as needed (athlete's foot).     No current facility-administered medications for this visit.    Allergies as of 06/30/2021 - Review Complete 06/30/2021  Allergen Reaction Noted   Shrimp [shellfish allergy] Anaphylaxis 10/10/2014   Hydrocodone Hives and Itching 10/10/2014   Lyrica [pregabalin] Other (See Comments) 10/10/2014    ROS:  General: Negative for anorexia, weight loss, fever, chills, fatigue, weakness. ENT: Negative for difficulty swallowing , nasal congestion. Chronic hoarseness CV: Negative for chest pain, angina, palpitations, dyspnea on  exertion, peripheral edema.  Respiratory: Negative for dyspnea at rest, dyspnea on exertion, cough, sputum, wheezing.  GI: See history of present illness. GU:  Negative for dysuria, hematuria, urinary incontinence, urinary frequency, nocturnal urination.  Endo: Negative for unusual weight change.    Physical Examination:   BP 132/64    Pulse 80    Temp 97.7 F (36.5 C) (Temporal)    Ht 5\' 2"  (1.575 m)    Wt 154 lb 12.8 oz (70.2  kg)    BMI 28.31 kg/m   General: Well-nourished, well-developed in no acute distress.  Eyes: No icterus. Mouth: masked Lungs: Clear to auscultation bilaterally.  Heart: Regular rate and rhythm, no murmurs rubs or gallops.  Abdomen: Bowel sounds are normal, nontender, nondistended, no HSM or masses.  Extremities: No lower extremity edema. No clubbing or deformities. Neuro: Alert and oriented x 4   Skin: Warm and dry, no jaundice.   Psych: Alert and cooperative, normal mood and affect.  Labs:   Lab Results  Component Value Date   WBC 10.5 07/28/2020   HGB 14.7 07/28/2020   HCT 44.4 07/28/2020   MCV 93.9 07/28/2020   PLT 342 07/28/2020     Imaging Studies: No results found.   Assessment:  GERD: typical reflux symptoms better back on PPI. Typically taking once daily sometimes takes second dose.  Reinforced antireflux measures.   Constipation: Linzess helpful but she goes days before taking. Encouraged her to take if no BM in 48 hours. Don't go longer without addressing constipation.  Avoid straining.   Hemorrhoids: discussed options of topical regimen or banding. She is not interested at this time.    Plan: Continue pantoprazole once or twice daily. Take Linzss 23mcg daily if no BM in 48 hours.  Patient to call if decides to pursue treatment of hemorrhoids.  Return to the office in one year or sooner if needed. [facility] [place]

## 2021-06-30 NOTE — Patient Instructions (Addendum)
Continue pantoprazole once or twice daily for acid reflux. Take Linzess if no bowel movement in 48 hours. Don't go longer than 48 hours without a bowel movement.  If you decide to pursue hemorrhoid banding or cream let me know. We will see you back in one year but call sooner with any questions or concerns.  Dr. Benjamine Mola no longer comes to [place]. He has a Network engineer in [place]. Also there is [facility] in [place].

## 2021-08-19 DIAGNOSIS — E782 Mixed hyperlipidemia: Secondary | ICD-10-CM | POA: Diagnosis not present

## 2021-08-25 ENCOUNTER — Other Ambulatory Visit (HOSPITAL_COMMUNITY): Payer: Self-pay | Admitting: Family Medicine

## 2021-08-25 ENCOUNTER — Other Ambulatory Visit: Payer: Self-pay | Admitting: Family Medicine

## 2021-08-25 DIAGNOSIS — F411 Generalized anxiety disorder: Secondary | ICD-10-CM | POA: Diagnosis not present

## 2021-08-25 DIAGNOSIS — Z122 Encounter for screening for malignant neoplasm of respiratory organs: Secondary | ICD-10-CM | POA: Diagnosis not present

## 2021-08-25 DIAGNOSIS — G629 Polyneuropathy, unspecified: Secondary | ICD-10-CM | POA: Diagnosis not present

## 2021-08-25 DIAGNOSIS — F17200 Nicotine dependence, unspecified, uncomplicated: Secondary | ICD-10-CM | POA: Diagnosis not present

## 2021-08-25 DIAGNOSIS — F17210 Nicotine dependence, cigarettes, uncomplicated: Secondary | ICD-10-CM | POA: Diagnosis not present

## 2021-08-25 DIAGNOSIS — E039 Hypothyroidism, unspecified: Secondary | ICD-10-CM | POA: Diagnosis not present

## 2021-08-25 DIAGNOSIS — J449 Chronic obstructive pulmonary disease, unspecified: Secondary | ICD-10-CM | POA: Diagnosis not present

## 2021-08-25 DIAGNOSIS — K219 Gastro-esophageal reflux disease without esophagitis: Secondary | ICD-10-CM | POA: Diagnosis not present

## 2021-08-25 DIAGNOSIS — R32 Unspecified urinary incontinence: Secondary | ICD-10-CM | POA: Diagnosis not present

## 2021-08-25 DIAGNOSIS — K5904 Chronic idiopathic constipation: Secondary | ICD-10-CM | POA: Diagnosis not present

## 2021-08-25 DIAGNOSIS — E782 Mixed hyperlipidemia: Secondary | ICD-10-CM | POA: Diagnosis not present

## 2021-08-25 DIAGNOSIS — D751 Secondary polycythemia: Secondary | ICD-10-CM | POA: Diagnosis not present

## 2021-08-25 DIAGNOSIS — J439 Emphysema, unspecified: Secondary | ICD-10-CM | POA: Diagnosis not present

## 2021-08-25 DIAGNOSIS — E663 Overweight: Secondary | ICD-10-CM | POA: Diagnosis not present

## 2021-08-25 DIAGNOSIS — G8929 Other chronic pain: Secondary | ICD-10-CM | POA: Diagnosis not present

## 2021-08-25 DIAGNOSIS — E785 Hyperlipidemia, unspecified: Secondary | ICD-10-CM | POA: Diagnosis not present

## 2021-08-27 ENCOUNTER — Other Ambulatory Visit (HOSPITAL_COMMUNITY): Payer: Self-pay | Admitting: Internal Medicine

## 2021-08-27 DIAGNOSIS — Z1231 Encounter for screening mammogram for malignant neoplasm of breast: Secondary | ICD-10-CM

## 2021-09-06 ENCOUNTER — Ambulatory Visit (HOSPITAL_COMMUNITY)
Admission: RE | Admit: 2021-09-06 | Discharge: 2021-09-06 | Disposition: A | Discharge status: Home / Self Care | Payer: PPO | Source: Ambulatory Visit | Attending: Internal Medicine | Admitting: Internal Medicine

## 2021-09-06 DIAGNOSIS — Z1231 Encounter for screening mammogram for malignant neoplasm of breast: Secondary | ICD-10-CM | POA: Diagnosis not present

## 2021-09-09 ENCOUNTER — Other Ambulatory Visit (HOSPITAL_COMMUNITY): Payer: Self-pay | Admitting: Internal Medicine

## 2021-09-09 DIAGNOSIS — R928 Other abnormal and inconclusive findings on diagnostic imaging of breast: Secondary | ICD-10-CM

## 2021-09-21 DIAGNOSIS — J441 Chronic obstructive pulmonary disease with (acute) exacerbation: Secondary | ICD-10-CM | POA: Diagnosis not present

## 2021-09-23 ENCOUNTER — Ambulatory Visit (HOSPITAL_COMMUNITY)
Admission: RE | Admit: 2021-09-23 | Discharge: 2021-09-23 | Disposition: A | Discharge status: Home / Self Care | Payer: PPO | Source: Ambulatory Visit | Attending: Internal Medicine | Admitting: Internal Medicine

## 2021-09-23 ENCOUNTER — Encounter (HOSPITAL_COMMUNITY): Payer: Self-pay

## 2021-09-23 DIAGNOSIS — R922 Inconclusive mammogram: Secondary | ICD-10-CM | POA: Diagnosis not present

## 2021-09-23 DIAGNOSIS — R928 Other abnormal and inconclusive findings on diagnostic imaging of breast: Secondary | ICD-10-CM

## 2021-10-07 ENCOUNTER — Ambulatory Visit (HOSPITAL_COMMUNITY)
Admission: RE | Admit: 2021-10-07 | Discharge: 2021-10-07 | Disposition: A | Discharge status: Home / Self Care | Payer: PPO | Source: Ambulatory Visit | Attending: Family Medicine | Admitting: Family Medicine

## 2021-10-07 DIAGNOSIS — I2510 Atherosclerotic heart disease of native coronary artery without angina pectoris: Secondary | ICD-10-CM | POA: Diagnosis not present

## 2021-10-07 DIAGNOSIS — I700 Atherosclerosis of aorta: Secondary | ICD-10-CM | POA: Diagnosis not present

## 2021-10-07 DIAGNOSIS — J439 Emphysema, unspecified: Secondary | ICD-10-CM | POA: Diagnosis not present

## 2021-10-07 DIAGNOSIS — F17210 Nicotine dependence, cigarettes, uncomplicated: Secondary | ICD-10-CM | POA: Insufficient documentation

## 2021-10-07 DIAGNOSIS — Z122 Encounter for screening for malignant neoplasm of respiratory organs: Secondary | ICD-10-CM | POA: Insufficient documentation

## 2022-01-14 ENCOUNTER — Ambulatory Visit
Admission: EM | Admit: 2022-01-14 | Discharge: 2022-01-14 | Disposition: A | Discharge status: Home / Self Care | Payer: PPO | Attending: Emergency Medicine | Admitting: Emergency Medicine

## 2022-01-14 ENCOUNTER — Encounter: Payer: Self-pay | Admitting: Emergency Medicine

## 2022-01-14 DIAGNOSIS — J029 Acute pharyngitis, unspecified: Secondary | ICD-10-CM

## 2022-01-14 DIAGNOSIS — J209 Acute bronchitis, unspecified: Secondary | ICD-10-CM

## 2022-01-14 LAB — POCT RAPID STREP A (OFFICE): Rapid Strep A Screen: NEGATIVE

## 2022-01-14 MED ORDER — PREDNISONE 20 MG PO TABS: 20.0000 mg | ORAL_TABLET | Freq: Two times a day (BID) | ORAL | 0 refills | Status: DC

## 2022-01-14 MED ORDER — DEXAMETHASONE SODIUM PHOSPHATE 10 MG/ML IJ SOLN
10.0000 mg | Freq: Once | INTRAMUSCULAR | Status: AC
  Administered 2022-01-14: 10 mg via INTRAMUSCULAR

## 2022-01-14 MED ORDER — IPRATROPIUM-ALBUTEROL 0.5-2.5 (3) MG/3ML IN SOLN: 3.0000 mL | RESPIRATORY_TRACT | 0 refills | Status: DC | PRN

## 2022-01-14 MED ORDER — IPRATROPIUM-ALBUTEROL 0.5-2.5 (3) MG/3ML IN SOLN
3.0000 mL | Freq: Once | RESPIRATORY_TRACT | Status: AC
  Administered 2022-01-14: 3 mL via RESPIRATORY_TRACT

## 2022-01-14 MED ORDER — DOXYCYCLINE HYCLATE 100 MG PO CAPS: 100.0000 mg | ORAL_CAPSULE | Freq: Two times a day (BID) | ORAL | 0 refills | Status: DC

## 2022-01-14 NOTE — Discharge Instructions (Addendum)
Plenty of fluids and take steroid as directed.  Start antibiotics if you develop fever or started having colored discharge.  Use duo Med-Neb every 4 hours to keep lungs open.  Return for any new or worsening symptoms for reevaluation.

## 2022-01-14 NOTE — ED Triage Notes (Signed)
Sore throat last night.  Productive cough that started today.  States throat hurts when she coughs.

## 2022-01-14 NOTE — ED Provider Notes (Signed)
Kila   MRN: [id number] DOB: December 28, 1950  Subjective:   Chief Complaint; No chief complaint on file.   Lori Lopez is a 71 y.o. female presenting for Sore throat last night.  Productive cough that started today.  States throat hurts when she coughs  No current facility-administered medications for this encounter.  Current Outpatient Medications:    doxycycline (VIBRAMYCIN) 100 MG capsule, Take 1 capsule (100 mg total) by mouth 2 (two) times daily., Disp: 20 capsule, Rfl: 0   ipratropium-albuterol (DUONEB) 0.5-2.5 (3) MG/3ML SOLN, Take 3 mLs by nebulization every 4 (four) hours as needed., Disp: 360 mL, Rfl: 0   predniSONE (DELTASONE) 20 MG tablet, Take 1 tablet (20 mg total) by mouth 2 (two) times daily with a meal for 5 days., Disp: 10 tablet, Rfl: 0   ALPRAZolam (XANAX) 1 MG tablet, Take 0.5 mg by mouth daily as needed for anxiety., Disp: , Rfl:    aspirin EC 81 MG tablet, Take 81 mg by mouth daily. Swallow whole., Disp: , Rfl:    Cholecalciferol (VITAMIN D3) 1000 units CAPS, Take 1,000 Units by mouth daily., Disp: , Rfl:    gabapentin (NEURONTIN) 300 MG capsule, Take daily midday with lunch for shingles pain for 14 days (Patient taking differently: Take 300 mg by mouth 2 (two) times daily.), Disp: 14 capsule, Rfl: 0   linaclotide (LINZESS) 72 MCG capsule, Take 1 capsule (72 mcg total) by mouth daily as needed., Disp: 30 capsule, Rfl: 5   pantoprazole (PROTONIX) 40 MG tablet, Take 40 mg by mouth 2 (two) times daily., Disp: , Rfl:    terbinafine (LAMISIL) 1 % cream, Apply 1 application topically daily as needed (athlete's foot)., Disp: , Rfl:    Allergies  Allergen Reactions   Shrimp [Shellfish Allergy] Anaphylaxis   Hydrocodone Hives and Itching   Lyrica [Pregabalin] Other (See Comments)    unknown    Past Medical History:  Diagnosis Date   Adjustment disorder with depressed mood 09/08/2009   Allergic rhinitis due to pollen 09/08/2009   Anxiety     Cigarette nicotine dependence, uncomplicated 06/20/9415   COPD (chronic obstructive pulmonary disease) ([place]) 11/05/2010   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    Objective:   Vitals: BP 115/67 (BP Location: Right Arm)   Pulse 87   Temp 97.7 F (36.5 C) (Oral)   Resp 16   SpO2 90%   Physical Exam Constitutional:      Appearance: Normal appearance. She is not toxic-appearing.  HENT:     Head: Atraumatic.     Right Ear: Tympanic membrane and external ear normal.     Left Ear: Tympanic membrane and external ear normal.     Nose: Nose normal. No congestion.     Mouth/Throat:     Mouth: Mucous membranes are moist.     Pharynx: Posterior oropharyngeal erythema present. No oropharyngeal exudate.     Comments: Raspy voice.   Eyes:     General: No scleral icterus.       Right eye: No discharge.        Left eye: No discharge.     Conjunctiva/sclera: Conjunctivae normal.  Cardiovascular:  Rate and Rhythm: Normal rate and regular rhythm.  Pulmonary:     Effort: Pulmonary effort is normal. No respiratory distress.     Breath sounds: No stridor. Wheezing and rhonchi present. No rales.     Comments: Patient speaking in short clipped sentences due to primarily to coughing spasms. Chest:     Chest wall: No tenderness.  Skin:    General: Skin is warm and dry.  Neurological:     Mental Status: She is alert.  Psychiatric:        Mood and Affect: Mood normal.     Results for orders placed or performed during the hospital encounter of 01/14/22 (from the past 24 hour(s))  POCT rapid strep A     Status: None   Collection Time: 01/14/22  7:35 PM  Result Value Ref Range   Rapid  Strep A Screen Negative Negative    No results found.     Assessment and Plan :   1. Acute bronchitis, unspecified organism   2. Acute pharyngitis, unspecified etiology     Meds ordered this encounter  Medications   ipratropium-albuterol (DUONEB) 0.5-2.5 (3) MG/3ML nebulizer solution 3 mL   dexamethasone (DECADRON) injection 10 mg   predniSONE (DELTASONE) 20 MG tablet    Sig: Take 1 tablet (20 mg total) by mouth 2 (two) times daily with a meal for 5 days.    Dispense:  10 tablet    Refill:  0    Order Specific Question:   Supervising Provider    Answer:   Chase Picket [id number]   doxycycline (VIBRAMYCIN) 100 MG capsule    Sig: Take 1 capsule (100 mg total) by mouth 2 (two) times daily.    Dispense:  20 capsule    Refill:  0    Order Specific Question:   Supervising Provider    Answer:   Chase Picket [id number]   ipratropium-albuterol (DUONEB) 0.5-2.5 (3) MG/3ML SOLN    Sig: Take 3 mLs by nebulization every 4 (four) hours as needed.    Dispense:  360 mL    Refill:  0    Order Specific Question:   Supervising Provider    Answer:   Chase Picket [[id number]]    MDM:  Lori Lopez is a 71 y.o. female who smokes and has COPD presenting for presenting for Sore throat last night.  Productive cough that started today.  States throat hurts when she coughs.  She denies fever.  She is short of breath more due to coughing spasms.  She is not taking her home med nebulizer because her solution has expired.  On exam patient has scattered rhonchi and wheezes with coughing spasms throughout physical exam.  Her oxygen saturation is low at 90%.  Duo Med-Neb treatment and Decadron 10 mg IM are given in clinic.  Patient's pulse ox cont same at 90%, lung sounds improved significantly to sounding clear without wheezing.  Patient states she is feeling overall much better.  Patient states she is always run low pulse ox even when she was in her 52s.  Rapid strep was negative.  It is most  likely patient is suffering from viral illness which is exacerbated her COPD symptoms.  I prescribed doxycycline and prednisone.  She will follow-up with her PCP and return for new or worsening symptoms.  I discussed todays findings, treatment plan, follow up and return instructions. Questions were answered. Patient/representative stated understanding of the instructions and patient is stable for discharge.  Leida Lauth FNP-C MSN     Hezzie Bump, NP 01/14/22 1946

## 2022-01-17 ENCOUNTER — Emergency Department (HOSPITAL_COMMUNITY): Payer: PPO

## 2022-01-17 ENCOUNTER — Other Ambulatory Visit: Payer: Self-pay

## 2022-01-17 ENCOUNTER — Inpatient Hospital Stay (HOSPITAL_COMMUNITY)
Admission: EM | Admit: 2022-01-17 | Discharge: 2022-01-19 | DRG: 190 | Disposition: A | Discharge status: Home / Self Care | Payer: PPO | Attending: Family Medicine | Admitting: Family Medicine

## 2022-01-17 ENCOUNTER — Encounter (HOSPITAL_COMMUNITY): Payer: Self-pay | Admitting: Emergency Medicine

## 2022-01-17 DIAGNOSIS — R0600 Dyspnea, unspecified: Secondary | ICD-10-CM | POA: Diagnosis not present

## 2022-01-17 DIAGNOSIS — F17210 Nicotine dependence, cigarettes, uncomplicated: Secondary | ICD-10-CM | POA: Diagnosis present

## 2022-01-17 DIAGNOSIS — Z20822 Contact with and (suspected) exposure to covid-19: Secondary | ICD-10-CM | POA: Diagnosis not present

## 2022-01-17 DIAGNOSIS — Z885 Allergy status to narcotic agent status: Secondary | ICD-10-CM | POA: Diagnosis not present

## 2022-01-17 DIAGNOSIS — R6510 Systemic inflammatory response syndrome (SIRS) of non-infectious origin without acute organ dysfunction: Secondary | ICD-10-CM | POA: Diagnosis not present

## 2022-01-17 DIAGNOSIS — Z888 Allergy status to other drugs, medicaments and biological substances status: Secondary | ICD-10-CM | POA: Diagnosis not present

## 2022-01-17 DIAGNOSIS — R739 Hyperglycemia, unspecified: Secondary | ICD-10-CM

## 2022-01-17 DIAGNOSIS — F419 Anxiety disorder, unspecified: Secondary | ICD-10-CM | POA: Diagnosis present

## 2022-01-17 DIAGNOSIS — E860 Dehydration: Secondary | ICD-10-CM | POA: Diagnosis not present

## 2022-01-17 DIAGNOSIS — Z833 Family history of diabetes mellitus: Secondary | ICD-10-CM

## 2022-01-17 DIAGNOSIS — R059 Cough, unspecified: Secondary | ICD-10-CM | POA: Diagnosis not present

## 2022-01-17 DIAGNOSIS — D72823 Leukemoid reaction: Secondary | ICD-10-CM | POA: Diagnosis present

## 2022-01-17 DIAGNOSIS — J301 Allergic rhinitis due to pollen: Secondary | ICD-10-CM | POA: Diagnosis present

## 2022-01-17 DIAGNOSIS — D72829 Elevated white blood cell count, unspecified: Secondary | ICD-10-CM

## 2022-01-17 DIAGNOSIS — Z7982 Long term (current) use of aspirin: Secondary | ICD-10-CM

## 2022-01-17 DIAGNOSIS — I959 Hypotension, unspecified: Secondary | ICD-10-CM | POA: Diagnosis not present

## 2022-01-17 DIAGNOSIS — R0902 Hypoxemia: Principal | ICD-10-CM

## 2022-01-17 DIAGNOSIS — J9601 Acute respiratory failure with hypoxia: Secondary | ICD-10-CM | POA: Diagnosis present

## 2022-01-17 DIAGNOSIS — J441 Chronic obstructive pulmonary disease with (acute) exacerbation: Principal | ICD-10-CM | POA: Diagnosis present

## 2022-01-17 DIAGNOSIS — Z8719 Personal history of other diseases of the digestive system: Secondary | ICD-10-CM | POA: Diagnosis not present

## 2022-01-17 DIAGNOSIS — Z79899 Other long term (current) drug therapy: Secondary | ICD-10-CM

## 2022-01-17 DIAGNOSIS — E039 Hypothyroidism, unspecified: Secondary | ICD-10-CM | POA: Diagnosis present

## 2022-01-17 DIAGNOSIS — Z8249 Family history of ischemic heart disease and other diseases of the circulatory system: Secondary | ICD-10-CM

## 2022-01-17 DIAGNOSIS — Z720 Tobacco use: Secondary | ICD-10-CM

## 2022-01-17 DIAGNOSIS — T380X5A Adverse effect of glucocorticoids and synthetic analogues, initial encounter: Secondary | ICD-10-CM | POA: Diagnosis present

## 2022-01-17 DIAGNOSIS — E782 Mixed hyperlipidemia: Secondary | ICD-10-CM | POA: Diagnosis not present

## 2022-01-17 DIAGNOSIS — J209 Acute bronchitis, unspecified: Secondary | ICD-10-CM

## 2022-01-17 DIAGNOSIS — K219 Gastro-esophageal reflux disease without esophagitis: Secondary | ICD-10-CM | POA: Diagnosis not present

## 2022-01-17 DIAGNOSIS — Z91013 Allergy to seafood: Secondary | ICD-10-CM

## 2022-01-17 LAB — CBC WITH DIFFERENTIAL/PLATELET
Abs Immature Granulocytes: 0.07 10*3/uL (ref 0.00–0.07)
Basophils Absolute: 0 10*3/uL (ref 0.0–0.1)
Basophils Relative: 0 %
Eosinophils Absolute: 0 10*3/uL (ref 0.0–0.5)
Eosinophils Relative: 0 %
HCT: 47.7 % — ABNORMAL HIGH (ref 36.0–46.0)
Hemoglobin: 15.6 g/dL — ABNORMAL HIGH (ref 12.0–15.0)
Immature Granulocytes: 1 %
Lymphocytes Relative: 6 %
Lymphs Abs: 0.8 10*3/uL (ref 0.7–4.0)
MCH: 30.6 pg (ref 26.0–34.0)
MCHC: 32.7 g/dL (ref 30.0–36.0)
MCV: 93.7 fL (ref 80.0–100.0)
Monocytes Absolute: 0.5 10*3/uL (ref 0.1–1.0)
Monocytes Relative: 4 %
Neutro Abs: 12.5 10*3/uL — ABNORMAL HIGH (ref 1.7–7.7)
Neutrophils Relative %: 89 %
Platelets: 280 10*3/uL (ref 150–400)
RBC: 5.09 MIL/uL (ref 3.87–5.11)
RDW: 14 % (ref 11.5–15.5)
WBC: 13.9 10*3/uL — ABNORMAL HIGH (ref 4.0–10.5)
nRBC: 0 % (ref 0.0–0.2)

## 2022-01-17 MED ORDER — ALBUTEROL SULFATE (2.5 MG/3ML) 0.083% IN NEBU
15.0000 mg/h | INHALATION_SOLUTION | Freq: Once | RESPIRATORY_TRACT | Status: AC
  Administered 2022-01-17: 15 mg/h via RESPIRATORY_TRACT
  Filled 2022-01-17: qty 18

## 2022-01-17 MED ORDER — IPRATROPIUM BROMIDE 0.02 % IN SOLN
0.5000 mg | Freq: Once | RESPIRATORY_TRACT | Status: AC
  Administered 2022-01-17: 0.5 mg via RESPIRATORY_TRACT
  Filled 2022-01-17: qty 2.5

## 2022-01-17 MED ORDER — DEXAMETHASONE SODIUM PHOSPHATE 10 MG/ML IJ SOLN
10.0000 mg | Freq: Once | INTRAMUSCULAR | Status: AC
  Administered 2022-01-18: 10 mg via INTRAVENOUS
  Filled 2022-01-17: qty 1

## 2022-01-17 NOTE — ED Triage Notes (Signed)
Pt c/o cough and sob since Thursday, she was seen for the same at urgent care Friday. Pt was 84% on room air when ems arrived.

## 2022-01-18 DIAGNOSIS — Z8719 Personal history of other diseases of the digestive system: Secondary | ICD-10-CM | POA: Diagnosis not present

## 2022-01-18 DIAGNOSIS — R739 Hyperglycemia, unspecified: Secondary | ICD-10-CM

## 2022-01-18 DIAGNOSIS — E860 Dehydration: Secondary | ICD-10-CM

## 2022-01-18 DIAGNOSIS — J301 Allergic rhinitis due to pollen: Secondary | ICD-10-CM | POA: Diagnosis present

## 2022-01-18 DIAGNOSIS — F419 Anxiety disorder, unspecified: Secondary | ICD-10-CM | POA: Diagnosis present

## 2022-01-18 DIAGNOSIS — I959 Hypotension, unspecified: Secondary | ICD-10-CM | POA: Diagnosis not present

## 2022-01-18 DIAGNOSIS — E039 Hypothyroidism, unspecified: Secondary | ICD-10-CM | POA: Diagnosis present

## 2022-01-18 DIAGNOSIS — D72829 Elevated white blood cell count, unspecified: Secondary | ICD-10-CM

## 2022-01-18 DIAGNOSIS — Z885 Allergy status to narcotic agent status: Secondary | ICD-10-CM | POA: Diagnosis not present

## 2022-01-18 DIAGNOSIS — Z79899 Other long term (current) drug therapy: Secondary | ICD-10-CM | POA: Diagnosis not present

## 2022-01-18 DIAGNOSIS — J9601 Acute respiratory failure with hypoxia: Secondary | ICD-10-CM | POA: Diagnosis present

## 2022-01-18 DIAGNOSIS — Z8249 Family history of ischemic heart disease and other diseases of the circulatory system: Secondary | ICD-10-CM | POA: Diagnosis not present

## 2022-01-18 DIAGNOSIS — J441 Chronic obstructive pulmonary disease with (acute) exacerbation: Secondary | ICD-10-CM

## 2022-01-18 DIAGNOSIS — F17210 Nicotine dependence, cigarettes, uncomplicated: Secondary | ICD-10-CM | POA: Diagnosis present

## 2022-01-18 DIAGNOSIS — Z91013 Allergy to seafood: Secondary | ICD-10-CM | POA: Diagnosis not present

## 2022-01-18 DIAGNOSIS — Z833 Family history of diabetes mellitus: Secondary | ICD-10-CM | POA: Diagnosis not present

## 2022-01-18 DIAGNOSIS — K219 Gastro-esophageal reflux disease without esophagitis: Secondary | ICD-10-CM

## 2022-01-18 DIAGNOSIS — R6510 Systemic inflammatory response syndrome (SIRS) of non-infectious origin without acute organ dysfunction: Secondary | ICD-10-CM | POA: Diagnosis present

## 2022-01-18 DIAGNOSIS — Z888 Allergy status to other drugs, medicaments and biological substances status: Secondary | ICD-10-CM | POA: Diagnosis not present

## 2022-01-18 DIAGNOSIS — E782 Mixed hyperlipidemia: Secondary | ICD-10-CM | POA: Diagnosis present

## 2022-01-18 DIAGNOSIS — Z7982 Long term (current) use of aspirin: Secondary | ICD-10-CM | POA: Diagnosis not present

## 2022-01-18 DIAGNOSIS — Z20822 Contact with and (suspected) exposure to covid-19: Secondary | ICD-10-CM | POA: Diagnosis present

## 2022-01-18 DIAGNOSIS — Z720 Tobacco use: Secondary | ICD-10-CM

## 2022-01-18 LAB — URINALYSIS, ROUTINE W REFLEX MICROSCOPIC
Bilirubin Urine: NEGATIVE
Glucose, UA: NEGATIVE mg/dL
Ketones, ur: NEGATIVE mg/dL
Leukocytes,Ua: NEGATIVE
Nitrite: NEGATIVE
Protein, ur: NEGATIVE mg/dL
Specific Gravity, Urine: 1.005 (ref 1.005–1.030)
pH: 5 (ref 5.0–8.0)

## 2022-01-18 LAB — GLUCOSE, CAPILLARY: Glucose-Capillary: 148 mg/dL — ABNORMAL HIGH (ref 70–99)

## 2022-01-18 LAB — D-DIMER, QUANTITATIVE: D-Dimer, Quant: <0.27 ug/mL-FEU (ref 0.00–0.50)

## 2022-01-18 LAB — TSH: TSH: 0.92 u[IU]/mL (ref 0.350–4.500)

## 2022-01-18 LAB — BASIC METABOLIC PANEL
Anion gap: 8 (ref 5–15)
BUN: 18 mg/dL (ref 8–23)
CO2: 30 mmol/L (ref 22–32)
Calcium: 8.7 mg/dL — ABNORMAL LOW (ref 8.9–10.3)
Chloride: 103 mmol/L (ref 98–111)
Creatinine, Ser: 0.96 mg/dL (ref 0.44–1.00)
GFR, Estimated: >60 mL/min (ref >60)
Glucose, Bld: 147 mg/dL — ABNORMAL HIGH (ref 70–99)
Potassium: 3.9 mmol/L (ref 3.5–5.1)
Sodium: 141 mmol/L (ref 135–145)

## 2022-01-18 LAB — APTT: aPTT: 30 seconds (ref 24–36)

## 2022-01-18 LAB — PROCALCITONIN: Procalcitonin: <0.1 ng/mL

## 2022-01-18 LAB — PHOSPHORUS: Phosphorus: 4.6 mg/dL (ref 2.5–4.6)

## 2022-01-18 LAB — MRSA NEXT GEN BY PCR, NASAL: MRSA by PCR Next Gen: NOT DETECTED

## 2022-01-18 LAB — SARS CORONAVIRUS 2 BY RT PCR: SARS Coronavirus 2 by RT PCR: NEGATIVE

## 2022-01-18 LAB — MAGNESIUM: Magnesium: 2 mg/dL (ref 1.7–2.4)

## 2022-01-18 LAB — CORTISOL: Cortisol, Plasma: 4.2 ug/dL

## 2022-01-18 MED ORDER — PANTOPRAZOLE SODIUM 40 MG PO TBEC: 40.0000 mg | DELAYED_RELEASE_TABLET | Freq: Two times a day (BID) | ORAL | Status: DC

## 2022-01-18 MED ORDER — AZITHROMYCIN 250 MG PO TABS
250.0000 mg | ORAL_TABLET | Freq: Every day | ORAL | Status: DC
  Administered 2022-01-19: 250 mg via ORAL
  Filled 2022-01-18: qty 1

## 2022-01-18 MED ORDER — PANTOPRAZOLE SODIUM 40 MG PO TBEC
40.0000 mg | DELAYED_RELEASE_TABLET | Freq: Two times a day (BID) | ORAL | Status: DC
  Administered 2022-01-18 – 2022-01-19 (×4): 40 mg via ORAL
  Filled 2022-01-18 (×4): qty 1

## 2022-01-18 MED ORDER — ACETAMINOPHEN 650 MG RE SUPP: 650.0000 mg | Freq: Four times a day (QID) | RECTAL | Status: DC | PRN

## 2022-01-18 MED ORDER — SODIUM CHLORIDE 0.9 % IV BOLUS
500.0000 mL | Freq: Once | INTRAVENOUS | Status: AC
  Administered 2022-01-18: 500 mL via INTRAVENOUS

## 2022-01-18 MED ORDER — ALPRAZOLAM 0.5 MG PO TABS
0.5000 mg | ORAL_TABLET | Freq: Every day | ORAL | Status: DC | PRN
  Administered 2022-01-19: 0.5 mg via ORAL
  Filled 2022-01-18: qty 1

## 2022-01-18 MED ORDER — IPRATROPIUM-ALBUTEROL 0.5-2.5 (3) MG/3ML IN SOLN
3.0000 mL | Freq: Once | RESPIRATORY_TRACT | Status: AC
  Administered 2022-01-18: 3 mL via RESPIRATORY_TRACT
  Filled 2022-01-18: qty 3

## 2022-01-18 MED ORDER — ENOXAPARIN SODIUM 40 MG/0.4ML IJ SOSY
40.0000 mg | PREFILLED_SYRINGE | INTRAMUSCULAR | Status: DC
  Administered 2022-01-18 – 2022-01-19 (×2): 40 mg via SUBCUTANEOUS
  Filled 2022-01-18 (×2): qty 0.4

## 2022-01-18 MED ORDER — ACETAMINOPHEN 325 MG PO TABS: 650.0000 mg | ORAL_TABLET | Freq: Four times a day (QID) | ORAL | Status: DC | PRN

## 2022-01-18 MED ORDER — METHYLPREDNISOLONE SODIUM SUCC 40 MG IJ SOLR
40.0000 mg | Freq: Two times a day (BID) | INTRAMUSCULAR | Status: DC
  Administered 2022-01-18 – 2022-01-19 (×3): 40 mg via INTRAVENOUS
  Filled 2022-01-18 (×3): qty 1

## 2022-01-18 MED ORDER — AZITHROMYCIN 250 MG PO TABS
500.0000 mg | ORAL_TABLET | Freq: Every day | ORAL | Status: AC
  Administered 2022-01-18: 500 mg via ORAL
  Filled 2022-01-18: qty 2

## 2022-01-18 MED ORDER — IPRATROPIUM-ALBUTEROL 0.5-2.5 (3) MG/3ML IN SOLN
3.0000 mL | Freq: Four times a day (QID) | RESPIRATORY_TRACT | Status: DC
  Administered 2022-01-18 – 2022-01-19 (×5): 3 mL via RESPIRATORY_TRACT
  Filled 2022-01-18 (×5): qty 3

## 2022-01-18 MED ORDER — IPRATROPIUM-ALBUTEROL 0.5-2.5 (3) MG/3ML IN SOLN: 3.0000 mL | RESPIRATORY_TRACT | Status: DC | PRN

## 2022-01-18 MED ORDER — DM-GUAIFENESIN ER 30-600 MG PO TB12
1.0000 | ORAL_TABLET | Freq: Two times a day (BID) | ORAL | Status: DC
  Administered 2022-01-18 – 2022-01-19 (×4): 1 via ORAL
  Filled 2022-01-18 (×4): qty 1

## 2022-01-18 MED ORDER — VITAMIN D 25 MCG (1000 UNIT) PO TABS
1000.0000 [IU] | ORAL_TABLET | Freq: Every day | ORAL | Status: DC
  Administered 2022-01-18 – 2022-01-19 (×2): 1000 [IU] via ORAL
  Filled 2022-01-18 (×2): qty 1

## 2022-01-18 MED ORDER — ASPIRIN 81 MG PO TBEC
81.0000 mg | DELAYED_RELEASE_TABLET | Freq: Every day | ORAL | Status: DC
  Administered 2022-01-18 – 2022-01-19 (×2): 81 mg via ORAL
  Filled 2022-01-18 (×2): qty 1

## 2022-01-18 MED ORDER — SODIUM CHLORIDE 0.9 % IV SOLN: INTRAVENOUS | Status: AC

## 2022-01-18 MED ORDER — GUAIFENESIN-DM 100-10 MG/5ML PO SYRP
5.0000 mL | ORAL_SOLUTION | ORAL | Status: DC | PRN
  Administered 2022-01-18: 5 mL via ORAL
  Filled 2022-01-18: qty 5

## 2022-01-18 NOTE — Progress Notes (Signed)
Lori Lopez is a 71 y.o. female with medical history significant of COPD, GERD, tobacco abuse who presents to the emergency department via EMS due to 4-day onset of shortness of breath, wheezing, chest congestion and nonproductive cough, she went to an urgent care on Friday (9/8) but still continued to wheeze and have chest congestion.  She was admitted with acute hypoxemic respiratory failure secondary to acute COPD exacerbation and has been started on breathing treatments as well as Solu-Medrol and azithromycin.  Patient has been admitted after midnight and has been seen and evaluated at bedside with no new acute events or concerns noted this morning.  Overnight she was noted to have some hypotension which required fluid bolus and transferred to stepdown unit for closer monitoring, her blood pressures have now remained stable after bolus and she may transfer back to telemetry.  She states that her symptoms are slowly improving.  Total care time: 20 minutes.

## 2022-01-18 NOTE — TOC Progression Note (Signed)
  Transition of Care [facility]) Screening Note   Patient Details  Name: Lori Lopez Date of Birth: 1950-05-19   Transition of Care [facility]) CM/SW Contact:    Shade Flood, LCSW Phone Number: 01/18/2022, 11:18 AM    Transition of Care Department [facility]) has reviewed patient and no TOC needs have been identified at this time. We will continue to monitor patient advancement through interdisciplinary progression rounds. If new patient transition needs arise, please place a TOC consult.

## 2022-01-18 NOTE — Progress Notes (Signed)
Progress note  HYPOTENSION  RN called due to patient being hypotensive with a BP of 87/53, IV fluid 500 mL was given without response.  Second IV fluid 500 mL will be started. Patient was positive for SIRS, but does not appear to be septic at this time and there was no obvious source of infection SARS coronavirus 2 was negative Chest x-ray showed no active disease Patient denies any burning sensation on urination or any irritative bladder symptoms Urinalysis will be obtained TSH, cortisol level will be obtained Procalcitonin will be checked with a goal to start antibiotics based on findings Patient will be changed from telemetry to stepdown at this time Patient may require temporal peripheral IV pressors if BP continues to stay within hypotensive range

## 2022-01-18 NOTE — ED Notes (Signed)
Dr. Josephine Cables made aware of pts BP trending down. Waiting for orders.

## 2022-01-18 NOTE — ED Notes (Signed)
Pt ambulated to restroom. O2 dropped to 79% on RA, HR 130-149. Pt placed back on 4L [place]. O2 95%

## 2022-01-18 NOTE — ED Provider Notes (Signed)
[facility] EMERGENCY DEPARTMENT Provider Note   CSN: [id number] Arrival date & time: 01/17/22  2248     History  Chief Complaint  Patient presents with   Shortness of Breath    Lori Lopez is a 71 y.o. female.  HPI     This is a 71 year old female who presents with shortness of breath and cough.  Patient reports worsening symptoms since Thursday.  She has not had any fever.  She reports nonproductive cough and increasing shortness of breath.  Of note, she is noted hypoxic on arrival and was placed on oxygen.  She is not normally on oxygen at home.  She does continue to smoke and believes she has a diagnosis of COPD.  She has been using her nebulizer at home with minimal relief.  She denies any chest pain or lower extremity swelling.  Home Medications Prior to Admission medications   Medication Sig Start Date End Date Taking? Authorizing Provider  ALPRAZolam Duanne Moron) 1 MG tablet Take 0.5 mg by mouth daily as needed for anxiety. 02/10/16   [provider]  aspirin EC 81 MG tablet Take 81 mg by mouth daily. Swallow whole.    [provider]  Cholecalciferol (VITAMIN D3) 1000 units CAPS Take 1,000 Units by mouth daily.    [provider]  doxycycline (VIBRAMYCIN) 100 MG capsule Take 1 capsule (100 mg total) by mouth 2 (two) times daily. 01/14/22   Hezzie Bump, NP  gabapentin (NEURONTIN) 300 MG capsule Take daily midday with lunch for shingles pain for 14 days Patient taking differently: Take 300 mg by mouth 2 (two) times daily. 07/26/20   Scot Jun, FNP  ipratropium-albuterol (DUONEB) 0.5-2.5 (3) MG/3ML SOLN Take 3 mLs by nebulization every 4 (four) hours as needed. 01/14/22   Hezzie Bump, NP  linaclotide [facility]) 72 MCG capsule Take 1 capsule (72 mcg total) by mouth daily as needed. 06/30/21   Mahala Menghini, PA-C  pantoprazole (PROTONIX) 40 MG tablet Take 40 mg by mouth 2 (two) times daily.    [provider]  predniSONE (DELTASONE)  20 MG tablet Take 1 tablet (20 mg total) by mouth 2 (two) times daily with a meal for 5 days. 01/14/22 01/19/22  Hezzie Bump, NP  terbinafine (LAMISIL) 1 % cream Apply 1 application topically daily as needed (athlete's foot).    [provider]      Allergies    Shrimp [shellfish allergy], Hydrocodone, and Lyrica [pregabalin]    Review of Systems   Review of Systems  Constitutional:  Negative for fever.  Respiratory:  Positive for cough and shortness of breath.   All other systems reviewed and are negative.   Physical Exam Updated Vital Signs BP (!) 101/47   Pulse (!) 106   Temp 97.7 F (36.5 C)   Resp 15   Ht 1.575 m ('5\' 2"'$ )   Wt 70 kg   SpO2 97%   BMI 28.23 kg/m  Physical Exam Vitals and nursing note reviewed.  Constitutional:      Appearance: She is well-developed.     Comments: Chronically ill-appearing but no acute distress  HENT:     Head: Normocephalic and atraumatic.  Eyes:     Pupils: Pupils are equal, round, and reactive to light.  Cardiovascular:     Rate and Rhythm: Normal rate and regular rhythm.     Heart sounds: Normal heart sounds.  Pulmonary:     Effort: Pulmonary effort is normal.  Breath sounds: Wheezing present.     Comments: Tight, wheezing noted in all lung fields, nasal cannula in place Abdominal:     General: Bowel sounds are normal.     Palpations: Abdomen is soft.  Musculoskeletal:     Cervical back: Neck supple.     Right lower leg: No edema.     Left lower leg: No edema.  Skin:    General: Skin is warm and dry.  Neurological:     Mental Status: She is alert and oriented to person, place, and time.  Psychiatric:        Mood and Affect: Mood normal.     ED Results / Procedures / Treatments   Labs (all labs ordered are listed, but only abnormal results are displayed) Labs Reviewed  CBC WITH DIFFERENTIAL/PLATELET - Abnormal; Notable for the following components:      Result Value   WBC 13.9 (*)    Hemoglobin 15.6 (*)     HCT 47.7 (*)    Neutro Abs 12.5 (*)    All other components within normal limits  BASIC METABOLIC PANEL - Abnormal; Notable for the following components:   Glucose, Bld 147 (*)    Calcium 8.7 (*)    All other components within normal limits  SARS CORONAVIRUS 2 BY RT PCR  D-DIMER, QUANTITATIVE    EKG EKG Interpretation  Date/Time:  Monday January 17 2022 22:55:51 EDT Ventricular Rate:  100 PR Interval:  149 QRS Duration: 90 QT Interval:  346 QTC Calculation: 447 R Axis:   79 Text Interpretation: Sinus tachycardia Confirmed by Thayer Jew [phone]) on 01/18/2022 12:38:47 AM  Radiology DG Chest Portable 1 View  Result Date: 01/17/2022 CLINICAL DATA:  Cough and dyspnea EXAM: PORTABLE CHEST 1 VIEW COMPARISON:  CT chest 10/07/2021 and radiographs 06/02/2018 FINDINGS: Stable cardiomediastinal silhouette. Aortic atherosclerotic calcification. No focal consolidation, pleural effusion, or pneumothorax. Mild interstitial coarsening no acute osseous abnormality. Cervical spine fusion hardware. IMPRESSION: No active disease. Electronically Signed   By: Placido Sou M.D.   On: 01/17/2022 23:27    Procedures .Critical Care  Performed by: Merryl Hacker, MD Authorized by: Merryl Hacker, MD   Critical care provider statement:    Critical care time (minutes):  35   Critical care was necessary to treat or prevent imminent or life-threatening deterioration of the following conditions:  Respiratory failure   Critical care was time spent personally by me on the following activities:  Development of treatment plan with patient or surrogate, discussions with consultants, evaluation of patient's response to treatment, examination of patient, ordering and review of laboratory studies, ordering and review of radiographic studies, ordering and performing treatments and interventions, pulse oximetry, re-evaluation of patient's condition and review of old charts     Medications Ordered  in ED Medications  albuterol (PROVENTIL) (2.5 MG/3ML) 0.083% nebulizer solution (15 mg/hr Nebulization Given 01/17/22 2356)  ipratropium (ATROVENT) nebulizer solution 0.5 mg (0.5 mg Nebulization Given 01/17/22 2356)  dexamethasone (DECADRON) injection 10 mg (10 mg Intravenous Given 01/18/22 0003)  ipratropium-albuterol (DUONEB) 0.5-2.5 (3) MG/3ML nebulizer solution 3 mL (3 mLs Nebulization Given 01/18/22 0230)    ED Course/ Medical Decision Making/ A&P Clinical Course as of 01/18/22 0245  Tue Jan 18, 2022  0244 Patient continues to have coarse breath sounds.  Persistent O2 requirement.  Per nursing got up to the bathroom and dropped to the 70s. [CH]    Clinical Course User Index [CH] Kyuss Hale, Barbette Hair, MD  Medical Decision Making Amount and/or Complexity of Data Reviewed Labs: ordered. Radiology: ordered.  Risk Prescription drug management. Decision regarding hospitalization.   This patient presents to the ED for concern of shortness of breath, this involves an extensive number of treatment options, and is a complaint that carries with it a high risk of complications and morbidity.  I considered the following differential and admission for this acute, potentially life threatening condition.  The differential diagnosis includes pneumonia, COPD exacerbation, COVID, influenza, PE  MDM:    This is a 71 year old female with history of COPD who presents with shortness of breath.  She is overall nontoxic.  She does have an O2 requirement which is new for her.  She has coarse tight breath sounds with wheezing.  She was given a continuous DuoNeb and Decadron.  Highly favor COPD.  Chest x-ray is without evidence of obvious pneumonia.  She does have a mild leukocytosis but she is afebrile.  Patient had some improvement with a continuous DuoNeb but maintains an oxygen requirement and precipitously dropped her oxygen saturations with ambulation.  Have repeated a DuoNeb.  EKG  without acute ischemia or arrhythmia.  D-dimer is negative effectively ruling out PE.  Given her O2 requirement, she will require hospitalization for ongoing nebulization and O2 requirement.  We will hold off on antibiotics at this time as clinically I have lower suspicion for pneumonia.  (Labs, imaging, consults)  Labs: I Ordered, and personally interpreted labs.  The pertinent results include: CBC, BMP, COVID, D-dimer  Imaging Studies ordered: I ordered imaging studies including x-ray I independently visualized and interpreted imaging. I agree with the radiologist interpretation  Additional history obtained from husband.  External records from outside source obtained and reviewed including prior evaluations  Cardiac Monitoring: The patient was maintained on a cardiac monitor.  I personally viewed and interpreted the cardiac monitored which showed an underlying rhythm of: Sinus tachycardia  Reevaluation: After the interventions noted above, I reevaluated the patient and found that they have :improved  Social Determinants of Health: Lives independently, smoking  Disposition: Admission  Co morbidities that complicate the patient evaluation  Past Medical History:  Diagnosis Date   Adjustment disorder with depressed mood 09/08/2009   Allergic rhinitis due to pollen 09/08/2009   Anxiety    Cigarette nicotine dependence, uncomplicated 09/24/8414   COPD (chronic obstructive pulmonary disease) (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   Hypothyroidism    Mixed hyperlipidemia 05/15/2013   Obesity due to excess calories 05/15/2013   Tinea unguium 05/15/2013   Vitamin D deficiency 12/28/2009     Medicines Meds ordered this encounter  Medications   albuterol (PROVENTIL) (2.5 MG/3ML) 0.083% nebulizer solution   ipratropium (ATROVENT) nebulizer solution 0.5 mg   dexamethasone (DECADRON) injection  10 mg   ipratropium-albuterol (DUONEB) 0.5-2.5 (3) MG/3ML nebulizer solution 3 mL    I have reviewed the patients home medicines and have made adjustments as needed  Problem List / ED Course: Problem List Items Addressed This Visit   None Visit Diagnoses     Hypoxia    -  Primary   COPD exacerbation ([place])       Relevant Medications   albuterol (PROVENTIL) (2.5 MG/3ML) 0.083% nebulizer solution (Completed)   ipratropium (ATROVENT) nebulizer solution 0.5 mg (Completed)   dexamethasone (DECADRON) injection 10 mg (Completed)   ipratropium-albuterol (DUONEB) 0.5-2.5 (3) MG/3ML nebulizer solution 3 mL (Completed)  Final Clinical Impression(s) / ED Diagnoses Final diagnoses:  Hypoxia  COPD exacerbation [facility])    Rx / DC Orders ED Discharge Orders     None         Corynn Solberg, Barbette Hair, MD 01/18/22 [phone]

## 2022-01-18 NOTE — Progress Notes (Addendum)
Patient Lori Lopez is done.  HR went up to 112 bpm.  Patient stated she felt jittery, and we had talked before treatment that she may feel jittery.  BS Rhonchi, but do not hear as much wheezing as before treatment.

## 2022-01-18 NOTE — H&P (Addendum)
History and Physical    Patient: Lori Lopez CVE:[id number] DOB: 22-Oct-1950 DOA: 01/17/2022 DOS: the patient was seen and examined on 01/18/2022 PCP: Celene Squibb, MD  Patient coming from: Home  Chief Complaint:  Chief Complaint  Patient presents with   Shortness of Breath   HPI: Lori Lopez is a 71 y.o. female with medical history significant of COPD, GERD, tobacco abuse who presents to the emergency department via EMS due to 4-day onset of shortness of breath, wheezing, chest congestion and nonproductive cough, she went to an urgent care on Friday (9/8) but still continued to wheeze and have chest congestion.  She used home nebulizer with only minimal relief.  She continues to smoke cigarettes.  She denies fever, chills, chest pain, headache, nausea, vomiting.  Patient was never intubated due to COPD exacerbation.  ED Course:  In the emergency department, she was tachycardic, and was hypoxic on arrival (84% on room air), she was placed on supplemental oxygen with improvement in O2 sats to 96% on supplemental oxygen via [place] at 3 LPM, other vital signs were within normal range.  Work-up in the ED showed leukocytosis, H/H-15.6/47.7, D-dimer < 0.27, BMP was normal except for glucose of 147.  SARS coronavirus 2 was negative Chest x-ray showed no active disease Breathing treatment was provided, Decadron 10 mg x 1 was given.  Hospitalist was asked to admit patient for further evaluation and management.  Review of Systems: Review of systems as noted in the HPI. All other systems reviewed and are negative.   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 Oneida Alar: normal colon, small internal hemorrhoids.    COLONOSCOPY N/A 04/08/2016   Dr. Oneida Alar: examined portion of ileum normal. two 5-6 mm polyps in descending colon and transverse colon, three 2-4 mm polyps in rectum, (all hyperplastic), internal and external hemorrhoids. Repeat in 10 years   COLONOSCOPY WITH PROPOFOL N/A 09/22/2020   Procedure: COLONOSCOPY WITH PROPOFOL;  Surgeon: Eloise Harman, DO;  Location: [facility];  Service: Endoscopy;  Laterality: N/A;  2:45pm   ESOPHAGOGASTRODUODENOSCOPY (EGD) WITH PROPOFOL N/A 11/07/2017   Dr. Oneida Alar: Normal esophagus status post dilation.  Mild gastritis with benign biopsies   POLYPECTOMY  09/22/2020   Procedure: POLYPECTOMY;  Surgeon: Eloise Harman, DO;  Location: [facility];  Service: Endoscopy;;   SAVORY DILATION N/A 11/07/2017   Procedure: SAVORY DILATION;  Surgeon: Danie Binder, MD;  Location: [facility];  Service: Endoscopy;  Laterality: N/A;    Social History:  reports that she has been smoking. She has never used smokeless tobacco. She reports that she does not drink alcohol and does not use drugs.   Allergies  Allergen Reactions  Shrimp [Shellfish Allergy] Anaphylaxis   Hydrocodone Hives and Itching   Lyrica [Pregabalin] Other (See Comments)    unknown    Family History  Problem Relation Age of Onset   Diabetes Mother    CAD Mother    Heart disease Mother    CAD Father    Colon cancer Neg Hx    Colon polyps Neg Hx      Prior to Admission medications   Medication Sig Start Date End Date  Taking? Authorizing Provider  ALPRAZolam Duanne Moron) 1 MG tablet Take 0.5 mg by mouth daily as needed for anxiety. 02/10/16   [provider]  aspirin EC 81 MG tablet Take 81 mg by mouth daily. Swallow whole.    [provider]  Cholecalciferol (VITAMIN D3) 1000 units CAPS Take 1,000 Units by mouth daily.    [provider]  doxycycline (VIBRAMYCIN) 100 MG capsule Take 1 capsule (100 mg total) by mouth 2 (two) times daily. 01/14/22   Hezzie Bump, NP  gabapentin (NEURONTIN) 300 MG capsule Take daily midday with lunch for shingles pain for 14 days Patient taking differently: Take 300 mg by mouth 2 (two) times daily. 07/26/20   Scot Jun, FNP  ipratropium-albuterol (DUONEB) 0.5-2.5 (3) MG/3ML SOLN Take 3 mLs by nebulization every 4 (four) hours as needed. 01/14/22   Hezzie Bump, NP  linaclotide [facility]) 72 MCG capsule Take 1 capsule (72 mcg total) by mouth daily as needed. 06/30/21   Mahala Menghini, PA-C  pantoprazole (PROTONIX) 40 MG tablet Take 40 mg by mouth 2 (two) times daily.    [provider]  predniSONE (DELTASONE) 20 MG tablet Take 1 tablet (20 mg total) by mouth 2 (two) times daily with a meal for 5 days. 01/14/22 01/19/22  Hezzie Bump, NP  terbinafine (LAMISIL) 1 % cream Apply 1 application topically daily as needed (athlete's foot).    [provider]    Physical Exam: BP (!) 89/41   Pulse (!) 103   Temp 97.7 F (36.5 C)   Resp 12   Ht '5\' 2"'$  (1.575 m)   Wt 70 kg   SpO2 100%   BMI 28.23 kg/m   General: 71 y.o. year-old female ill appearing, but in no acute distress.  Alert and oriented x3. HEENT: NCAT, EOMI, dry mucous membrane Neck: Supple, trachea medial Cardiovascular: Tachycardia.  Regular rate and rhythm with no rubs or gallops.  No thyromegaly or JVD noted.  No lower extremity edema. 2/4 pulses in all 4 extremities. Respiratory: Diffuse wheezing on auscultation.  No rales  Abdomen: Soft, nontender nondistended with  normal bowel sounds x4 quadrants. Muskuloskeletal: No cyanosis, clubbing or edema noted bilaterally Neuro: CN II-XII intact, strength 5/5 x 4, sensation, reflexes intact Skin: No ulcerative lesions noted or rashes Psychiatry: Judgement and insight appear normal. Mood is appropriate for condition and setting          Labs on Admission:  Basic Metabolic Panel: Recent Labs  Lab 01/17/22 2337  NA 141  K 3.9  CL 103  CO2 30  GLUCOSE 147*  BUN 18  CREATININE 0.96  CALCIUM 8.7*   Liver Function Tests: No results for input(s): "AST", "ALT", "ALKPHOS", "BILITOT", "PROT", "ALBUMIN" in the last 168 hours. No results for input(s): "LIPASE", "AMYLASE" in the last 168 hours. No results for input(s): "AMMONIA" in the last 168 hours. CBC: Recent Labs  Lab 01/17/22 2337  WBC 13.9*  NEUTROABS 12.5*  HGB 15.6*  HCT 47.7*  MCV 93.7  PLT 280   Cardiac Enzymes: No results for input(s): "CKTOTAL", "CKMB", "CKMBINDEX", "TROPONINI" in the last 168 hours.  BNP (last 3 results) No results for input(s): "BNP" in the last 8760 hours.  ProBNP (last 3 results) No results for input(s): "PROBNP" in the last 8760 hours.  CBG: No results for input(s): "GLUCAP" in the last 168 hours.  Radiological Exams on Admission: DG Chest Portable 1 View  Result Date: 01/17/2022 CLINICAL DATA:  Cough and dyspnea EXAM: PORTABLE CHEST 1 VIEW COMPARISON:  CT chest 10/07/2021 and radiographs 06/02/2018 FINDINGS: Stable cardiomediastinal silhouette. Aortic atherosclerotic calcification. No focal consolidation, pleural effusion, or pneumothorax. Mild interstitial coarsening no acute osseous abnormality. Cervical spine fusion hardware. IMPRESSION: No active disease. Electronically Signed   By: Placido Sou M.D.   On: 01/17/2022 23:27    EKG: I independently viewed the EKG done and my findings are as followed: Sinus tachycardia at rate of 100 bpm  Assessment/Plan Present on Admission:  Acute exacerbation of  chronic obstructive pulmonary disease (COPD) (HCC)  GERD (gastroesophageal reflux disease)  Principal Problem:   Acute exacerbation of chronic obstructive pulmonary disease (COPD) (HCC) Active Problems:   GERD (gastroesophageal reflux disease)   Hyperglycemia   Leukocytosis   Tobacco abuse   SIRS (systemic inflammatory response syndrome) (HCC)  Acute respiratory failure with hypoxia due to acute exacerbation of COPD Continue duo nebs, Mucinex, Solu-Medrol, azithromycin. Continue Protonix to prevent steroid-induced ulcer Continue incentive spirometry and flutter valve Continue supplemental oxygen to maintain O2 sat > 94% with plan to wean patient off oxygen as tolerated (patient does not use oxygen at baseline)  SIRS in the setting of above Patient was tachycardic and presents with leukocytosis No obvious sign of infectious process at this time SARS coronavirus 2 was negative Chest x-ray showed no active disease Procalcitonin will be checked to rule out any infectious process  Dehydration IV hydration will be provided  Hyperglycemia possibly reactive CBG 147, she appears to have been on prednisone per home med rec Continue to monitor blood glucose level  Leukocytosis possibly reactive WBC 13.9, she appears to have been on prednisone per home med rec  GERD Continue Protonix  Tobacco abuse Patient was counseled on tobacco abuse cessation  DVT prophylaxis: Lovenox  Code Status: Full code  Consults: None  Family Communication: None at bedside  Severity of Illness: The appropriate patient status for this patient is OBSERVATION. Observation status is judged to be reasonable and necessary in order to provide the required intensity of service to ensure the patient's safety. The patient's presenting symptoms, physical exam findings, and initial radiographic and laboratory data in the context of their medical condition is felt to place them at decreased risk for further  clinical deterioration. Furthermore, it is anticipated that the patient will be medically stable for discharge from the hospital within 2 midnights of admission.   Author: Bernadette Hoit, DO 01/18/2022 6:01 AM  For on call review [email or web address].

## 2022-01-19 DIAGNOSIS — K219 Gastro-esophageal reflux disease without esophagitis: Secondary | ICD-10-CM | POA: Diagnosis not present

## 2022-01-19 DIAGNOSIS — J441 Chronic obstructive pulmonary disease with (acute) exacerbation: Secondary | ICD-10-CM | POA: Diagnosis not present

## 2022-01-19 DIAGNOSIS — R739 Hyperglycemia, unspecified: Secondary | ICD-10-CM | POA: Diagnosis not present

## 2022-01-19 DIAGNOSIS — D72829 Elevated white blood cell count, unspecified: Secondary | ICD-10-CM | POA: Diagnosis not present

## 2022-01-19 LAB — COMPREHENSIVE METABOLIC PANEL
ALT: 36 U/L (ref 0–44)
AST: 29 U/L (ref 15–41)
Albumin: 3.4 g/dL — ABNORMAL LOW (ref 3.5–5.0)
Alkaline Phosphatase: 48 U/L (ref 38–126)
Anion gap: 4 — ABNORMAL LOW (ref 5–15)
BUN: 23 mg/dL (ref 8–23)
CO2: 32 mmol/L (ref 22–32)
Calcium: 8.7 mg/dL — ABNORMAL LOW (ref 8.9–10.3)
Chloride: 103 mmol/L (ref 98–111)
Creatinine, Ser: 0.87 mg/dL (ref 0.44–1.00)
GFR, Estimated: >60 mL/min (ref >60)
Glucose, Bld: 100 mg/dL — ABNORMAL HIGH (ref 70–99)
Potassium: 4.1 mmol/L (ref 3.5–5.1)
Sodium: 139 mmol/L (ref 135–145)
Total Bilirubin: 0.8 mg/dL (ref 0.3–1.2)
Total Protein: 6.2 g/dL — ABNORMAL LOW (ref 6.5–8.1)

## 2022-01-19 LAB — CBC
HCT: 44.9 % (ref 36.0–46.0)
Hemoglobin: 14.6 g/dL (ref 12.0–15.0)
MCH: 30.8 pg (ref 26.0–34.0)
MCHC: 32.5 g/dL (ref 30.0–36.0)
MCV: 94.7 fL (ref 80.0–100.0)
Platelets: 274 10*3/uL (ref 150–400)
RBC: 4.74 MIL/uL (ref 3.87–5.11)
RDW: 14.4 % (ref 11.5–15.5)
WBC: 15.3 10*3/uL — ABNORMAL HIGH (ref 4.0–10.5)
nRBC: 0 % (ref 0.0–0.2)

## 2022-01-19 LAB — HIV ANTIBODY (ROUTINE TESTING W REFLEX): HIV Screen 4th Generation wRfx: NONREACTIVE

## 2022-01-19 LAB — MAGNESIUM: Magnesium: 2.3 mg/dL (ref 1.7–2.4)

## 2022-01-19 MED ORDER — DOXYCYCLINE HYCLATE 100 MG PO CAPS: 100.0000 mg | ORAL_CAPSULE | Freq: Two times a day (BID) | ORAL | 0 refills | Status: AC

## 2022-01-19 MED ORDER — IPRATROPIUM-ALBUTEROL 0.5-2.5 (3) MG/3ML IN SOLN: 3.0000 mL | RESPIRATORY_TRACT | 1 refills | Status: AC | PRN

## 2022-01-19 MED ORDER — GUAIFENESIN-DM 100-10 MG/5ML PO SYRP: 5.0000 mL | ORAL_SOLUTION | ORAL | 0 refills | Status: DC | PRN

## 2022-01-19 MED ORDER — PREDNISONE 20 MG PO TABS: ORAL_TABLET | ORAL | 0 refills | Status: DC

## 2022-01-19 MED ORDER — GABAPENTIN 300 MG PO CAPS: 300.0000 mg | ORAL_CAPSULE | Freq: Two times a day (BID) | ORAL | Status: DC

## 2022-01-19 NOTE — Discharge Summary (Signed)
Physician Discharge Summary  Lori Lopez YNW:[id number] DOB: 10/13/1950 DOA: 01/17/2022  PCP: Celene Squibb, MD  Admit date: 01/17/2022 Discharge date: 01/19/2022  Admitted From:  HOME  Disposition: HOME   Recommendations for Outpatient Follow-up:  Follow up with PCP in 1 weeks  Discharge Condition: STABLE   CODE STATUS: FULL DIET: resume prior home diet    Brief Hospitalization Summary: Please see all hospital notes, images, labs for full details of the hospitalization.  ADMISSION HPI: 71 y.o. female with medical history significant of COPD, GERD, tobacco abuse who presents to the emergency department via EMS due to 4-day onset of shortness of breath, wheezing, chest congestion and nonproductive cough, she went to an urgent care on Friday (9/8) but still continued to wheeze and have chest congestion.  She used home nebulizer with only minimal relief.  She continues to smoke cigarettes.  She denies fever, chills, chest pain, headache, nausea, vomiting.  Patient was never intubated due to COPD exacerbation.   ED Course:  In the emergency department, she was tachycardic, and was hypoxic on arrival (84% on room air), she was placed on supplemental oxygen with improvement in O2 sats to 96% on supplemental oxygen via [place] at 3 LPM, other vital signs were within normal range.  Work-up in the ED showed leukocytosis, H/H-15.6/47.7, D-dimer < 0.27, BMP was normal except for glucose of 147.  SARS coronavirus 2 was negative.  Chest x-ray showed no active disease.  Breathing treatment was provided, Decadron 10 mg x 1 was given.  Hospitalist was asked to admit patient for further evaluation and management.  HOSPITAL COURSE BY PROBLEM    Acute respiratory failure with hypoxia due to acute exacerbation of COPD Pt responded well to scheduled duo nebs, Mucinex, Solu-Medrol, azithromycin. Continue Protonix to prevent steroid-induced ulcer Continue incentive spirometry and flutter valve Continue  supplemental oxygen to maintain O2 sat > 94% with plan to wean patient off oxygen as tolerated (patient does not use oxygen at baseline) Pt feeling better, DC home today, follow up with PCP in 1 week Pt will discharge on prednisone taper, nebulizers, doxycycline oral tabs  Pt will discharge on home oxygen arranged prior to discharge 2L/min    SIRS in the setting of above Resolved.  Sepsis ruled out.    Dehydration IV hydration was provided with good results    Hyperglycemia possibly reactive from steroids CBG 147, she appears to have been on prednisone per home med rec CBG (last 3)  Recent Labs    01/18/22 0647  GLUCAP 148*     Leukocytosis possibly reactive from prednisone (leukemoid reaction) WBC 13.9, she appears to have been on prednisone per home med rec   GERD Continue Protonix   Tobacco abuse Patient was counseled on tobacco abuse cessation   DVT prophylaxis: Lovenox    Discharge Diagnoses:  Principal Problem:   Acute exacerbation of chronic obstructive pulmonary disease (COPD) ([place]) Active Problems:   GERD (gastroesophageal reflux disease)   Hyperglycemia   Leukocytosis   Tobacco abuse   SIRS (systemic inflammatory response syndrome) ([place])   Discharge Instructions:  Allergies as of 01/19/2022       Reactions   Shrimp [shellfish Allergy] Anaphylaxis   Hydrocodone Hives, Itching   Lyrica [pregabalin] Other (See Comments)   unknown        Medication List     TAKE these medications    albuterol 108 (90 Base) MCG/ACT inhaler Commonly known as: VENTOLIN HFA 2 puffs every 4 (four) hours as  needed for shortness of breath or wheezing.   ALPRAZolam 1 MG tablet Commonly known as: XANAX Take 0.5 mg by mouth daily as needed for anxiety.   aspirin EC 81 MG tablet Take 81 mg by mouth daily. Swallow whole.   doxycycline 100 MG capsule Commonly known as: VIBRAMYCIN Take 1 capsule (100 mg total) by mouth 2 (two) times daily for 3 days.   gabapentin 300  MG capsule Commonly known as: NEURONTIN Take 1 capsule (300 mg total) by mouth 2 (two) times daily.   guaiFENesin-dextromethorphan 100-10 MG/5ML syrup Commonly known as: ROBITUSSIN DM Take 5 mLs by mouth every 4 (four) hours as needed for cough.   ipratropium-albuterol 0.5-2.5 (3) MG/3ML Soln Commonly known as: DUONEB Take 3 mLs by nebulization every 4 (four) hours as needed.   linaclotide 72 MCG capsule Commonly known as: Linzess Take 1 capsule (72 mcg total) by mouth daily as needed.   pantoprazole 40 MG tablet Commonly known as: PROTONIX Take 40 mg by mouth 2 (two) times daily.   predniSONE 20 MG tablet Commonly known as: DELTASONE Take 3 PO QAM x3days, 2 PO QAM x3days, 1 PO QAM x3days Start taking on: January 20, 2022 What changed:  how much to take how to take this when to take this additional instructions   terbinafine 1 % cream Commonly known as: LAMISIL Apply 1 application topically daily as needed (athlete's foot).   Vitamin D3 25 MCG (1000 UT) Caps Take 1,000 Units by mouth daily.               Durable Medical Equipment  (From admission, onward)           Start     Ordered   01/19/22 1044  For home use only DME oxygen  Once       Question Answer Comment  Length of Need Lifetime   Mode or (Route) Nasal cannula   Liters per Minute 2   Frequency Continuous (stationary and portable oxygen unit needed)   Oxygen conserving device Yes   Oxygen delivery system Gas      01/19/22 1044            Follow-up Information     Celene Squibb, MD. Schedule an appointment as soon as possible for a visit in 1 week(s).   Specialty: Internal Medicine Why: Hospital Follow Up Contact information: [address] [facility] [id number] [phone]                Allergies  Allergen Reactions   Shrimp [Shellfish Allergy] Anaphylaxis   Hydrocodone Hives and Itching   Lyrica [Pregabalin] Other (See Comments)    unknown   Allergies as of  01/19/2022       Reactions   Shrimp [shellfish Allergy] Anaphylaxis   Hydrocodone Hives, Itching   Lyrica [pregabalin] Other (See Comments)   unknown        Medication List     TAKE these medications    albuterol 108 (90 Base) MCG/ACT inhaler Commonly known as: VENTOLIN HFA 2 puffs every 4 (four) hours as needed for shortness of breath or wheezing.   ALPRAZolam 1 MG tablet Commonly known as: XANAX Take 0.5 mg by mouth daily as needed for anxiety.   aspirin EC 81 MG tablet Take 81 mg by mouth daily. Swallow whole.   doxycycline 100 MG capsule Commonly known as: VIBRAMYCIN Take 1 capsule (100 mg total) by mouth 2 (two) times daily for 3 days.   gabapentin  300 MG capsule Commonly known as: NEURONTIN Take 1 capsule (300 mg total) by mouth 2 (two) times daily.   guaiFENesin-dextromethorphan 100-10 MG/5ML syrup Commonly known as: ROBITUSSIN DM Take 5 mLs by mouth every 4 (four) hours as needed for cough.   ipratropium-albuterol 0.5-2.5 (3) MG/3ML Soln Commonly known as: DUONEB Take 3 mLs by nebulization every 4 (four) hours as needed.   linaclotide 72 MCG capsule Commonly known as: Linzess Take 1 capsule (72 mcg total) by mouth daily as needed.   pantoprazole 40 MG tablet Commonly known as: PROTONIX Take 40 mg by mouth 2 (two) times daily.   predniSONE 20 MG tablet Commonly known as: DELTASONE Take 3 PO QAM x3days, 2 PO QAM x3days, 1 PO QAM x3days Start taking on: January 20, 2022 What changed:  how much to take how to take this when to take this additional instructions   terbinafine 1 % cream Commonly known as: LAMISIL Apply 1 application topically daily as needed (athlete's foot).   Vitamin D3 25 MCG (1000 UT) Caps Take 1,000 Units by mouth daily.               Durable Medical Equipment  (From admission, onward)           Start     Ordered   01/19/22 1044  For home use only DME oxygen  Once       Question Answer Comment  Length of  Need Lifetime   Mode or (Route) Nasal cannula   Liters per Minute 2   Frequency Continuous (stationary and portable oxygen unit needed)   Oxygen conserving device Yes   Oxygen delivery system Gas      01/19/22 1044            Procedures/Studies: DG Chest Portable 1 View  Result Date: 01/17/2022 CLINICAL DATA:  Cough and dyspnea EXAM: PORTABLE CHEST 1 VIEW COMPARISON:  CT chest 10/07/2021 and radiographs 06/02/2018 FINDINGS: Stable cardiomediastinal silhouette. Aortic atherosclerotic calcification. No focal consolidation, pleural effusion, or pneumothorax. Mild interstitial coarsening no acute osseous abnormality. Cervical spine fusion hardware. IMPRESSION: No active disease. Electronically Signed   By: Placido Sou M.D.   On: 01/17/2022 23:27     Subjective: Pt reports that she is going home today regardless and will sign out AMA if not discharged, she reports she is breathing much better.   Discharge Exam: Vitals:   01/19/22 0704 01/19/22 0800  BP:  129/63  Pulse:  94  Resp:  20  Temp:  98.1 F (36.7 C)  SpO2: (!) 89% 93%   Vitals:   01/19/22 0205 01/19/22 0449 01/19/22 0704 01/19/22 0800  BP:  (!) 136/57  129/63  Pulse:  85  94  Resp:  18  20  Temp:  97.6 F (36.4 C)  98.1 F (36.7 C)  TempSrc:  Oral  Oral  SpO2: 92% (!) 88% (!) 89% 93%  Weight:      Height:       General: Pt is alert, awake, not in acute distress Cardiovascular: normal S1/S2 +, no rubs, no gallops Respiratory: CTA bilaterally, rare expiratory wheezing, no rhonchi Abdominal: Soft, NT, ND, bowel sounds + Extremities: no edema, no cyanosis   The results of significant diagnostics from this hospitalization (including imaging, microbiology, ancillary and laboratory) are listed below for reference.     Microbiology: Recent Results (from the past 240 hour(s))  SARS Coronavirus 2 by RT PCR (hospital order, performed in [facility] hospital lab) *cepheid single result test*  Anterior Nasal Swab      Status: None   Collection Time: 01/18/22 12:07 AM   Specimen: Anterior Nasal Swab  Result Value Ref Range Status   SARS Coronavirus 2 by RT PCR NEGATIVE NEGATIVE Final    Comment: (NOTE) SARS-CoV-2 target nucleic acids are NOT DETECTED.  The SARS-CoV-2 RNA is generally detectable in upper and lower respiratory specimens during the acute phase of infection. The lowest concentration of SARS-CoV-2 viral copies this assay can detect is 250 copies / mL. A negative result does not preclude SARS-CoV-2 infection and should not be used as the sole basis for treatment or other patient management decisions.  A negative result may occur with improper specimen collection / handling, submission of specimen other than nasopharyngeal swab, presence of viral mutation(s) within the areas targeted by this assay, and inadequate number of viral copies (<250 copies / mL). A negative result must be combined with clinical observations, patient history, and epidemiological information.  Fact Sheet for Patients:   https://www.patel.info/  Fact Sheet for Healthcare Providers: https://hall.com/  This test is not yet approved or  cleared by the [place] FDA and has been authorized for detection and/or diagnosis of SARS-CoV-2 by FDA under an Emergency Use Authorization (EUA).  This EUA will remain in effect (meaning this test can be used) for the duration of the COVID-19 declaration under Section 564(b)(1) of the Act, 21 U.S.C. section 360bbb-3(b)(1), unless the authorization is terminated or revoked sooner.  Performed at [facility], [address], [place], [place] [address]   MRSA Next Gen by PCR, Nasal     Status: None   Collection Time: 01/18/22  8:33 AM   Specimen: Nasal Mucosa; Nasal Swab  Result Value Ref Range Status   MRSA by PCR Next Gen NOT DETECTED NOT DETECTED Final    Comment: (NOTE) The GeneXpert MRSA Assay (FDA approved for NASAL specimens  only), is one component of a comprehensive MRSA colonization surveillance program. It is not intended to diagnose MRSA infection nor to guide or monitor treatment for MRSA infections. Test performance is not FDA approved in patients less than 52 years old. Performed at [facility], [address], [place], [place] [address]      Labs: BNP (last 3 results) No results for input(s): "BNP" in the last 8760 hours. Basic Metabolic Panel: Recent Labs  Lab 01/17/22 2337 01/18/22 0617 01/19/22 0358  NA 141  --  139  K 3.9  --  4.1  CL 103  --  103  CO2 30  --  32  GLUCOSE 147*  --  100*  BUN 18  --  23  CREATININE 0.96  --  0.87  CALCIUM 8.7*  --  8.7*  MG  --  2.0 2.3  PHOS  --  4.6  --    Liver Function Tests: Recent Labs  Lab 01/19/22 0358  AST 29  ALT 36  ALKPHOS 48  BILITOT 0.8  PROT 6.2*  ALBUMIN 3.4*   No results for input(s): "LIPASE", "AMYLASE" in the last 168 hours. No results for input(s): "AMMONIA" in the last 168 hours. CBC: Recent Labs  Lab 01/17/22 2337 01/19/22 0358  WBC 13.9* 15.3*  NEUTROABS 12.5*  --   HGB 15.6* 14.6  HCT 47.7* 44.9  MCV 93.7 94.7  PLT 280 274   Cardiac Enzymes: No results for input(s): "CKTOTAL", "CKMB", "CKMBINDEX", "TROPONINI" in the last 168 hours. BNP: Invalid input(s): "POCBNP" CBG: Recent Labs  Lab 01/18/22 0647  GLUCAP 148*  D-Dimer Recent Labs    01/17/22 2332  DDIMER <0.27   Hgb A1c No results for input(s): "HGBA1C" in the last 72 hours. Lipid Profile No results for input(s): "CHOL", "HDL", "LDLCALC", "TRIG", "CHOLHDL", "LDLDIRECT" in the last 72 hours. Thyroid function studies Recent Labs    01/18/22 0617  TSH 0.920   Anemia work up No results for input(s): "VITAMINB12", "FOLATE", "FERRITIN", "TIBC", "IRON", "RETICCTPCT" in the last 72 hours. Urinalysis    Component Value Date/Time   COLORURINE STRAW (A) 01/18/2022 0614   APPEARANCEUR CLEAR 01/18/2022 0614   LABSPEC 1.005 01/18/2022 0614    PHURINE 5.0 01/18/2022 0614   GLUCOSEU NEGATIVE 01/18/2022 0614   HGBUR SMALL (A) 01/18/2022 0614   BILIRUBINUR NEGATIVE 01/18/2022 0614   BILIRUBINUR negative 07/26/2020 1323   KETONESUR NEGATIVE 01/18/2022 0614   PROTEINUR NEGATIVE 01/18/2022 0614   UROBILINOGEN 0.2 07/26/2020 1323   NITRITE NEGATIVE 01/18/2022 0614   LEUKOCYTESUR NEGATIVE 01/18/2022 0614   Sepsis Labs Recent Labs  Lab 01/17/22 2337 01/19/22 0358  WBC 13.9* 15.3*   Microbiology Recent Results (from the past 240 hour(s))  SARS Coronavirus 2 by RT PCR (hospital order, performed in [facility] hospital lab) *cepheid single result test* Anterior Nasal Swab     Status: None   Collection Time: 01/18/22 12:07 AM   Specimen: Anterior Nasal Swab  Result Value Ref Range Status   SARS Coronavirus 2 by RT PCR NEGATIVE NEGATIVE Final    Comment: (NOTE) SARS-CoV-2 target nucleic acids are NOT DETECTED.  The SARS-CoV-2 RNA is generally detectable in upper and lower respiratory specimens during the acute phase of infection. The lowest concentration of SARS-CoV-2 viral copies this assay can detect is 250 copies / mL. A negative result does not preclude SARS-CoV-2 infection and should not be used as the sole basis for treatment or other patient management decisions.  A negative result may occur with improper specimen collection / handling, submission of specimen other than nasopharyngeal swab, presence of viral mutation(s) within the areas targeted by this assay, and inadequate number of viral copies (<250 copies / mL). A negative result must be combined with clinical observations, patient history, and epidemiological information.  Fact Sheet for Patients:   https://www.patel.info/  Fact Sheet for Healthcare Providers: https://hall.com/  This test is not yet approved or  cleared by the [place] FDA and has been authorized for detection and/or diagnosis of SARS-CoV-2  by FDA under an Emergency Use Authorization (EUA).  This EUA will remain in effect (meaning this test can be used) for the duration of the COVID-19 declaration under Section 564(b)(1) of the Act, 21 U.S.C. section 360bbb-3(b)(1), unless the authorization is terminated or revoked sooner.  Performed at [facility], [address], [place], [place] [address]   MRSA Next Gen by PCR, Nasal     Status: None   Collection Time: 01/18/22  8:33 AM   Specimen: Nasal Mucosa; Nasal Swab  Result Value Ref Range Status   MRSA by PCR Next Gen NOT DETECTED NOT DETECTED Final    Comment: (NOTE) The GeneXpert MRSA Assay (FDA approved for NASAL specimens only), is one component of a comprehensive MRSA colonization surveillance program. It is not intended to diagnose MRSA infection nor to guide or monitor treatment for MRSA infections. Test performance is not FDA approved in patients less than 16 years old. Performed at [facility], [address] [place],  [address]    Time coordinating discharge: 36 mins  SIGNED:  Irwin Brakeman, MD  [facility] 01/19/2022, 10:52 AM How to contact the [facility] Attending or Consulting provider [place] or covering provider during after hours [place], for this patient?  Check the care team in [facility] and look for a) attending/consulting TR[facility] provider listed and b) the [facility] team listed Log into [email or web address] and use [place]'s universal password to access. If you do not have the password, please contact the hospital operator. Locate the [facility] provider you are looking for under [facility] and page to a number that you can be directly reached. If you still have difficulty reaching the provider, please page the [facility] (Director on Call) for the Hospitalists listed on amion for assistance.

## 2022-01-19 NOTE — Progress Notes (Signed)
Patient given discharge instructions regarding O2 delivery and management, patient verbally expressed understanding of instructions. Patient IV removed and given all belongings.Patient in stable condition waiting for husband to pick her up for discharge.

## 2022-01-19 NOTE — TOC Transition Note (Signed)
Transition of Care [facility]) - CM/SW Discharge Note   Patient Details  Name: Lori Lopez MRN: [id number] Date of Birth: 12/26/1950  [facility]) CM/SW Contact:  Shade Flood, LCSW Phone Number: 01/19/2022, 11:22 AM   Clinical Narrative:     TOC notified by MD that pt in need of Home o2 for dc. Spoke with pt to review dc planning. CMS provider options reviewed with pt who selects [place]. Referral made. Pt has portable tank in her room for dc. She is aware to call [facility] at the time she is leaving the hospital to arrange home delivery.  Pt states she does not have any other TOC needs for dc.  Expected Discharge Plan: Home/Self Care Barriers to Discharge: Barriers Resolved   Patient Goals and CMS Choice Patient states their goals for this hospitalization and ongoing recovery are:: go home CMS Medicare.gov Compare Post Acute Care list provided to:: Patient Choice offered to / list presented to : Patient  Expected Discharge Plan and Services Expected Discharge Plan: Home/Self Care In-house Referral: Clinical Social Work   Post Acute Care Choice: Durable Medical Equipment Living arrangements for the past 2 months: Single Family Home Expected Discharge Date: 01/19/22                                    Prior Living Arrangements/Services Living arrangements for the past 2 months: Single Family Home Lives with:: Spouse Patient language and need for interpreter reviewed:: Yes Do you feel safe going back to the place where you live?: Yes      Need for Family Participation in Patient Care: No (Comment) Care giver support system in place?: Yes (comment)   Criminal Activity/Legal Involvement Pertinent to Current Situation/Hospitalization: No - Comment as needed  Activities of Daily Living      Permission Sought/Granted Permission sought to share information with : Facility Art therapist granted to share information with : Yes, Verbal  Permission Granted     Permission granted to share info w AGENCY: [employer]        Emotional Assessment Appearance:: Appears stated age Attitude/Demeanor/Rapport: Engaged Affect (typically observed): Pleasant Orientation: : Oriented to Self, Oriented to Place, Oriented to  Time, Oriented to Situation Alcohol / Substance Use: Not Applicable Psych Involvement: No (comment)  Admission diagnosis:  Acute exacerbation of chronic obstructive pulmonary disease (COPD) ([place]) [J44.1] Hypoxia [R09.02] COPD exacerbation ([place]) [J44.1] Patient Active Problem List   Diagnosis Date Noted   Acute exacerbation of chronic obstructive pulmonary disease (COPD) ([place]) 01/18/2022   Hyperglycemia 01/18/2022   Leukocytosis 01/18/2022   Tobacco abuse 01/18/2022   SIRS (systemic inflammatory response syndrome) ([place]) 01/18/2022   Hemorrhoids 06/30/2021   Abnormal CT scan, colon 07/28/2020   RUQ pain 07/08/2020   Abdominal hernia 07/08/2020   RUQ abdominal mass 03/17/2020   Abdominal pain 03/17/2020   Acute respiratory failure with hypoxia ([place]) 06/02/2018   Influenza A 06/02/2018   COPD with acute exacerbation ([place]) 06/02/2018   Hypothyroidism 06/02/2018   Sinus tachycardia 06/02/2018   GAD (generalized anxiety disorder) 06/02/2018   Elevated blood pressure reading 06/02/2018   Dysphagia 10/19/2017   GERD (gastroesophageal reflux disease) 04/12/2017   Constipation 03/10/2016   Rectal bleeding 03/10/2016   SUI (stress urinary incontinence, female) 10/13/2014   COPD (chronic obstructive pulmonary disease) ([place]) 11/05/2010   Cigarette nicotine dependence, uncomplicated 97/98/9211   PCP:  Celene Squibb, MD Pharmacy:  [place], [place] - [address] [id number] [place] [address] Phone: [phone] Fax: [phone]     Social Determinants of Health (SDOH) Interventions    Readmission Risk Interventions     No data to display           Final next  level of care: Home/Self Care Barriers to Discharge: Barriers Resolved   Patient Goals and CMS Choice Patient states their goals for this hospitalization and ongoing recovery are:: go home CMS Medicare.gov Compare Post Acute Care list provided to:: Patient Choice offered to / list presented to : Patient  Discharge Placement                       Discharge Plan and Services In-house Referral: Clinical Social Work   Post Acute Care Choice: Durable Medical Equipment                               Social Determinants of Health (SDOH) Interventions     Readmission Risk Interventions     No data to display

## 2022-01-19 NOTE — Progress Notes (Signed)
01/19/2022 10:47 AM  I spoke with [facility] RN S. Brown about discharge.  She says patient should remain inpatient status.    Murvin Natal MD

## 2022-01-19 NOTE — Discharge Instructions (Signed)

## 2022-01-19 NOTE — Progress Notes (Signed)
Ambulated patient as ordered without O2, pulse oximetry 78%, applied O2 @ 1lpm pulse oximetry increased to 88%, continued to ambulate patient back to room and had patient sit and increased O2 to 2lpm to obtain a pulse oximetry of 94%. Patient states she is anxious, prn xanax given PO, will repeat ambulation after prn takes effect.

## 2022-01-21 DIAGNOSIS — G4489 Other headache syndrome: Secondary | ICD-10-CM | POA: Diagnosis not present

## 2022-01-21 DIAGNOSIS — R062 Wheezing: Secondary | ICD-10-CM | POA: Diagnosis not present

## 2022-01-21 DIAGNOSIS — I10 Essential (primary) hypertension: Secondary | ICD-10-CM | POA: Diagnosis not present

## 2022-01-21 DIAGNOSIS — R0902 Hypoxemia: Secondary | ICD-10-CM | POA: Diagnosis not present

## 2022-01-24 DIAGNOSIS — J439 Emphysema, unspecified: Secondary | ICD-10-CM | POA: Diagnosis not present

## 2022-01-24 DIAGNOSIS — J449 Chronic obstructive pulmonary disease, unspecified: Secondary | ICD-10-CM | POA: Diagnosis not present

## 2022-01-24 DIAGNOSIS — I700 Atherosclerosis of aorta: Secondary | ICD-10-CM | POA: Diagnosis not present

## 2022-02-01 ENCOUNTER — Ambulatory Visit: Payer: PPO | Admitting: Internal Medicine

## 2022-02-01 ENCOUNTER — Encounter: Payer: Self-pay | Admitting: Internal Medicine

## 2022-02-01 VITALS — BP 138/88 | HR 70 | Temp 97.9°F | Ht 61.0 in | Wt 151.6 lb

## 2022-02-01 DIAGNOSIS — J9611 Chronic respiratory failure with hypoxia: Secondary | ICD-10-CM

## 2022-02-01 DIAGNOSIS — J449 Chronic obstructive pulmonary disease, unspecified: Secondary | ICD-10-CM | POA: Diagnosis not present

## 2022-02-01 DIAGNOSIS — F17210 Nicotine dependence, cigarettes, uncomplicated: Secondary | ICD-10-CM | POA: Diagnosis not present

## 2022-02-01 DIAGNOSIS — J9612 Chronic respiratory failure with hypercapnia: Secondary | ICD-10-CM | POA: Diagnosis not present

## 2022-02-01 NOTE — Progress Notes (Unsigned)
Lori Lopez, female    DOB: 03-Dec-1950    MRN: [id number]   Brief patient profile:  45  yowf active smoker referred to pulmonary clinic in [place]  02/01/2022 by Dr Nevada Crane for copd eval   Admit date: 01/17/2022 Discharge date: 01/19/2022      Brief Hospitalization Summary: Please see all hospital notes, images, labs for full details of the hospitalization.  ADMISSION HPI: 71 y.o. female with medical history significant of COPD, GERD, tobacco abuse who presents to the emergency department via EMS due to 4-day onset of shortness of breath, wheezing, chest congestion and nonproductive cough, she went to an urgent care on Friday (9/8) but still continued to wheeze and have chest congestion.  She used home nebulizer with only minimal relief.  She continues to smoke cigarettes.  She denies fever, chills, chest pain, headache, nausea, vomiting.  Patient was never intubated due to COPD exacerbation.   ED Course:  In the emergency department, she was tachycardic, and was hypoxic on arrival (84% on room air), she was placed on supplemental oxygen with improvement in O2 sats to 96% on supplemental oxygen via [place] at 3 LPM, other vital signs were within normal range.  Work-up in the ED showed leukocytosis, H/H-15.6/47.7, D-dimer < 0.27, BMP was normal except for glucose of 147.  SARS coronavirus 2 was negative.  Chest x-ray showed no active disease.  Breathing treatment was provided, Decadron 10 mg x 1 was given.  Hospitalist was asked to admit patient for further evaluation and management.   HOSPITAL COURSE BY PROBLEM    Acute respiratory failure with hypoxia due to acute exacerbation of COPD Pt responded well to scheduled duo nebs, Mucinex, Solu-Medrol, azithromycin. Continue Protonix to prevent steroid-induced ulcer Continue incentive spirometry and flutter valve Continue supplemental oxygen to maintain O2 sat > 94% with plan to wean patient off oxygen as tolerated (patient does not use oxygen at  baseline) Pt feeling better, DC home today, follow up with PCP in 1 week Pt will discharge on prednisone taper, nebulizers, doxycycline oral tabs  Pt will discharge on home oxygen arranged prior to discharge 2L/min     SIRS in the setting of above Resolved.  Sepsis ruled out.    Dehydration IV hydration was provided with good results    Hyperglycemia possibly reactive from steroids CBG 147, she appears to have been on prednisone per home med rec CBG (last 3)  Recent Labs (last 2 labs)      Recent Labs    01/18/22 0647  GLUCAP 148*        Leukocytosis possibly reactive from prednisone (leukemoid reaction) WBC 13.9, she appears to have been on prednisone per home med rec   GERD Continue Protonix   Tobacco abuse Patient was counseled on tobacco abuse cessation   DVT prophylaxis: Lovenox    Discharge Diagnoses:  Principal Problem:   Acute exacerbation of chronic obstructive pulmonary disease (COPD) (HCC)  GERD (gastroesophageal reflux disease)   Hyperglycemia   Leukocytosis   Tobacco abuse   SIRS (systemic inflammatory response syndrome) (HCC)      TAKE these medications     albuterol 108 (90 Base) MCG/ACT inhaler Commonly known as: VENTOLIN HFA 2 puffs every 4 (four) hours as needed for shortness of breath or wheezing.    ALPRAZolam 1 MG tablet Commonly known as: XANAX Take 0.5 mg by mouth daily as needed for anxiety.    aspirin EC 81 MG tablet Take 81 mg by mouth daily. Swallow  whole.    doxycycline 100 MG capsule Commonly known as: VIBRAMYCIN Take 1 capsule (100 mg total) by mouth 2 (two) times daily for 3 days.    gabapentin 300 MG capsule Commonly known as: NEURONTIN Take 1 capsule (300 mg total) by mouth 2 (two) times daily.    guaiFENesin-dextromethorphan 100-10 MG/5ML syrup Commonly known as: ROBITUSSIN DM Take 5 mLs by mouth every 4 (four) hours as needed for cough.    ipratropium-albuterol 0.5-2.5 (3) MG/3ML Soln Commonly known as:  DUONEB Take 3 mLs by nebulization every 4 (four) hours as needed.    linaclotide 72 MCG capsule Commonly known as: Linzess Take 1 capsule (72 mcg total) by mouth daily as needed.    pantoprazole 40 MG tablet Commonly known as: PROTONIX Take 40 mg by mouth 2 (two) times daily.    predniSONE 20 MG tablet Commonly known as: DELTASONE Take 3 PO QAM x3days, 2 PO QAM x3days, 1 PO QAM x3days Start taking on: January 20, 2022 What changed:  how much to take how to take this when to take this additional instructions    terbinafine 1 % cream Commonly known as: LAMISIL Apply 1 application topically daily as needed (athlete's foot).    Vitamin D3 25 MCG (1000 UT) Caps Take 1,000 Units by mouth daily.         History of Present Illness  02/01/2022  Pulmonary/ 1st office eval/ Sumedh Shinsato / [place] [facility]  Chief Complaint  Patient presents with   Consult    Consult for abnormal ct scan in June 2023  Has been on oxygen for about 2-3 weeks prn during day and mainly at night   Dyspnea: baseline no inhalers or 02  very active prior to admit 100 to [facility] with a hill fine but not more doe  Cough: white in am  Sleep: bed blocks/ 1 pillow  SABA use: hfa and neb  02 2lpm   No obvious day to day or daytime pattern/variability or assoc excess/ purulent sputum or mucus plugs or hemoptysis or cp or chest tightness, subjective wheeze or overt sinus or hb symptoms.   Sleeping  without nocturnal  or early am exacerbation  of respiratory  c/o's or need for noct saba. Also denies any obvious fluctuation of symptoms with weather or environmental changes or other aggravating or alleviating factors except as outlined above   No unusual exposure hx or h/o childhood pna/ asthma or knowledge of premature birth.  Current Allergies, Complete Past Medical History, Past Surgical History, Family History, and Social History were reviewed in [employer] [employer] record.  ROS  The following are not  active complaints unless bolded Hoarseness, sore throat, dysphagia, dental problems, itching, sneezing,  nasal congestion or discharge of excess mucus or purulent secretions, ear ache,   fever, chills, sweats, unintended wt loss or wt gain, classically pleuritic or exertional cp,  orthopnea pnd or arm/hand swelling  or leg swelling, presyncope, palpitations, abdominal pain, anorexia, nausea, vomiting, diarrhea  or change in bowel habits or change in bladder habits, change in stools or change in urine, dysuria, hematuria,  rash, arthralgias, visual complaints, headache, numbness, weakness or ataxia or problems with walking or coordination,  change in mood or  memory.           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     ALPRAZolam (XANAX) 1 MG tablet Take 0.5 mg by mouth daily as needed for anxiety.     aspirin EC 81 MG tablet Take 81 mg by mouth daily. Swallow whole.     Budeson-Glycopyrrol-Formoterol (BREZTRI AEROSPHERE IN) Inhale into the lungs.     Cholecalciferol (VITAMIN D3) 1000 units CAPS Take 1,000 Units by mouth daily.     gabapentin (NEURONTIN) 300 MG capsule Take 1 capsule (300 mg total) by mouth 2 (two) times daily.     guaiFENesin-dextromethorphan (ROBITUSSIN DM) 100-10 MG/5ML syrup  Take 5 mLs by mouth every 4 (four) hours as needed for cough. 118 mL 0   ipratropium-albuterol (DUONEB) 0.5-2.5 (3) MG/3ML SOLN Take 3 mLs by nebulization every 4 (four) hours as needed. 360 mL 1   linaclotide (LINZESS) 72 MCG capsule Take 1 capsule (72 mcg total) by mouth daily as needed. 30 capsule 5   pantoprazole (PROTONIX) 40 MG tablet Take 40 mg by mouth 2 (two) times daily.     terbinafine (LAMISIL) 1 % cream Apply 1 application topically daily as needed (athlete's foot).               Objective:     BP 138/88 (BP Location: Left Arm, Patient Position: Sitting)   Pulse 70   Temp 97.9 F (36.6 C) (Temporal)   Ht '5\' 1"'$  (1.549 m)   Wt 151 lb 9.6 oz (68.8 kg)   SpO2 97% Comment: 2lo2 pulse  BMI 28.64 kg/m   SpO2: 97 % (2lo2 pulse)    Slt hoarse amb wf nad / edentulous   HEENT : Oropharynx  ***  Nasal turbinates ***   NECK :  without  apparent JVD/ palpable Nodes/TM    LUNGS: no acc muscle use,  Mild barrel  contour chest wall with bilateral  Distant bs s audible wheeze and  without cough on insp or exp maneuvers  and mild  Hyperresonant  to  percussion bilaterally     CV:  RRR  no s3 or murmur or increase in P2, and no edema   ABD:  soft and nontender with pos end  insp Hoover's  in the supine position.  No bruits or organomegaly appreciated   MS:  Nl gait/ ext warm without deformities Or obvious joint restrictions  calf tenderness, cyanosis or clubbing     SKIN: warm and dry without lesions    NEURO:  alert, approp, nl sensorium with  no motor or cerebellar deficits apparent.        Assessment   No problem-specific Assessment & Plan notes found for this encounter.     Christinia Gully, MD 02/01/2022

## 2022-02-01 NOTE — Patient Instructions (Addendum)
Plan A = Automatic = Always=    Breztri Take 2 puffs first thing in am and then another 2 puffs about 12 hours later.   Work on inhaler technique:  relax and gently blow all the way out then take a nice smooth full deep breath back in, triggering the inhaler at same time you start breathing in.  Hold breath in for at least  5 seconds if you can. Blow out breztri  thru nose. Rinse and gargle with water when done.  If mouth or throat bother you at all,  try brushing teeth/gums/tongue with arm and hammer toothpaste/ make a slurry and gargle and spit out.  - remember how golfers take practice swings     Plan B = Backup (to supplement plan A, not to replace it) Only use your albuterol inhaler as a rescue medication to be used if you can't catch your breath by resting or doing a relaxed purse lip breathing pattern.  - The less you use it, the better it will work when you need it. - Ok to use the inhaler up to 2 puffs  every 4 hours if you must but call for appointment if use goes up over your usual need - Don't leave home without it !!  (think of it like the spare tire for your car)   Plan C = Crisis (instead of Plan B but only if Plan B stops working) - only use your albuterol nebulizer if you first try Plan B and it fails to help > ok to use the nebulizer up to every 4 hours but if start needing it regularly call for immediate appointment  Ok to try albuterol 15 min before an activity (on alternating days)  that you know would usually make you short of breath and see if it makes any difference and if makes none then don't take albuterol after activity unless you can't catch your breath as this means it's the resting that helps, not the albuterol.     02 2lpm at bedtime plus Make sure you check your oxygen saturation  AT  your highest level of activity (not after you stop)   to be sure it stays over 90% and adjust  02 flow upward to maintain this level if needed but remember to turn it back to previous  settings when you stop (to conserve your supply).   Suggested e-cigs as an optional  "one way bridge"  Off all tobacco products     PFTs next available in [place]  Please schedule a follow up visit in 3 months but call sooner if needed

## 2022-02-02 ENCOUNTER — Encounter: Payer: Self-pay | Admitting: Internal Medicine

## 2022-02-02 DIAGNOSIS — J9611 Chronic respiratory failure with hypoxia: Secondary | ICD-10-CM | POA: Insufficient documentation

## 2022-02-02 NOTE — Assessment & Plan Note (Addendum)
HC03  01/17/22  = 32  - 02/01/2022   Walked on RA  X 3   lap(s) =  approx 450  ft  @ mod pace, stopped due to end of study  with lowest 02 sats 94% no sob   Advised: 2lpm for now Make sure you check your oxygen saturation  AT  your highest level of activity (not after you stop)   to be sure it stays over 90% and adjust  02 flow upward to maintain this level if needed but remember to turn it back to previous settings when you stop (to conserve your supply).   Each maintenance medication was reviewed in detail including emphasizing most importantly the difference between maintenance and prns and under what circumstances the prns are to be triggered using an action plan format where appropriate.  Total time for H and P, chart review, counseling, reviewing hfa/neb/02 device(s) , directly observing portions of ambulatory 02 saturation study/ and generating customized AVS unique to this office visit / same day charting = 30 min new pt eval

## 2022-02-02 NOTE — Assessment & Plan Note (Signed)
Active smoker s/p aecopd> admit 01/17/22 - 02/01/2022  After extensive coaching inhaler device,  effectiveness =   50% > continue breztri    Group D (now reclassified as E) in terms of symptom/risk and laba/lama/ICS  therefore appropriate rx at this point >>>  breztri plus approp saba  Please schedule a follow up visit in 3 months but call sooner if needed   ABC action plan reviewed   F/u with pfts w/in 3 m

## 2022-02-02 NOTE — Assessment & Plan Note (Signed)
4-5 min discussion re active cigarette smoking in addition to office E&M  Ask about tobacco use:   Ongoing  Advise quitting   I took an extended  opportunity with this patient to outline the consequences of continued cigarette use  in airway disorders based on all the data we have from the multiple national lung health studies (perfomed over decades at millions of dollars in cost)  indicating that smoking cessation, not choice of inhalers or physicians, is the most important aspect of her care.   Assess willingness:  Not committed at this point Assist in quit attempt:  Per PCP when ready Arrange follow up:   Follow up per Primary Care planned   rec Continue yearly screening for lung ca Warned regarding dangers of lit cigs and 02     For smoking cessation classes call [phone]

## 2022-02-03 ENCOUNTER — Telehealth: Payer: Self-pay | Admitting: Internal Medicine

## 2022-02-03 NOTE — Telephone Encounter (Signed)
Try changing to one puff later in am and see if still has the problem and if not just take it 51mn before planned exertion to see if helps  - if still having side effects then just stop it altogether and just use the back up albuterol hfa and neb prn and set up f/u in 2 weeks to regroup and consider other alternatives

## 2022-02-03 NOTE — Telephone Encounter (Signed)
Pt states when taking Breztri 1st thing in the a.m., she sees squiggly lines and gets a headache.  Wondering if it's coming from taking Breztri.  Also asking if she can take Allegra with [employer].  Please advise.

## 2022-02-04 NOTE — Telephone Encounter (Signed)
ATC patient.  LM to call back when available. 

## 2022-02-17 DIAGNOSIS — E785 Hyperlipidemia, unspecified: Secondary | ICD-10-CM | POA: Diagnosis not present

## 2022-02-17 DIAGNOSIS — G629 Polyneuropathy, unspecified: Secondary | ICD-10-CM | POA: Diagnosis not present

## 2022-02-24 DIAGNOSIS — E785 Hyperlipidemia, unspecified: Secondary | ICD-10-CM | POA: Diagnosis not present

## 2022-02-24 DIAGNOSIS — D751 Secondary polycythemia: Secondary | ICD-10-CM | POA: Diagnosis not present

## 2022-02-24 DIAGNOSIS — R7303 Prediabetes: Secondary | ICD-10-CM | POA: Diagnosis not present

## 2022-02-24 DIAGNOSIS — G629 Polyneuropathy, unspecified: Secondary | ICD-10-CM | POA: Diagnosis not present

## 2022-02-24 DIAGNOSIS — K5904 Chronic idiopathic constipation: Secondary | ICD-10-CM | POA: Diagnosis not present

## 2022-02-24 DIAGNOSIS — J439 Emphysema, unspecified: Secondary | ICD-10-CM | POA: Diagnosis not present

## 2022-02-24 DIAGNOSIS — H9313 Tinnitus, bilateral: Secondary | ICD-10-CM | POA: Diagnosis not present

## 2022-02-24 DIAGNOSIS — I700 Atherosclerosis of aorta: Secondary | ICD-10-CM | POA: Diagnosis not present

## 2022-02-24 DIAGNOSIS — Z23 Encounter for immunization: Secondary | ICD-10-CM | POA: Diagnosis not present

## 2022-02-24 DIAGNOSIS — K219 Gastro-esophageal reflux disease without esophagitis: Secondary | ICD-10-CM | POA: Diagnosis not present

## 2022-02-24 DIAGNOSIS — J449 Chronic obstructive pulmonary disease, unspecified: Secondary | ICD-10-CM | POA: Diagnosis not present

## 2022-02-24 DIAGNOSIS — F17200 Nicotine dependence, unspecified, uncomplicated: Secondary | ICD-10-CM | POA: Diagnosis not present

## 2022-03-07 DIAGNOSIS — J449 Chronic obstructive pulmonary disease, unspecified: Secondary | ICD-10-CM | POA: Diagnosis not present

## 2022-03-07 DIAGNOSIS — J9610 Chronic respiratory failure, unspecified whether with hypoxia or hypercapnia: Secondary | ICD-10-CM | POA: Diagnosis not present

## 2022-03-10 DIAGNOSIS — J449 Chronic obstructive pulmonary disease, unspecified: Secondary | ICD-10-CM | POA: Diagnosis not present

## 2022-03-16 ENCOUNTER — Ambulatory Visit (INDEPENDENT_AMBULATORY_CARE_PROVIDER_SITE_OTHER): Payer: PPO | Admitting: Internal Medicine

## 2022-03-16 ENCOUNTER — Encounter: Payer: Self-pay | Admitting: Internal Medicine

## 2022-03-16 VITALS — BP 130/78 | HR 74 | Temp 98.2°F | Ht 62.0 in | Wt 158.4 lb

## 2022-03-16 DIAGNOSIS — J9611 Chronic respiratory failure with hypoxia: Secondary | ICD-10-CM | POA: Diagnosis not present

## 2022-03-16 DIAGNOSIS — J9612 Chronic respiratory failure with hypercapnia: Secondary | ICD-10-CM

## 2022-03-16 DIAGNOSIS — F17210 Nicotine dependence, cigarettes, uncomplicated: Secondary | ICD-10-CM

## 2022-03-16 DIAGNOSIS — J449 Chronic obstructive pulmonary disease, unspecified: Secondary | ICD-10-CM

## 2022-03-16 NOTE — Progress Notes (Unsigned)
Lori Lopez, female    DOB: 03-Dec-1950    MRN: [id number]   Brief patient profile:  45  yowf active smoker referred to pulmonary clinic in [place]  02/01/2022 by Dr Nevada Crane for copd eval   Admit date: 01/17/2022 Discharge date: 01/19/2022      Brief Hospitalization Summary: Please see all hospital notes, images, labs for full details of the hospitalization.  ADMISSION HPI: 71 y.o. female with medical history significant of COPD, GERD, tobacco abuse who presents to the emergency department via EMS due to 4-day onset of shortness of breath, wheezing, chest congestion and nonproductive cough, she went to an urgent care on Friday (9/8) but still continued to wheeze and have chest congestion.  She used home nebulizer with only minimal relief.  She continues to smoke cigarettes.  She denies fever, chills, chest pain, headache, nausea, vomiting.  Patient was never intubated due to COPD exacerbation.   ED Course:  In the emergency department, she was tachycardic, and was hypoxic on arrival (84% on room air), she was placed on supplemental oxygen with improvement in O2 sats to 96% on supplemental oxygen via [place] at 3 LPM, other vital signs were within normal range.  Work-up in the ED showed leukocytosis, H/H-15.6/47.7, D-dimer < 0.27, BMP was normal except for glucose of 147.  SARS coronavirus 2 was negative.  Chest x-ray showed no active disease.  Breathing treatment was provided, Decadron 10 mg x 1 was given.  Hospitalist was asked to admit patient for further evaluation and management.   HOSPITAL COURSE BY PROBLEM    Acute respiratory failure with hypoxia due to acute exacerbation of COPD Pt responded well to scheduled duo nebs, Mucinex, Solu-Medrol, azithromycin. Continue Protonix to prevent steroid-induced ulcer Continue incentive spirometry and flutter valve Continue supplemental oxygen to maintain O2 sat > 94% with plan to wean patient off oxygen as tolerated (patient does not use oxygen at  baseline) Pt feeling better, DC home today, follow up with PCP in 1 week Pt will discharge on prednisone taper, nebulizers, doxycycline oral tabs  Pt will discharge on home oxygen arranged prior to discharge 2L/min     SIRS in the setting of above Resolved.  Sepsis ruled out.    Dehydration IV hydration was provided with good results    Hyperglycemia possibly reactive from steroids CBG 147, she appears to have been on prednisone per home med rec CBG (last 3)  Recent Labs (last 2 labs)      Recent Labs    01/18/22 0647  GLUCAP 148*        Leukocytosis possibly reactive from prednisone (leukemoid reaction) WBC 13.9, she appears to have been on prednisone per home med rec   GERD Continue Protonix   Tobacco abuse Patient was counseled on tobacco abuse cessation   DVT prophylaxis: Lovenox    Discharge Diagnoses:  Principal Problem:   Acute exacerbation of chronic obstructive pulmonary disease (COPD) (HCC)  GERD (gastroesophageal reflux disease)   Hyperglycemia   Leukocytosis   Tobacco abuse   SIRS (systemic inflammatory response syndrome) (HCC)      TAKE these medications     albuterol 108 (90 Base) MCG/ACT inhaler Commonly known as: VENTOLIN HFA 2 puffs every 4 (four) hours as needed for shortness of breath or wheezing.    ALPRAZolam 1 MG tablet Commonly known as: XANAX Take 0.5 mg by mouth daily as needed for anxiety.    aspirin EC 81 MG tablet Take 81 mg by mouth daily. Swallow  whole.    doxycycline 100 MG capsule Commonly known as: VIBRAMYCIN Take 1 capsule (100 mg total) by mouth 2 (two) times daily for 3 days.    gabapentin 300 MG capsule Commonly known as: NEURONTIN Take 1 capsule (300 mg total) by mouth 2 (two) times daily.    guaiFENesin-dextromethorphan 100-10 MG/5ML syrup Commonly known as: ROBITUSSIN DM Take 5 mLs by mouth every 4 (four) hours as needed for cough.    ipratropium-albuterol 0.5-2.5 (3) MG/3ML Soln Commonly known as:  DUONEB Take 3 mLs by nebulization every 4 (four) hours as needed.    linaclotide 72 MCG capsule Commonly known as: Linzess Take 1 capsule (72 mcg total) by mouth daily as needed.    pantoprazole 40 MG tablet Commonly known as: PROTONIX Take 40 mg by mouth 2 (two) times daily.    predniSONE 20 MG tablet Commonly known as: DELTASONE Take 3 PO QAM x3days, 2 PO QAM x3days, 1 PO QAM x3days Start taking on: January 20, 2022 What changed:  how much to take how to take this when to take this additional instructions    terbinafine 1 % cream Commonly known as: LAMISIL Apply 1 application topically daily as needed (athlete's foot).    Vitamin D3 25 MCG (1000 UT) Caps Take 1,000 Units by mouth daily.         History of Present Illness  02/01/2022  Pulmonary/ 1st office eval/ Lori Lopez / [place] Office  Chief Complaint  Patient presents with   Consult    Consult for abnormal ct scan in June 2023  Has been on oxygen for about 2-3 weeks prn during day and mainly at night   Dyspnea:   very active prior to admit 100 ft to MB with a hill fine but not more doing since d/c.  Says did use neb prior to admit but no maint resp rx or 02 Cough: white in am  Sleep: bed blocks/ 1 pillow  SABA use: hfa and neb  02 2lpm hs  Rec Plan A = Automatic = Always=    Breztri Take 2 puffs first thing in am and then another 2 puffs about 12 hours later.  Work on inhaler technique:    Plan B = Backup (to supplement plan A, not to replace it) Only use your albuterol inhaler as a rescue medication  Plan C = Crisis (instead of Plan B but only if Plan B stops working) - only use your albuterol nebulizer if you first try [place] to try albuterol 15 min before an activity (on alternating days)  that you know would usually make you short of breath   02 2lpm at bedtime plus Make sure you check your oxygen saturation  AT  your highest level of activity (not after you stop)   to be sure it stays over  90%  Suggested e-cigs as an optional  "one way bridge"  Off all tobacco products    PFTs next available in [place]. - not done as of 03/16/2022    03/16/2022  f/u ov/[place] office/Lori Lopez re: Group E Copd/ 02 hs only  maint on breztri   Chief Complaint  Patient presents with   Follow-up    Has questions about ventilator?    Dyspnea:  around her property some hills/ slowed by L thigh pain  Cough:  much better  Sleeping: bed blocks/ on side / one pillow  SABA use: very little need  02: 2lpm hs only/ eval for noct needs planned per Dr  Hall's office  Covid status: vax never / never infectred  Lung cancer screening: 10/07/21  pos emphysema    No obvious day to day or daytime variability or assoc excess/ purulent sputum or mucus plugs or hemoptysis or cp or chest tightness, subjective wheeze or overt sinus or hb symptoms.   Sleeping  without nocturnal  or early am exacerbation  of respiratory  c/o's or need for noct saba. Also denies any obvious fluctuation of symptoms with weather or environmental changes or other aggravating or alleviating factors except as outlined above   No unusual exposure hx or h/o childhood pna/ asthma or knowledge of premature birth.  Current Allergies, Complete Past Medical History, Past Surgical History, Family History, and Social History were reviewed in [employer] record.  ROS  The following are not active complaints unless bolded Hoarseness, sore throat, dysphagia, dental problems, itching, sneezing,  nasal congestion or discharge of excess mucus or purulent secretions, ear ache,   fever, chills, sweats, unintended wt loss or wt gain, classically pleuritic or exertional cp,  orthopnea pnd or arm/hand swelling  or leg swelling, presyncope, palpitations, abdominal pain, anorexia, nausea, vomiting, diarrhea  or change in bowel habits or change in bladder habits, change in stools or change in urine, dysuria, hematuria,  rash, arthralgias,  visual complaints, headache, numbness, weakness or ataxia or problems with walking or coordination,  change in mood or  memory.        Current Meds  Medication Sig   albuterol (VENTOLIN HFA) 108 (90 Base) MCG/ACT inhaler 2 puffs every 4 (four) hours as needed for shortness of breath or wheezing.   ALPRAZolam (XANAX) 1 MG tablet Take 0.5 mg by mouth daily as needed for anxiety.   aspirin EC 81 MG tablet Take 81 mg by mouth daily. Swallow whole.   Budeson-Glycopyrrol-Formoterol (BREZTRI AEROSPHERE IN) Inhale into the lungs.   Cholecalciferol (VITAMIN D3) 1000 units CAPS Take 1,000 Units by mouth daily.   gabapentin (NEURONTIN) 300 MG capsule Take 1 capsule (300 mg total) by mouth 2 (two) times daily.   guaiFENesin-dextromethorphan (ROBITUSSIN DM) 100-10 MG/5ML syrup Take 5 mLs by mouth every 4 (four) hours as needed for cough.   ipratropium-albuterol (DUONEB) 0.5-2.5 (3) MG/3ML SOLN Take 3 mLs by nebulization every 4 (four) hours as needed.   linaclotide (LINZESS) 72 MCG capsule Take 1 capsule (72 mcg total) by mouth daily as needed.   pantoprazole (PROTONIX) 40 MG tablet Take 40 mg by mouth 2 (two) times daily.   terbinafine (LAMISIL) 1 % cream Apply 1 application topically daily as needed (athlete's foot).               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     ALPRAZolam (XANAX) 1 MG tablet Take 0.5 mg by mouth daily as needed for  anxiety.     aspirin EC 81 MG tablet Take 81 mg by mouth daily. Swallow whole.     Budeson-Glycopyrrol-Formoterol (BREZTRI AEROSPHERE IN) Inhale into the lungs.     Cholecalciferol (VITAMIN D3) 1000 units CAPS Take 1,000 Units by mouth daily.     gabapentin (NEURONTIN) 300 MG capsule Take 1 capsule (300 mg total) by mouth 2 (two) times daily.     guaiFENesin-dextromethorphan (ROBITUSSIN DM) 100-10 MG/5ML syrup Take 5 mLs by mouth every 4 (four) hours as needed for cough. 118 mL 0   ipratropium-albuterol (DUONEB) 0.5-2.5 (3) MG/3ML SOLN Take 3 mLs by nebulization every 4 (four) hours as needed. 360 mL 1   linaclotide (LINZESS) 72 MCG capsule Take 1 capsule (72 mcg total) by mouth daily as needed. 30 capsule 5   pantoprazole (PROTONIX) 40 MG tablet Take 40 mg by mouth 2 (two) times daily.     terbinafine (LAMISIL) 1 % cream Apply 1 application topically daily as needed (athlete's foot).               Objective:     Wt Readings from Last 3 Encounters:  03/16/22 158 lb 6.4 oz (71.8 kg)  02/01/22 151 lb 9.6 oz (68.8 kg)  01/18/22 158 lb 15.2 oz (72.1 kg)      Vital signs reviewed  03/16/2022  - Note at rest 02 sats  96% on RA   General appearance:    hoarse wf nad  HEENT : Oropharynx  clear/ edentulus  Nasal turbinates nl   NECK :  without  apparent JVD/ palpable Nodes/TM    LUNGS: no acc muscle use,  Mild barrel  contour chest wall with bilateral  Distant bs s audible wheeze and  without cough on insp or exp maneuvers  and mild  Hyperresonant  to  percussion bilaterally     CV:  RRR  no s3 or murmur or increase in P2, and no edema   ABD:  soft and nontender with pos end  insp Hoover's  in the supine position.  No bruits or organomegaly appreciated   MS:  Nl gait/ ext warm without deformities Or obvious joint restrictions  calf  tenderness, cyanosis or clubbing     SKIN: warm and dry without lesions    NEURO:  alert, approp, nl sensorium with  no motor or cerebellar deficits apparent.         I personally reviewed images and agree with radiology impression as follows:   Chest LDSCT  10/07/21 Centrilobular and paraseptal emphysema. Smoking related respiratory bronchiolitis.       Assessment

## 2022-03-16 NOTE — Patient Instructions (Addendum)
No change in your medications   Check with Dr Juel Burrow office regarding your sleep evaluation/ oxygen needs at bedtime  but for now continue 2lpm at bedtime and none needed daytime   Work hard on stopping all cigarettes and vapes    Please schedule a follow up visit in 6  months but call sooner if needed  with pft's in meantime

## 2022-03-17 ENCOUNTER — Encounter: Payer: Self-pay | Admitting: Internal Medicine

## 2022-03-17 ENCOUNTER — Other Ambulatory Visit: Payer: Self-pay

## 2022-03-17 ENCOUNTER — Emergency Department (HOSPITAL_COMMUNITY)
Admission: EM | Admit: 2022-03-17 | Discharge: 2022-03-17 | Disposition: A | Discharge status: Home / Self Care | Payer: PPO | Attending: Emergency Medicine | Admitting: Emergency Medicine

## 2022-03-17 ENCOUNTER — Emergency Department (HOSPITAL_COMMUNITY): Payer: PPO

## 2022-03-17 ENCOUNTER — Encounter (HOSPITAL_COMMUNITY): Payer: Self-pay

## 2022-03-17 DIAGNOSIS — J441 Chronic obstructive pulmonary disease with (acute) exacerbation: Secondary | ICD-10-CM | POA: Diagnosis not present

## 2022-03-17 DIAGNOSIS — R0602 Shortness of breath: Secondary | ICD-10-CM | POA: Diagnosis not present

## 2022-03-17 DIAGNOSIS — Z20822 Contact with and (suspected) exposure to covid-19: Secondary | ICD-10-CM | POA: Diagnosis not present

## 2022-03-17 DIAGNOSIS — R0689 Other abnormalities of breathing: Secondary | ICD-10-CM | POA: Diagnosis not present

## 2022-03-17 DIAGNOSIS — R059 Cough, unspecified: Secondary | ICD-10-CM | POA: Diagnosis not present

## 2022-03-17 DIAGNOSIS — I959 Hypotension, unspecified: Secondary | ICD-10-CM | POA: Diagnosis not present

## 2022-03-17 DIAGNOSIS — Z7982 Long term (current) use of aspirin: Secondary | ICD-10-CM | POA: Diagnosis not present

## 2022-03-17 DIAGNOSIS — Z7951 Long term (current) use of inhaled steroids: Secondary | ICD-10-CM | POA: Diagnosis not present

## 2022-03-17 DIAGNOSIS — R062 Wheezing: Secondary | ICD-10-CM | POA: Diagnosis not present

## 2022-03-17 DIAGNOSIS — R52 Pain, unspecified: Secondary | ICD-10-CM | POA: Diagnosis not present

## 2022-03-17 DIAGNOSIS — R457 State of emotional shock and stress, unspecified: Secondary | ICD-10-CM | POA: Diagnosis not present

## 2022-03-17 DIAGNOSIS — J439 Emphysema, unspecified: Secondary | ICD-10-CM | POA: Diagnosis not present

## 2022-03-17 DIAGNOSIS — R069 Unspecified abnormalities of breathing: Secondary | ICD-10-CM | POA: Diagnosis not present

## 2022-03-17 LAB — CBC
HCT: 41.1 % (ref 36.0–46.0)
Hemoglobin: 14 g/dL (ref 12.0–15.0)
MCH: 31 pg (ref 26.0–34.0)
MCHC: 34.1 g/dL (ref 30.0–36.0)
MCV: 90.9 fL (ref 80.0–100.0)
Platelets: 284 10*3/uL (ref 150–400)
RBC: 4.52 MIL/uL (ref 3.87–5.11)
RDW: 13.6 % (ref 11.5–15.5)
WBC: 8.6 10*3/uL (ref 4.0–10.5)
nRBC: 0 % (ref 0.0–0.2)

## 2022-03-17 LAB — BASIC METABOLIC PANEL
Anion gap: 7 (ref 5–15)
BUN: 11 mg/dL (ref 8–23)
CO2: 28 mmol/L (ref 22–32)
Calcium: 8.7 mg/dL — ABNORMAL LOW (ref 8.9–10.3)
Chloride: 105 mmol/L (ref 98–111)
Creatinine, Ser: 0.88 mg/dL (ref 0.44–1.00)
GFR, Estimated: >60 mL/min (ref >60)
Glucose, Bld: 112 mg/dL — ABNORMAL HIGH (ref 70–99)
Potassium: 3.9 mmol/L (ref 3.5–5.1)
Sodium: 140 mmol/L (ref 135–145)

## 2022-03-17 LAB — RESP PANEL BY RT-PCR (FLU A&B, COVID) ARPGX2
Influenza A by PCR: NEGATIVE
Influenza B by PCR: NEGATIVE
SARS Coronavirus 2 by RT PCR: NEGATIVE

## 2022-03-17 LAB — BRAIN NATRIURETIC PEPTIDE: B Natriuretic Peptide: 44 pg/mL (ref 0.0–100.0)

## 2022-03-17 MED ORDER — GUAIFENESIN-DM 100-10 MG/5ML PO SYRP
15.0000 mL | ORAL_SOLUTION | Freq: Once | ORAL | Status: AC
  Administered 2022-03-17: 15 mL via ORAL
  Filled 2022-03-17: qty 15

## 2022-03-17 MED ORDER — ALBUTEROL SULFATE (2.5 MG/3ML) 0.083% IN NEBU
5.0000 mg | INHALATION_SOLUTION | Freq: Once | RESPIRATORY_TRACT | Status: AC
  Administered 2022-03-17: 2.5 mg via RESPIRATORY_TRACT
  Filled 2022-03-17: qty 6

## 2022-03-17 MED ORDER — ALBUTEROL SULFATE (2.5 MG/3ML) 0.083% IN NEBU
5.0000 mg | INHALATION_SOLUTION | Freq: Once | RESPIRATORY_TRACT | Status: AC
  Administered 2022-03-17: 5 mg via RESPIRATORY_TRACT
  Filled 2022-03-17: qty 6

## 2022-03-17 MED ORDER — METHYLPREDNISOLONE SODIUM SUCC 125 MG IJ SOLR
125.0000 mg | Freq: Once | INTRAMUSCULAR | Status: AC
  Administered 2022-03-17: 125 mg via INTRAVENOUS
  Filled 2022-03-17: qty 2

## 2022-03-17 MED ORDER — IPRATROPIUM BROMIDE 0.02 % IN SOLN
0.5000 mg | Freq: Once | RESPIRATORY_TRACT | Status: AC
  Administered 2022-03-17: 0.5 mg via RESPIRATORY_TRACT
  Filled 2022-03-17: qty 2.5

## 2022-03-17 MED ORDER — ACETAMINOPHEN 500 MG PO TABS
1000.0000 mg | ORAL_TABLET | Freq: Once | ORAL | Status: AC
  Administered 2022-03-17: 1000 mg via ORAL
  Filled 2022-03-17: qty 2

## 2022-03-17 MED ORDER — ALUM & MAG HYDROXIDE-SIMETH 200-200-20 MG/5ML PO SUSP
30.0000 mL | Freq: Once | ORAL | Status: AC
  Administered 2022-03-17: 30 mL via ORAL
  Filled 2022-03-17: qty 30

## 2022-03-17 MED ORDER — FAMOTIDINE 20 MG PO TABS
20.0000 mg | ORAL_TABLET | Freq: Once | ORAL | Status: AC
  Administered 2022-03-17: 20 mg via ORAL
  Filled 2022-03-17: qty 1

## 2022-03-17 MED ORDER — PREDNISONE 20 MG PO TABS: 60.0000 mg | ORAL_TABLET | Freq: Every day | ORAL | 0 refills | Status: DC

## 2022-03-17 MED ORDER — ALBUTEROL SULFATE HFA 108 (90 BASE) MCG/ACT IN AERS: 2.0000 | INHALATION_SPRAY | RESPIRATORY_TRACT | 3 refills | Status: DC | PRN

## 2022-03-17 NOTE — Assessment & Plan Note (Signed)
4-5 min discussion re active cigarette smoking in addition to office E&M  Ask about tobacco use:   ongoing plus vaping Advise quitting   advised need to move completely to vaping then wean the vaping as the best way to use vapes is a "one way bridge" off all tobacco / nicotine products  Assess willingness:  Not committed at this point Assist in quit attempt:  Per PCP when ready Arrange follow up:   Follow up per Primary Care planned

## 2022-03-17 NOTE — Assessment & Plan Note (Signed)
Active smoker s/p aecopd> admit 01/17/22 - 02/01/2022  continue breztri  - 03/16/2022  After extensive coaching inhaler device,  effectiveness =    75% (short ti)    Group D (now reclassified as E) in terms of symptom/risk and laba/lama/ICS  therefore appropriate rx at this point >>>  breztri and approp saba

## 2022-03-17 NOTE — ED Provider Notes (Signed)
[facility] EMERGENCY DEPARTMENT Provider Note   CSN: [id number] Arrival date & time: 03/17/22  1416     History  Chief Complaint  Patient presents with   Shortness of Breath    Lori Lopez is a 72 y.o. female.  Pt with hx copd, presents with sob, wheezing in past 1-2 days. Symptoms acute onset, moderate, persistent. Use home o2 2 liters at night. +increased non prod cough. No sore throat or runny nose. No known ill contacts. No fever or chills. Denies leg pain or swelling. No chest pain or discomfort. +smoker.   The history is provided by the patient, the EMS personnel and medical records.  Shortness of Breath Associated symptoms: cough   Associated symptoms: no abdominal pain, no chest pain, no fever, no headaches, no neck pain, no rash, no sore throat and no vomiting        Home Medications Prior to Admission medications   Medication Sig Start Date End Date Taking? Authorizing Provider  albuterol (VENTOLIN HFA) 108 (90 Base) MCG/ACT inhaler 2 puffs every 4 (four) hours as needed for shortness of breath or wheezing. 09/21/21  Yes [provider]  albuterol (VENTOLIN HFA) 108 (90 Base) MCG/ACT inhaler Inhale 2 puffs into the lungs every 4 (four) hours as needed for wheezing or shortness of breath. 03/17/22  Yes Lajean Saver, MD  ALPRAZolam Duanne Moron) 1 MG tablet Take 0.5 mg by mouth daily as needed for anxiety. 02/10/16  Yes [provider]  aspirin EC 81 MG tablet Take 81 mg by mouth daily. Swallow whole.   Yes [provider]  Budeson-Glycopyrrol-Formoterol (BREZTRI AEROSPHERE IN) Inhale into the lungs.   Yes [provider]  Cholecalciferol (VITAMIN D3) 1000 units CAPS Take 1,000 Units by mouth daily.   Yes [provider]  gabapentin (NEURONTIN) 300 MG capsule Take 1 capsule (300 mg total) by mouth 2 (two) times daily. 01/19/22  Yes Johnson, Clanford L, MD  guaiFENesin-dextromethorphan (ROBITUSSIN DM) 100-10 MG/5ML syrup Take 5 mLs  by mouth every 4 (four) hours as needed for cough. 01/19/22  Yes Johnson, Clanford L, MD  ipratropium-albuterol (DUONEB) 0.5-2.5 (3) MG/3ML SOLN Take 3 mLs by nebulization every 4 (four) hours as needed. 01/19/22  Yes Johnson, Clanford L, MD  linaclotide (LINZESS) 72 MCG capsule Take 1 capsule (72 mcg total) by mouth daily as needed. 06/30/21  Yes Mahala Menghini, PA-C  pantoprazole (PROTONIX) 40 MG tablet Take 40 mg by mouth 2 (two) times daily.   Yes [provider]  predniSONE (DELTASONE) 20 MG tablet Take 3 tablets (60 mg total) by mouth daily. 03/18/22  Yes Lajean Saver, MD  terbinafine (LAMISIL) 1 % cream Apply 1 application topically daily as needed (athlete's foot).   Yes [provider]  hydrochlorothiazide (HYDRODIURIL) 12.5 MG tablet Take 12.5 mg by mouth daily. Patient not taking: Reported on 03/17/2022 02/24/22   [provider]  nystatin (MYCOSTATIN) 100000 UNIT/ML suspension Take 5 mLs by mouth 4 (four) times daily. Patient not taking: Reported on 03/17/2022 02/24/22   [provider]      Allergies    Shrimp [shellfish allergy], Hydrocodone, and Lyrica [pregabalin]    Review of Systems   Review of Systems  Constitutional:  Negative for chills and fever.  HENT:  Negative for sore throat.   Eyes:  Negative for redness.  Respiratory:  Positive for cough and shortness of breath.   Cardiovascular:  Negative for chest pain, palpitations and leg swelling.  Gastrointestinal:  Negative for  abdominal pain, diarrhea, nausea and vomiting.  Genitourinary:  Negative for dysuria and flank pain.  Musculoskeletal:  Negative for back pain and neck pain.  Skin:  Negative for rash.  Neurological:  Negative for headaches.  Hematological:  Does not bruise/bleed easily.  Psychiatric/Behavioral:  Negative for confusion.     Physical Exam Updated Vital Signs BP 136/61   Pulse 81   Temp 97.7 F (36.5 C) (Oral)   Resp 18   Ht 1.575 m ('5\' 2"'$ )   Wt 71.7 kg    SpO2 96%   BMI 28.90 kg/m  Physical Exam Vitals and nursing note reviewed.  Constitutional:      Appearance: Normal appearance. She is well-developed.  HENT:     Head: Atraumatic.     Nose: Nose normal.     Mouth/Throat:     Mouth: Mucous membranes are moist.  Eyes:     General: No scleral icterus.    Conjunctiva/sclera: Conjunctivae normal.  Neck:     Trachea: No tracheal deviation.  Cardiovascular:     Rate and Rhythm: Normal rate and regular rhythm.     Pulses: Normal pulses.     Heart sounds: Normal heart sounds. No murmur heard.    No friction rub. No gallop.  Pulmonary:     Effort: Pulmonary effort is normal. No respiratory distress.     Breath sounds: Wheezing present.  Abdominal:     General: Bowel sounds are normal. There is no distension.     Palpations: Abdomen is soft.     Tenderness: There is no abdominal tenderness.  Genitourinary:    Comments: No cva tenderness.  Musculoskeletal:        General: No swelling or tenderness.     Cervical back: Normal range of motion and neck supple. No rigidity. No muscular tenderness.  Skin:    General: Skin is warm and dry.     Findings: No rash.  Neurological:     Mental Status: She is alert.     Comments: Alert, speech normal.   Psychiatric:        Mood and Affect: Mood normal.     ED Results / Procedures / Treatments   Labs (all labs ordered are listed, but only abnormal results are displayed) Results for orders placed or performed during the hospital encounter of 03/17/22  Resp Panel by RT-PCR (Flu A&B, Covid) Anterior Nasal Swab   Specimen: Anterior Nasal Swab  Result Value Ref Range   SARS Coronavirus 2 by RT PCR NEGATIVE NEGATIVE   Influenza A by PCR NEGATIVE NEGATIVE   Influenza B by PCR NEGATIVE NEGATIVE  Basic metabolic panel  Result Value Ref Range   Sodium 140 135 - 145 mmol/L   Potassium 3.9 3.5 - 5.1 mmol/L   Chloride 105 98 - 111 mmol/L   CO2 28 22 - 32 mmol/L   Glucose, Bld 112 (H) 70 - 99  mg/dL   BUN 11 8 - 23 mg/dL   Creatinine, Ser 0.88 0.44 - 1.00 mg/dL   Calcium 8.7 (L) 8.9 - 10.3 mg/dL   GFR, Estimated >60 >60 mL/min   Anion gap 7 5 - 15  Brain natriuretic peptide  Result Value Ref Range   B Natriuretic Peptide 44.0 0.0 - 100.0 pg/mL  CBC  Result Value Ref Range   WBC 8.6 4.0 - 10.5 K/uL   RBC 4.52 3.87 - 5.11 MIL/uL   Hemoglobin 14.0 12.0 - 15.0 g/dL   HCT 41.1 36.0 - 46.0 %  MCV 90.9 80.0 - 100.0 fL   MCH 31.0 26.0 - 34.0 pg   MCHC 34.1 30.0 - 36.0 g/dL   RDW 13.6 11.5 - 15.5 %   Platelets 284 150 - 400 K/uL   nRBC 0.0 0.0 - 0.2 %    EKG EKG Interpretation  Date/Time:  Thursday March 17 2022 15:48:01 EST Ventricular Rate:  77 PR Interval:  162 QRS Duration: 96 QT Interval:  392 QTC Calculation: 444 R Axis:   72 Text Interpretation: Sinus rhythm No significant change since last tracing Confirmed by Lajean Saver [phone]) on 03/17/2022 3:54:47 PM  Radiology DG Chest Portable 1 View  Result Date: 03/17/2022 CLINICAL DATA:  Shortness of breath and cough. EXAM: PORTABLE CHEST 1 VIEW COMPARISON:  01/17/2022 FINDINGS: The cardiac silhouette, mediastinal and hilar contours are within normal limits and stable. Stable underlying emphysematous changes and mild hyperinflation. No infiltrates, edema or effusions. No pulmonary lesions. The bony thorax is intact. IMPRESSION: Emphysematous changes but no acute pulmonary findings. Electronically Signed   By: Marijo Sanes M.D.   On: 03/17/2022 15:26    Procedures Procedures    Medications Ordered in ED Medications  albuterol (PROVENTIL) (2.5 MG/3ML) 0.083% nebulizer solution 5 mg (5 mg Nebulization Given 03/17/22 1522)  ipratropium (ATROVENT) nebulizer solution 0.5 mg (0.5 mg Nebulization Given 03/17/22 1522)  methylPREDNISolone sodium succinate (SOLU-MEDROL) 125 mg/2 mL injection 125 mg (125 mg Intravenous Given 03/17/22 1558)  alum & mag hydroxide-simeth (MAALOX/MYLANTA) 200-200-20 MG/5ML suspension 30 mL (30  mLs Oral Given 03/17/22 1751)  famotidine (PEPCID) tablet 20 mg (20 mg Oral Given 03/17/22 1751)  acetaminophen (TYLENOL) tablet 1,000 mg (1,000 mg Oral Given 03/17/22 1751)  guaiFENesin-dextromethorphan (ROBITUSSIN DM) 100-10 MG/5ML syrup 15 mL (15 mLs Oral Given 03/17/22 1751)  albuterol (PROVENTIL) (2.5 MG/3ML) 0.083% nebulizer solution 5 mg (2.5 mg Nebulization Given 03/17/22 1742)    ED Course/ Medical Decision Making/ A&P                           Medical Decision Making Problems Addressed: COPD exacerbation [facility]): acute illness or injury with systemic symptoms that poses a threat to life or bodily functions Wheezing: acute illness or injury with systemic symptoms that poses a threat to life or bodily functions  Amount and/or Complexity of Data Reviewed External Data Reviewed: notes. Labs: ordered. Decision-making details documented in ED Course. Radiology: ordered and independent interpretation performed. Decision-making details documented in ED Course. ECG/medicine tests: ordered and independent interpretation performed. Decision-making details documented in ED Course.  Risk OTC drugs. Prescription drug management. Decision regarding hospitalization.   Iv ns. Continuous pulse ox and cardiac monitoring. Labs ordered/sent. Imaging ordered.   Diff dx includes copd exacerbation, acute on chronic resp failure, bronchitis, pna, viral uri, etc - dispo decision including potential need for admission considered - will get labs and imaging and reassess.   Reviewed nursing notes and prior charts for additional history. External reports reviewed. Additional history from:  Cardiac monitor: sinus rhythm, rate 77.  Labs reviewed/interpreted by me -   Xrays reviewed/interpreted by me -   Albuterol and atrovent neb. Solumedrol iv.   Recheck wheezing improved but persists.   Additional albuterol tx. Recheck, wheezing now improved. Pt  feels improved. No cp. Breathing comfortably.  Pt  appears stable for d/c. Rec pcp f/u.  Return precautions provided.            Final Clinical Impression(s) / ED Diagnoses Final diagnoses:  COPD  exacerbation ([place])  Wheezing    Rx / DC Orders ED Discharge Orders          Ordered    predniSONE (DELTASONE) 20 MG tablet  Daily        03/17/22 1920    albuterol (VENTOLIN HFA) 108 (90 Base) MCG/ACT inhaler  Every 4 hours PRN        03/17/22 1920              Lajean Saver, MD 03/17/22 1921

## 2022-03-17 NOTE — Assessment & Plan Note (Signed)
HC03  01/17/22  = 32  - 02/01/2022   Walked on RA  X 3   lap(s) =  approx 450  ft  @ mod pace, stopped due to end of study  with lowest 02 sats 94% no sob   Defer w/u for noct need for 02 to Dr Juel Burrow office (per pt already in progress)  unless requested to eval here  F/u can be q 6 m, sooner if needed          Each maintenance medication was reviewed in detail including emphasizing most importantly the difference between maintenance and prns and under what circumstances the prns are to be triggered using an action plan format where appropriate.  Total time for H and P, chart review, counseling, reviewing hfa /neb/02 device(s) and generating customized AVS unique to this office visit / same day charting = 18mn

## 2022-03-17 NOTE — ED Triage Notes (Signed)
Sob  for a few days. Worsening last night. Coughing up clear sputum. New hctz but stopped on her on  96% RA CBG 116 146/78 BP HR 82 Hx anxiety (did not take med), A lot of stress at home with new dementia husband

## 2022-03-17 NOTE — Discharge Instructions (Addendum)
It was our pleasure to provide your ER care today - we hope that you feel better.  Take prednisone as prescribed.   Use albuterol treatment as need.   Avoid any smoking.  Follow up with primary care doctor in the coming week if symptoms fail to improve/resolve.  Return to ER if worse, new symptoms, fevers, chest pain, increased trouble breathing, or other concern.

## 2022-03-22 DIAGNOSIS — Z23 Encounter for immunization: Secondary | ICD-10-CM | POA: Diagnosis not present

## 2022-03-22 DIAGNOSIS — F411 Generalized anxiety disorder: Secondary | ICD-10-CM | POA: Diagnosis not present

## 2022-03-22 DIAGNOSIS — K5900 Constipation, unspecified: Secondary | ICD-10-CM | POA: Diagnosis not present

## 2022-03-22 DIAGNOSIS — J449 Chronic obstructive pulmonary disease, unspecified: Secondary | ICD-10-CM | POA: Diagnosis not present

## 2022-04-06 DIAGNOSIS — B370 Candidal stomatitis: Secondary | ICD-10-CM | POA: Diagnosis not present

## 2022-04-06 DIAGNOSIS — G4730 Sleep apnea, unspecified: Secondary | ICD-10-CM | POA: Diagnosis not present

## 2022-04-06 DIAGNOSIS — F411 Generalized anxiety disorder: Secondary | ICD-10-CM | POA: Diagnosis not present

## 2022-04-07 DIAGNOSIS — J449 Chronic obstructive pulmonary disease, unspecified: Secondary | ICD-10-CM | POA: Diagnosis not present

## 2022-04-07 DIAGNOSIS — J9610 Chronic respiratory failure, unspecified whether with hypoxia or hypercapnia: Secondary | ICD-10-CM | POA: Diagnosis not present

## 2022-04-09 DIAGNOSIS — J449 Chronic obstructive pulmonary disease, unspecified: Secondary | ICD-10-CM | POA: Diagnosis not present

## 2022-04-14 DIAGNOSIS — R490 Dysphonia: Secondary | ICD-10-CM | POA: Diagnosis not present

## 2022-04-14 DIAGNOSIS — R590 Localized enlarged lymph nodes: Secondary | ICD-10-CM | POA: Diagnosis not present

## 2022-04-14 DIAGNOSIS — J449 Chronic obstructive pulmonary disease, unspecified: Secondary | ICD-10-CM | POA: Diagnosis not present

## 2022-04-14 DIAGNOSIS — H9313 Tinnitus, bilateral: Secondary | ICD-10-CM | POA: Diagnosis not present

## 2022-04-14 DIAGNOSIS — J0110 Acute frontal sinusitis, unspecified: Secondary | ICD-10-CM | POA: Diagnosis not present

## 2022-04-16 ENCOUNTER — Emergency Department (HOSPITAL_COMMUNITY)
Admission: EM | Admit: 2022-04-16 | Discharge: 2022-04-16 | Discharge status: Against Medical Advice | Payer: PPO | Attending: Emergency Medicine | Admitting: Emergency Medicine

## 2022-04-16 ENCOUNTER — Other Ambulatory Visit: Payer: Self-pay

## 2022-04-16 ENCOUNTER — Encounter (HOSPITAL_COMMUNITY): Payer: Self-pay | Admitting: *Deleted

## 2022-04-16 DIAGNOSIS — R0602 Shortness of breath: Secondary | ICD-10-CM | POA: Diagnosis not present

## 2022-04-16 DIAGNOSIS — E663 Overweight: Secondary | ICD-10-CM | POA: Diagnosis not present

## 2022-04-16 DIAGNOSIS — Z5321 Procedure and treatment not carried out due to patient leaving prior to being seen by health care provider: Secondary | ICD-10-CM | POA: Insufficient documentation

## 2022-04-16 DIAGNOSIS — J029 Acute pharyngitis, unspecified: Secondary | ICD-10-CM | POA: Insufficient documentation

## 2022-04-16 DIAGNOSIS — Z6829 Body mass index (BMI) 29.0-29.9, adult: Secondary | ICD-10-CM | POA: Diagnosis not present

## 2022-04-16 NOTE — ED Triage Notes (Signed)
Pt with c/o sore throat and hoarseness since yesterday.  seen PCP and placed on antibiotics for enlarged lymph nodes in neck.  After starting the antibiotics pt with sore throat and hoarseness.  Denies any SOB at present

## 2022-04-16 NOTE — ED Provider Notes (Cosign Needed)
Patient eloped from the emergency department prior to my evaluation.  I did not see this patient.   Rhae Hammock, PA-C 04/16/22 1324

## 2022-04-16 NOTE — ED Notes (Signed)
Patient anxious and concerned about husband, ask nurse to see if he is sitting in the waiting area. Reported to patient that no one was sitting in the waiting area and once he arrives he will be brought to your room. Patient verbalized understanding.

## 2022-04-23 ENCOUNTER — Encounter (HOSPITAL_COMMUNITY): Payer: Self-pay

## 2022-04-23 ENCOUNTER — Emergency Department (HOSPITAL_COMMUNITY): Payer: PPO

## 2022-04-23 ENCOUNTER — Other Ambulatory Visit: Payer: Self-pay

## 2022-04-23 ENCOUNTER — Inpatient Hospital Stay (HOSPITAL_COMMUNITY)
Admission: EM | Admit: 2022-04-23 | Discharge: 2022-04-25 | DRG: 191 | Disposition: A | Discharge status: Home / Self Care | Payer: PPO | Attending: Internal Medicine | Admitting: Internal Medicine

## 2022-04-23 DIAGNOSIS — R739 Hyperglycemia, unspecified: Secondary | ICD-10-CM | POA: Diagnosis not present

## 2022-04-23 DIAGNOSIS — F4321 Adjustment disorder with depressed mood: Secondary | ICD-10-CM | POA: Diagnosis present

## 2022-04-23 DIAGNOSIS — Z7982 Long term (current) use of aspirin: Secondary | ICD-10-CM

## 2022-04-23 DIAGNOSIS — N179 Acute kidney failure, unspecified: Secondary | ICD-10-CM | POA: Diagnosis not present

## 2022-04-23 DIAGNOSIS — Z7952 Long term (current) use of systemic steroids: Secondary | ICD-10-CM | POA: Diagnosis not present

## 2022-04-23 DIAGNOSIS — E039 Hypothyroidism, unspecified: Secondary | ICD-10-CM | POA: Diagnosis not present

## 2022-04-23 DIAGNOSIS — Z7951 Long term (current) use of inhaled steroids: Secondary | ICD-10-CM | POA: Diagnosis not present

## 2022-04-23 DIAGNOSIS — Z91013 Allergy to seafood: Secondary | ICD-10-CM | POA: Diagnosis not present

## 2022-04-23 DIAGNOSIS — Z79899 Other long term (current) drug therapy: Secondary | ICD-10-CM | POA: Diagnosis not present

## 2022-04-23 DIAGNOSIS — R0602 Shortness of breath: Secondary | ICD-10-CM | POA: Diagnosis not present

## 2022-04-23 DIAGNOSIS — Z885 Allergy status to narcotic agent status: Secondary | ICD-10-CM | POA: Diagnosis not present

## 2022-04-23 DIAGNOSIS — Z8249 Family history of ischemic heart disease and other diseases of the circulatory system: Secondary | ICD-10-CM

## 2022-04-23 DIAGNOSIS — Z720 Tobacco use: Secondary | ICD-10-CM | POA: Diagnosis present

## 2022-04-23 DIAGNOSIS — J441 Chronic obstructive pulmonary disease with (acute) exacerbation: Secondary | ICD-10-CM | POA: Diagnosis not present

## 2022-04-23 DIAGNOSIS — E876 Hypokalemia: Secondary | ICD-10-CM | POA: Insufficient documentation

## 2022-04-23 DIAGNOSIS — Z888 Allergy status to other drugs, medicaments and biological substances status: Secondary | ICD-10-CM

## 2022-04-23 DIAGNOSIS — B351 Tinea unguium: Secondary | ICD-10-CM | POA: Diagnosis present

## 2022-04-23 DIAGNOSIS — K219 Gastro-esophageal reflux disease without esophagitis: Secondary | ICD-10-CM | POA: Diagnosis present

## 2022-04-23 DIAGNOSIS — F17210 Nicotine dependence, cigarettes, uncomplicated: Secondary | ICD-10-CM | POA: Diagnosis not present

## 2022-04-23 DIAGNOSIS — E559 Vitamin D deficiency, unspecified: Secondary | ICD-10-CM | POA: Diagnosis not present

## 2022-04-23 DIAGNOSIS — D72829 Elevated white blood cell count, unspecified: Secondary | ICD-10-CM | POA: Diagnosis not present

## 2022-04-23 DIAGNOSIS — Z20822 Contact with and (suspected) exposure to covid-19: Secondary | ICD-10-CM | POA: Diagnosis not present

## 2022-04-23 DIAGNOSIS — F419 Anxiety disorder, unspecified: Secondary | ICD-10-CM | POA: Diagnosis present

## 2022-04-23 DIAGNOSIS — E782 Mixed hyperlipidemia: Secondary | ICD-10-CM | POA: Diagnosis present

## 2022-04-23 DIAGNOSIS — Z833 Family history of diabetes mellitus: Secondary | ICD-10-CM

## 2022-04-23 DIAGNOSIS — Z789 Other specified health status: Secondary | ICD-10-CM

## 2022-04-23 LAB — BASIC METABOLIC PANEL
Anion gap: 7 (ref 5–15)
BUN: 12 mg/dL (ref 8–23)
CO2: 28 mmol/L (ref 22–32)
Calcium: 8.6 mg/dL — ABNORMAL LOW (ref 8.9–10.3)
Chloride: 102 mmol/L (ref 98–111)
Creatinine, Ser: 1.38 mg/dL — ABNORMAL HIGH (ref 0.44–1.00)
GFR, Estimated: 41 mL/min — ABNORMAL LOW (ref >60)
Glucose, Bld: 144 mg/dL — ABNORMAL HIGH (ref 70–99)
Potassium: 3.4 mmol/L — ABNORMAL LOW (ref 3.5–5.1)
Sodium: 137 mmol/L (ref 135–145)

## 2022-04-23 LAB — RESP PANEL BY RT-PCR (RSV, FLU A&B, COVID)  RVPGX2
Influenza A by PCR: NEGATIVE
Influenza B by PCR: NEGATIVE
Resp Syncytial Virus by PCR: NEGATIVE
SARS Coronavirus 2 by RT PCR: NEGATIVE

## 2022-04-23 LAB — CBC
HCT: 39 % (ref 36.0–46.0)
Hemoglobin: 13.3 g/dL (ref 12.0–15.0)
MCH: 31.9 pg (ref 26.0–34.0)
MCHC: 34.1 g/dL (ref 30.0–36.0)
MCV: 93.5 fL (ref 80.0–100.0)
Platelets: 337 10*3/uL (ref 150–400)
RBC: 4.17 MIL/uL (ref 3.87–5.11)
RDW: 13.9 % (ref 11.5–15.5)
WBC: 13.9 10*3/uL — ABNORMAL HIGH (ref 4.0–10.5)
nRBC: 0 % (ref 0.0–0.2)

## 2022-04-23 MED ORDER — ALBUTEROL SULFATE (2.5 MG/3ML) 0.083% IN NEBU
5.0000 mg | INHALATION_SOLUTION | Freq: Once | RESPIRATORY_TRACT | Status: AC
  Administered 2022-04-23: 5 mg via RESPIRATORY_TRACT

## 2022-04-23 MED ORDER — ALBUTEROL SULFATE (2.5 MG/3ML) 0.083% IN NEBU
INHALATION_SOLUTION | RESPIRATORY_TRACT | Status: AC
  Filled 2022-04-23: qty 6

## 2022-04-23 MED ORDER — ALBUTEROL SULFATE HFA 108 (90 BASE) MCG/ACT IN AERS: 2.0000 | INHALATION_SPRAY | RESPIRATORY_TRACT | Status: DC | PRN

## 2022-04-23 MED ORDER — METHYLPREDNISOLONE SODIUM SUCC 125 MG IJ SOLR
125.0000 mg | Freq: Once | INTRAMUSCULAR | Status: AC
  Administered 2022-04-23: 125 mg via INTRAVENOUS
  Filled 2022-04-23: qty 2

## 2022-04-23 MED ORDER — IPRATROPIUM-ALBUTEROL 0.5-2.5 (3) MG/3ML IN SOLN
3.0000 mL | Freq: Once | RESPIRATORY_TRACT | Status: AC
  Administered 2022-04-23: 3 mL via RESPIRATORY_TRACT
  Filled 2022-04-23: qty 3

## 2022-04-23 NOTE — ED Provider Notes (Signed)
[facility] EMERGENCY DEPARTMENT Provider Note   CSN: [id number] Arrival date & time: 04/23/22  2104     History  Chief Complaint  Patient presents with   Shortness of Breath    Lori Lopez is a 71 y.o. female.  Pt is a 71 yo female with pmhx significant for COPD, DDD, HLD, hypothyroidism, obesity, anxiety, and tobacco abuse.  Pt developed sob today.  She just finished a round of steroids yesterday.  She denies fever.         Home Medications Prior to Admission medications   Medication Sig Start Date End Date Taking? Authorizing Provider  albuterol (VENTOLIN HFA) 108 (90 Base) MCG/ACT inhaler 2 puffs every 4 (four) hours as needed for shortness of breath or wheezing. 09/21/21   [provider]  albuterol (VENTOLIN HFA) 108 (90 Base) MCG/ACT inhaler Inhale 2 puffs into the lungs every 4 (four) hours as needed for wheezing or shortness of breath. 03/17/22   Lajean Saver, MD  ALPRAZolam Duanne Moron) 1 MG tablet Take 0.5 mg by mouth daily as needed for anxiety. 02/10/16   [provider]  aspirin EC 81 MG tablet Take 81 mg by mouth daily. Swallow whole.    [provider]  Budeson-Glycopyrrol-Formoterol (BREZTRI AEROSPHERE IN) Inhale into the lungs.    [provider]  Cholecalciferol (VITAMIN D3) 1000 units CAPS Take 1,000 Units by mouth daily.    [provider]  gabapentin (NEURONTIN) 300 MG capsule Take 1 capsule (300 mg total) by mouth 2 (two) times daily. 01/19/22   Johnson, Clanford L, MD  guaiFENesin-dextromethorphan (ROBITUSSIN DM) 100-10 MG/5ML syrup Take 5 mLs by mouth every 4 (four) hours as needed for cough. 01/19/22   Johnson, Clanford L, MD  hydrochlorothiazide (HYDRODIURIL) 12.5 MG tablet Take 12.5 mg by mouth daily. Patient not taking: Reported on 03/17/2022 02/24/22   [provider]  ipratropium-albuterol (DUONEB) 0.5-2.5 (3) MG/3ML SOLN Take 3 mLs by nebulization every 4 (four) hours as needed. 01/19/22   Johnson,  Clanford L, MD  linaclotide (LINZESS) 72 MCG capsule Take 1 capsule (72 mcg total) by mouth daily as needed. 06/30/21   Mahala Menghini, PA-C  nystatin (MYCOSTATIN) 100000 UNIT/ML suspension Take 5 mLs by mouth 4 (four) times daily. Patient not taking: Reported on 03/17/2022 02/24/22   [provider]  pantoprazole (PROTONIX) 40 MG tablet Take 40 mg by mouth 2 (two) times daily.    [provider]  predniSONE (DELTASONE) 20 MG tablet Take 3 tablets (60 mg total) by mouth daily. 03/18/22   Lajean Saver, MD  terbinafine (LAMISIL) 1 % cream Apply 1 application topically daily as needed (athlete's foot).    [provider]      Allergies    Shrimp [shellfish allergy], Hydrocodone, and Lyrica [pregabalin]    Review of Systems   Review of Systems  Respiratory:  Positive for cough, shortness of breath and wheezing.   All other systems reviewed and are negative.   Physical Exam Updated Vital Signs BP (!) 96/47   Pulse 70   Temp 97.7 F (36.5 C) (Oral)   Resp 13   Ht '5\' 2"'$  (1.575 m)   Wt 72.6 kg   SpO2 95%   BMI 29.26 kg/m  Physical Exam Vitals and nursing note reviewed.  Constitutional:      Appearance: She is well-developed.  HENT:     Head: Normocephalic and atraumatic.     Mouth/Throat:     Mouth: Mucous membranes are moist.  Pharynx: Oropharynx is clear.  Eyes:     Extraocular Movements: Extraocular movements intact.     Pupils: Pupils are equal, round, and reactive to light.  Cardiovascular:     Rate and Rhythm: Normal rate and regular rhythm.  Pulmonary:     Effort: Tachypnea present.     Breath sounds: Wheezing present.  Abdominal:     General: Bowel sounds are normal.     Palpations: Abdomen is soft.  Musculoskeletal:        General: Normal range of motion.     Cervical back: Normal range of motion and neck supple.  Skin:    General: Skin is warm.     Capillary Refill: Capillary refill takes less than 2 seconds.  Neurological:      General: No focal deficit present.     Mental Status: She is alert and oriented to person, place, and time.  Psychiatric:        Mood and Affect: Mood normal.        Behavior: Behavior normal.     ED Results / Procedures / Treatments   Labs (all labs ordered are listed, but only abnormal results are displayed) Labs Reviewed  BASIC METABOLIC PANEL - Abnormal; Notable for the following components:      Result Value   Potassium 3.4 (*)    Glucose, Bld 144 (*)    Creatinine, Ser 1.38 (*)    Calcium 8.6 (*)    GFR, Estimated 41 (*)    All other components within normal limits  CBC - Abnormal; Notable for the following components:   WBC 13.9 (*)    All other components within normal limits  RESP PANEL BY RT-PCR (RSV, FLU A&B, COVID)  [id number]    EKG EKG Interpretation  Date/Time:  Saturday April 23 2022 21:12:33 EST Ventricular Rate:  76 PR Interval:  144 QRS Duration: 76 QT Interval:  374 QTC Calculation: 420 R Axis:   73 Text Interpretation: Normal sinus rhythm Nonspecific ST abnormality Abnormal ECG When compared with ECG of 17-Mar-2022 15:48, PREVIOUS ECG IS PRESENT No significant change since last tracing Confirmed by Isla Pence [phone]) on 04/23/2022 9:50:48 PM  Radiology DG Chest Port 1 View  Result Date: 04/23/2022 CLINICAL DATA:  Shortness of breath and labored breathing. EXAM: PORTABLE CHEST 1 VIEW COMPARISON:  Chest radiograph 03/17/2022 and earlier FINDINGS: The heart size and mediastinal contours are within normal limits. Aortic calcifications. Both lungs are clear. No pleural effusion or pneumothorax. Partially visualized anterior cervical spinal fixation. IMPRESSION: No acute cardiopulmonary abnormality. Electronically Signed   By: Ileana Roup M.D.   On: 04/23/2022 21:50    Procedures Procedures    Medications Ordered in ED Medications  albuterol (PROVENTIL) (2.5 MG/3ML) 0.083% nebulizer solution (  Canceled Entry 04/23/22 2126)  albuterol (PROVENTIL)  (2.5 MG/3ML) 0.083% nebulizer solution 5 mg (5 mg Nebulization Given 04/23/22 2127)  methylPREDNISolone sodium succinate (SOLU-MEDROL) 125 mg/2 mL injection 125 mg (125 mg Intravenous Given 04/23/22 2130)  ipratropium-albuterol (DUONEB) 0.5-2.5 (3) MG/3ML nebulizer solution 3 mL (3 mLs Nebulization Given 04/23/22 2233)    ED Course/ Medical Decision Making/ A&P                           Medical Decision Making Amount and/or Complexity of Data Reviewed Labs: ordered. Radiology: ordered.  Risk Prescription drug management. Decision regarding hospitalization.   This patient presents to the ED for concern of sob, this involves an extensive  number of treatment options, and is a complaint that carries with it a high risk of complications and morbidity.  The differential diagnosis includes copd, pna, bronchitis, covid/flu/rsv   Co morbidities that complicate the patient evaluation   COPD, DDD, HLD, hypothyroidism, obesity, anxiety, and tobacco abuse   Additional history obtained:  Additional history obtained from epic chart review External records from outside source obtained and reviewed including family   Lab Tests:  I Ordered, and personally interpreted labs.  The pertinent results include:  cbc with wbc elevated at 13.9, bmp nl other than cr elevated at 1.38; covid/flu/rsv neg   Imaging Studies ordered:  I ordered imaging studies including cxr  I independently visualized and interpreted imaging which showed No acute cardiopulmonary abnormality.  I agree with the radiologist interpretation   Cardiac Monitoring:  The patient was maintained on a cardiac monitor.  I personally viewed and interpreted the cardiac monitored which showed an underlying rhythm of: nsr   Medicines ordered and prescription drug management:  I ordered medication including solumedrol/nebs  for copd exac  Reevaluation of the patient after these medicines showed that the patient improved I have  reviewed the patients home medicines and have made adjustments as needed    Consultations Obtained:  I requested consultation with the hospitalist (Dr. Josephine Cables),  and discussed lab and imaging findings as well as pertinent plan - he will admit   Problem List / ED Course:  COPD exacerbation:  pt is given nebs and steroids.  She has improved, but she is still very sob with movement.  She has recently taken 2 rounds of abx and just finished a round of steroids, so she has failed outpatient fx.   Reevaluation:  After the interventions noted above, I reevaluated the patient and found that they have :improved   Social Determinants of Health:  Lives at home   Dispostion:  After consideration of the diagnostic results and the patients response to treatment, I feel that the patent would benefit from admission.          Final Clinical Impression(s) / ED Diagnoses Final diagnoses:  COPD exacerbation ([place])  Tobacco abuse  Failure of outpatient treatment    Rx / DC Orders ED Discharge Orders     None         Isla Pence, MD 04/23/22 2345

## 2022-04-23 NOTE — H&P (Signed)
History and Physical    Patient: Lori Lopez WNU:[id number] DOB: 1951-04-16 DOA: 04/23/2022 DOS: the patient was seen and examined on 04/24/2022 PCP: Celene Squibb, MD  Patient coming from: Home  Chief Complaint:  Chief Complaint  Patient presents with   Shortness of Breath   HPI: [name] is a 71 y.o. female with medical history significant of COPD, tobacco use, GERD who presents emergency department due to worsening shortness of breath.  She complained of worsening shortness of breath that has been ongoing for more than 1 week, she has been to her PCP and she had 2 rounds of antibiotic treatment and 1 round of steroid which she just completed yesterday.  She had a worsening shortness of breath and wheezing this afternoon, so she decided to go to the ED for further evaluation and management.  She endorsed occasional nonproductive cough, but denies fever, chills, nausea, vomiting, abdominal pain.  ED Course:  In the emergency department, temperature was 97.7 F, respiration 29/min, pulse 70 bpm, BP 92/50, O2 sats was 90% - 96%  on room air.  Workup in the ED showed normal CBC except for WBC of 13.9, BMP was normal except for hypokalemia, blood glucose 144, creatinine 1.38 (baseline creatinine 0.9-1.0).  Influenza A, B, SARS coronavirus 2 was negative. Chest x-ray showed no acute cardiopulmonary abnormality Breathing treatment was provided, Solu-Medrol 125 mg x 1 was given.  Hospitalist was asked to admit patient for further evaluation and management.  Review of Systems: Review of systems as noted in the HPI. All other systems reviewed and are negative.   Past Medical History:  Diagnosis Date   Adjustment disorder with depressed mood 09/08/2009   Allergic rhinitis due to pollen 09/08/2009   Anxiety    Cigarette nicotine dependence, uncomplicated 0/34/7425   COPD (chronic obstructive pulmonary disease) ([place]) 11/05/2010   Degeneration of thoracolumbar intervertebral disc 06/13/2013    Degeneration, intervertebral disc, cervicothoracic 06/13/2013   Hypothyroidism    Mixed hyperlipidemia 05/15/2013   Obesity due to excess calories 05/15/2013   Tinea unguium 05/15/2013   Vitamin D deficiency 12/28/2009   Past Surgical History:  Procedure Laterality Date   BACK SURGERY     BIOPSY  11/07/2017   Procedure: BIOPSY;  Surgeon: Danie Binder, MD;  Location: [facility];  Service: Endoscopy;;  gastric biopsy    CERVICAL DISC SURGERY     COLONOSCOPY  2008   Dr. Oneida Alar: normal colon, small internal hemorrhoids.    COLONOSCOPY N/A 04/08/2016   Dr. Oneida Alar: examined portion of ileum normal. two 5-6 mm polyps in descending colon and transverse colon, three 2-4 mm polyps in rectum, (all hyperplastic), internal and external hemorrhoids. Repeat in 10 years   COLONOSCOPY WITH PROPOFOL N/A 09/22/2020   Procedure: COLONOSCOPY WITH PROPOFOL;  Surgeon: Eloise Harman, DO;  Location: [facility];  Service: Endoscopy;  Laterality: N/A;  2:45pm   ESOPHAGOGASTRODUODENOSCOPY (EGD) WITH PROPOFOL N/A 11/07/2017   Dr. Oneida Alar: Normal esophagus status post dilation.  Mild gastritis with benign biopsies   POLYPECTOMY  09/22/2020   Procedure: POLYPECTOMY;  Surgeon: Eloise Harman, DO;  Location: [facility];  Service: Endoscopy;;   SAVORY DILATION N/A 11/07/2017   Procedure: SAVORY DILATION;  Surgeon: Danie Binder, MD;  Location: [facility];  Service: Endoscopy;  Laterality: N/A;    Social History:  reports that she has been smoking cigarettes. She has never used smokeless tobacco. She reports that she does not drink alcohol and does  not use drugs.   Allergies  Allergen Reactions   Shrimp [Shellfish Allergy] Anaphylaxis   Hydrocodone Hives and Itching   Lyrica [Pregabalin] Other (See Comments)    unknown    Family History  Problem Relation Age of Onset   Diabetes Mother    CAD Mother    Heart disease Mother    CAD Father    Colon cancer Neg Hx    Colon polyps Neg Hx      Prior to  Admission medications   Medication Sig Start Date End Date Taking? Authorizing Provider  albuterol (VENTOLIN HFA) 108 (90 Base) MCG/ACT inhaler 2 puffs every 4 (four) hours as needed for shortness of breath or wheezing. 09/21/21   [provider]  albuterol (VENTOLIN HFA) 108 (90 Base) MCG/ACT inhaler Inhale 2 puffs into the lungs every 4 (four) hours as needed for wheezing or shortness of breath. 03/17/22   Lajean Saver, MD  ALPRAZolam Duanne Moron) 1 MG tablet Take 0.5 mg by mouth daily as needed for anxiety. 02/10/16   [provider]  aspirin EC 81 MG tablet Take 81 mg by mouth daily. Swallow whole.    [provider]  Budeson-Glycopyrrol-Formoterol (BREZTRI AEROSPHERE IN) Inhale into the lungs.    [provider]  Cholecalciferol (VITAMIN D3) 1000 units CAPS Take 1,000 Units by mouth daily.    [provider]  gabapentin (NEURONTIN) 300 MG capsule Take 1 capsule (300 mg total) by mouth 2 (two) times daily. 01/19/22   Johnson, Clanford L, MD  guaiFENesin-dextromethorphan (ROBITUSSIN DM) 100-10 MG/5ML syrup Take 5 mLs by mouth every 4 (four) hours as needed for cough. 01/19/22   Johnson, Clanford L, MD  hydrochlorothiazide (HYDRODIURIL) 12.5 MG tablet Take 12.5 mg by mouth daily. Patient not taking: Reported on 03/17/2022 02/24/22   [provider]  ipratropium-albuterol (DUONEB) 0.5-2.5 (3) MG/3ML SOLN Take 3 mLs by nebulization every 4 (four) hours as needed. 01/19/22   Johnson, Clanford L, MD  linaclotide (LINZESS) 72 MCG capsule Take 1 capsule (72 mcg total) by mouth daily as needed. 06/30/21   Mahala Menghini, PA-C  nystatin (MYCOSTATIN) 100000 UNIT/ML suspension Take 5 mLs by mouth 4 (four) times daily. Patient not taking: Reported on 03/17/2022 02/24/22   [provider]  pantoprazole (PROTONIX) 40 MG tablet Take 40 mg by mouth 2 (two) times daily.    [provider]  predniSONE (DELTASONE) 20 MG tablet Take 3 tablets (60 mg total)  by mouth daily. 03/18/22   Lajean Saver, MD  terbinafine (LAMISIL) 1 % cream Apply 1 application topically daily as needed (athlete's foot).    [provider]    Physical Exam: BP (!) 116/57   Pulse 86   Temp 97.7 F (36.5 C) (Oral)   Resp 19   Ht '5\' 2"'$  (1.575 m)   Wt 72.6 kg   SpO2 99%   BMI 29.26 kg/m   General: 71 y.o. year-old female well developed well nourished in no acute distress.  Alert and oriented x3. HEENT: NCAT, EOMI Neck: Supple, trachea medial Cardiovascular: Regular rate and rhythm with no rubs or gallops.  No thyromegaly or JVD noted.  No lower extremity edema. 2/4 pulses in all 4 extremities. Respiratory: Tachypnea.  Decreased breath sounds with expiratory wheezing.   Abdomen: Soft, nontender nondistended with normal bowel sounds x4 quadrants. Muskuloskeletal: No cyanosis, clubbing or edema noted bilaterally Neuro: CN II-XII intact, strength 5/5 x 4, sensation, reflexes intact Skin: No ulcerative lesions noted or rashes Psychiatry: Judgement  and insight appear normal. Mood is appropriate for condition and setting          Labs on Admission:  Basic Metabolic Panel: Recent Labs  Lab 04/23/22 2123  NA 137  K 3.4*  CL 102  CO2 28  GLUCOSE 144*  BUN 12  CREATININE 1.38*  CALCIUM 8.6*   Liver Function Tests: No results for input(s): "AST", "ALT", "ALKPHOS", "BILITOT", "PROT", "ALBUMIN" in the last 168 hours. No results for input(s): "LIPASE", "AMYLASE" in the last 168 hours. No results for input(s): "AMMONIA" in the last 168 hours. CBC: Recent Labs  Lab 04/23/22 2123  WBC 13.9*  HGB 13.3  HCT 39.0  MCV 93.5  PLT 337   Cardiac Enzymes: No results for input(s): "CKTOTAL", "CKMB", "CKMBINDEX", "TROPONINI" in the last 168 hours.  BNP (last 3 results) Recent Labs    03/17/22 1512  BNP 44.0    ProBNP (last 3 results) No results for input(s): "PROBNP" in the last 8760 hours.  CBG: No results for input(s): "GLUCAP" in the last 168  hours.  Radiological Exams on Admission: DG Chest Port 1 View  Result Date: 04/23/2022 CLINICAL DATA:  Shortness of breath and labored breathing. EXAM: PORTABLE CHEST 1 VIEW COMPARISON:  Chest radiograph 03/17/2022 and earlier FINDINGS: The heart size and mediastinal contours are within normal limits. Aortic calcifications. Both lungs are clear. No pleural effusion or pneumothorax. Partially visualized anterior cervical spinal fixation. IMPRESSION: No acute cardiopulmonary abnormality. Electronically Signed   By: Ileana Roup M.D.   On: 04/23/2022 21:50    EKG: I independently viewed the EKG done and my findings are as followed: Normal sinus rhythm at a rate of 73 bpm  Assessment/Plan Present on Admission:  Acute exacerbation of chronic obstructive pulmonary disease (COPD) (HCC)  Leukocytosis  Hyperglycemia  Tobacco abuse  GERD (gastroesophageal reflux disease)  Principal Problem:   Acute exacerbation of chronic obstructive pulmonary disease (COPD) ([facility]) Active Problems:   GERD (gastroesophageal reflux disease)   Hyperglycemia   Leukocytosis   Tobacco abuse   Hypokalemia   AKI (acute kidney injury) ([place])  Acute exacerbation of COPD Continue duo nebs, Mucinex, Solu-Medrol, azithromycin. Continue Protonix to prevent steroid-induced ulcer Continue incentive spirometry and flutter valve  Leukocytosis possibly secondary to recent steroid effect WBC 13.9.  Patient just completed a round of steroids yesterday There was no obvious acute infectious process noted at this time Continue to monitor WBC with morning labs  Hypokalemia K+ 3.4, this will be replenished  Acute kidney injury Creatinine 1.38 (baseline creatinine 0.9-1.0). Continue gentle hydration Renally adjust medications, avoid nephrotoxic agents/dehydration/hypotension  Hyperglycemia possibly due to reactive process Blood glucose 144, this may be due to recent steroid use by patient, she has no history of type 2  diabetes mellitus Continue to monitor blood glucose with morning labs  GERD Continue Protonix  Tobacco abuse Patient was counseled on tobacco abuse cessation  DVT prophylaxis: Lovenox  Code Status: Full code  Family Communication: None at bedside  Consults: None  Severity of Illness: The appropriate patient status for this patient is OBSERVATION. Observation status is judged to be reasonable and necessary in order to provide the required intensity of service to ensure the patient's safety. The patient's presenting symptoms, physical exam findings, and initial radiographic and laboratory data in the context of their medical condition is felt to place them at decreased risk for further clinical deterioration. Furthermore, it is anticipated that the patient will be medically stable for discharge from the hospital within 2  midnights of admission.   Author: Bernadette Hoit, DO 04/24/2022 12:50 AM  For on call review [email or web address].

## 2022-04-23 NOTE — ED Triage Notes (Signed)
Pt presents with ShOB and labored breathing that started today. Pt states yesterday she started having some nasal congestion. Denies fever. Pt with hx of COPD.

## 2022-04-23 NOTE — ED Notes (Signed)
RT and EDP at bedside 

## 2022-04-24 DIAGNOSIS — Z7952 Long term (current) use of systemic steroids: Secondary | ICD-10-CM | POA: Diagnosis not present

## 2022-04-24 DIAGNOSIS — E876 Hypokalemia: Secondary | ICD-10-CM | POA: Diagnosis present

## 2022-04-24 DIAGNOSIS — F17210 Nicotine dependence, cigarettes, uncomplicated: Secondary | ICD-10-CM | POA: Diagnosis present

## 2022-04-24 DIAGNOSIS — Z79899 Other long term (current) drug therapy: Secondary | ICD-10-CM | POA: Diagnosis not present

## 2022-04-24 DIAGNOSIS — E782 Mixed hyperlipidemia: Secondary | ICD-10-CM | POA: Diagnosis present

## 2022-04-24 DIAGNOSIS — N179 Acute kidney failure, unspecified: Secondary | ICD-10-CM | POA: Insufficient documentation

## 2022-04-24 DIAGNOSIS — D72829 Elevated white blood cell count, unspecified: Secondary | ICD-10-CM | POA: Diagnosis present

## 2022-04-24 DIAGNOSIS — B351 Tinea unguium: Secondary | ICD-10-CM | POA: Diagnosis present

## 2022-04-24 DIAGNOSIS — R739 Hyperglycemia, unspecified: Secondary | ICD-10-CM | POA: Diagnosis present

## 2022-04-24 DIAGNOSIS — Z91013 Allergy to seafood: Secondary | ICD-10-CM | POA: Diagnosis not present

## 2022-04-24 DIAGNOSIS — K219 Gastro-esophageal reflux disease without esophagitis: Secondary | ICD-10-CM | POA: Diagnosis present

## 2022-04-24 DIAGNOSIS — Z888 Allergy status to other drugs, medicaments and biological substances status: Secondary | ICD-10-CM | POA: Diagnosis not present

## 2022-04-24 DIAGNOSIS — Z7951 Long term (current) use of inhaled steroids: Secondary | ICD-10-CM | POA: Diagnosis not present

## 2022-04-24 DIAGNOSIS — E559 Vitamin D deficiency, unspecified: Secondary | ICD-10-CM | POA: Diagnosis present

## 2022-04-24 DIAGNOSIS — Z7982 Long term (current) use of aspirin: Secondary | ICD-10-CM | POA: Diagnosis not present

## 2022-04-24 DIAGNOSIS — Z833 Family history of diabetes mellitus: Secondary | ICD-10-CM | POA: Diagnosis not present

## 2022-04-24 DIAGNOSIS — Z8249 Family history of ischemic heart disease and other diseases of the circulatory system: Secondary | ICD-10-CM | POA: Diagnosis not present

## 2022-04-24 DIAGNOSIS — J441 Chronic obstructive pulmonary disease with (acute) exacerbation: Secondary | ICD-10-CM | POA: Diagnosis present

## 2022-04-24 DIAGNOSIS — F4321 Adjustment disorder with depressed mood: Secondary | ICD-10-CM | POA: Diagnosis present

## 2022-04-24 DIAGNOSIS — R0602 Shortness of breath: Secondary | ICD-10-CM | POA: Diagnosis present

## 2022-04-24 DIAGNOSIS — F419 Anxiety disorder, unspecified: Secondary | ICD-10-CM | POA: Diagnosis present

## 2022-04-24 DIAGNOSIS — Z885 Allergy status to narcotic agent status: Secondary | ICD-10-CM | POA: Diagnosis not present

## 2022-04-24 DIAGNOSIS — Z20822 Contact with and (suspected) exposure to covid-19: Secondary | ICD-10-CM | POA: Diagnosis present

## 2022-04-24 DIAGNOSIS — E039 Hypothyroidism, unspecified: Secondary | ICD-10-CM | POA: Diagnosis present

## 2022-04-24 LAB — COMPREHENSIVE METABOLIC PANEL
ALT: 16 U/L (ref 0–44)
AST: 20 U/L (ref 15–41)
Albumin: 3.6 g/dL (ref 3.5–5.0)
Alkaline Phosphatase: 50 U/L (ref 38–126)
Anion gap: 9 (ref 5–15)
BUN: 14 mg/dL (ref 8–23)
CO2: 28 mmol/L (ref 22–32)
Calcium: 8.8 mg/dL — ABNORMAL LOW (ref 8.9–10.3)
Chloride: 103 mmol/L (ref 98–111)
Creatinine, Ser: 1.13 mg/dL — ABNORMAL HIGH (ref 0.44–1.00)
GFR, Estimated: 52 mL/min — ABNORMAL LOW (ref >60)
Glucose, Bld: 200 mg/dL — ABNORMAL HIGH (ref 70–99)
Potassium: 4.5 mmol/L (ref 3.5–5.1)
Sodium: 140 mmol/L (ref 135–145)
Total Bilirubin: 0.3 mg/dL (ref 0.3–1.2)
Total Protein: 6.4 g/dL — ABNORMAL LOW (ref 6.5–8.1)

## 2022-04-24 LAB — CBC
HCT: 38.1 % (ref 36.0–46.0)
Hemoglobin: 12.7 g/dL (ref 12.0–15.0)
MCH: 31.4 pg (ref 26.0–34.0)
MCHC: 33.3 g/dL (ref 30.0–36.0)
MCV: 94.1 fL (ref 80.0–100.0)
Platelets: 328 10*3/uL (ref 150–400)
RBC: 4.05 MIL/uL (ref 3.87–5.11)
RDW: 13.9 % (ref 11.5–15.5)
WBC: 12.4 10*3/uL — ABNORMAL HIGH (ref 4.0–10.5)
nRBC: 0 % (ref 0.0–0.2)

## 2022-04-24 LAB — MAGNESIUM: Magnesium: 2.4 mg/dL (ref 1.7–2.4)

## 2022-04-24 LAB — PHOSPHORUS: Phosphorus: 3.6 mg/dL (ref 2.5–4.6)

## 2022-04-24 MED ORDER — ACETAMINOPHEN 325 MG PO TABS
650.0000 mg | ORAL_TABLET | Freq: Four times a day (QID) | ORAL | Status: DC | PRN
  Administered 2022-04-24: 650 mg via ORAL
  Filled 2022-04-24: qty 2

## 2022-04-24 MED ORDER — POTASSIUM CHLORIDE CRYS ER 20 MEQ PO TBCR
40.0000 meq | EXTENDED_RELEASE_TABLET | Freq: Once | ORAL | Status: AC
  Administered 2022-04-24: 40 meq via ORAL
  Filled 2022-04-24: qty 2

## 2022-04-24 MED ORDER — IPRATROPIUM-ALBUTEROL 0.5-2.5 (3) MG/3ML IN SOLN
3.0000 mL | Freq: Four times a day (QID) | RESPIRATORY_TRACT | Status: DC
  Administered 2022-04-24 – 2022-04-25 (×5): 3 mL via RESPIRATORY_TRACT
  Filled 2022-04-24 (×5): qty 3

## 2022-04-24 MED ORDER — ACETAMINOPHEN 650 MG RE SUPP: 650.0000 mg | Freq: Four times a day (QID) | RECTAL | Status: DC | PRN

## 2022-04-24 MED ORDER — AZITHROMYCIN 250 MG PO TABS
500.0000 mg | ORAL_TABLET | Freq: Every day | ORAL | Status: AC
  Administered 2022-04-24: 500 mg via ORAL
  Filled 2022-04-24: qty 2

## 2022-04-24 MED ORDER — AZITHROMYCIN 250 MG PO TABS
250.0000 mg | ORAL_TABLET | Freq: Every day | ORAL | Status: DC
  Administered 2022-04-25: 250 mg via ORAL
  Filled 2022-04-24: qty 1

## 2022-04-24 MED ORDER — METHYLPREDNISOLONE SODIUM SUCC 40 MG IJ SOLR
40.0000 mg | Freq: Two times a day (BID) | INTRAMUSCULAR | Status: DC
  Administered 2022-04-24 – 2022-04-25 (×3): 40 mg via INTRAVENOUS
  Filled 2022-04-24 (×3): qty 1

## 2022-04-24 MED ORDER — PANTOPRAZOLE SODIUM 40 MG PO TBEC
40.0000 mg | DELAYED_RELEASE_TABLET | Freq: Every day | ORAL | Status: DC
  Administered 2022-04-24 – 2022-04-25 (×2): 40 mg via ORAL
  Filled 2022-04-24 (×2): qty 1

## 2022-04-24 MED ORDER — SODIUM CHLORIDE 0.9 % IV SOLN: INTRAVENOUS | Status: AC

## 2022-04-24 MED ORDER — DM-GUAIFENESIN ER 30-600 MG PO TB12
1.0000 | ORAL_TABLET | Freq: Two times a day (BID) | ORAL | Status: DC
  Administered 2022-04-24 – 2022-04-25 (×4): 1 via ORAL
  Filled 2022-04-24 (×4): qty 1

## 2022-04-24 MED ORDER — ENOXAPARIN SODIUM 40 MG/0.4ML IJ SOSY
40.0000 mg | PREFILLED_SYRINGE | INTRAMUSCULAR | Status: DC
  Administered 2022-04-24: 40 mg via SUBCUTANEOUS
  Filled 2022-04-24 (×2): qty 0.4

## 2022-04-24 MED ORDER — ONDANSETRON HCL 4 MG PO TABS: 4.0000 mg | ORAL_TABLET | Freq: Four times a day (QID) | ORAL | Status: DC | PRN

## 2022-04-24 MED ORDER — IPRATROPIUM-ALBUTEROL 0.5-2.5 (3) MG/3ML IN SOLN
3.0000 mL | RESPIRATORY_TRACT | Status: DC | PRN
  Administered 2022-04-24 – 2022-04-25 (×2): 3 mL via RESPIRATORY_TRACT
  Filled 2022-04-24 (×2): qty 3

## 2022-04-24 MED ORDER — ONDANSETRON HCL 4 MG/2ML IJ SOLN: 4.0000 mg | Freq: Four times a day (QID) | INTRAMUSCULAR | Status: DC | PRN

## 2022-04-24 NOTE — Progress Notes (Signed)
PROGRESS NOTE    Lori Lopez  DQQ:[id number] DOB: Oct 17, 1950 DOA: 04/23/2022 PCP: Celene Squibb, MD   Brief Narrative:    Lori Lopez is a 71 y.o. female with medical history significant of COPD, tobacco use, GERD who presents emergency department due to worsening shortness of breath.  She complained of worsening shortness of breath that has been ongoing for more than 1 week, she has been to her PCP and she had 2 rounds of antibiotic treatment and 1 round of steroid which she just completed yesterday.  She was admitted for further evaluation and treatment of acute COPD exacerbation.  Assessment & Plan:   Principal Problem:   Acute exacerbation of chronic obstructive pulmonary disease (COPD) (HCC) Active Problems:   GERD (gastroesophageal reflux disease)   Hyperglycemia   Leukocytosis   Tobacco abuse   Hypokalemia   AKI (acute kidney injury) ([place])  Assessment and Plan:   Acute exacerbation of COPD Continue duo nebs, Mucinex, Solu-Medrol, azithromycin. Continue Protonix to prevent steroid-induced ulcer Continue incentive spirometry and flutter valve   Leukocytosis possibly secondary to recent steroid effect WBC 13.9.  Patient just completed a round of steroids yesterday There was no obvious acute infectious process noted at this time Continue to monitor WBC with morning labs   Acute kidney injury-improving Creatinine 1.38 (baseline creatinine 0.9-1.0). Continue gentle hydration, time-limited Renally adjust medications, avoid nephrotoxic agents/dehydration/hypotension   Hyperglycemia possibly due to reactive process Blood glucose 144, this may be due to recent steroid use by patient, she has no history of type 2 diabetes mellitus Continue to monitor blood glucose with morning labs Add SSI and check hemoglobin A1c   GERD Continue Protonix   Tobacco abuse Patient was counseled on tobacco abuse cessation   DVT prophylaxis: Lovenox Code Status: Full Family  Communication: None at bedside Disposition Plan:  Status is: Observation The patient will require care spanning > 2 midnights and should be moved to inpatient because: Need for IV antibiotics and IV medications.  Consultants:  None  Procedures:  None  Antimicrobials:  Anti-infectives (From admission, onward)    Start     Dose/Rate Route Frequency Ordered Stop   04/25/22 1000  azithromycin (ZITHROMAX) tablet 250 mg       See Hyperspace for full Linked Orders Report.   250 mg Oral Daily 04/24/22 0036 04/29/22 0959   04/24/22 1000  azithromycin (ZITHROMAX) tablet 500 mg       See Hyperspace for full Linked Orders Report.   500 mg Oral Daily 04/24/22 0036 04/24/22 1037      Subjective: Patient seen and evaluated today with no new acute complaints or concerns. No acute concerns or events noted overnight.  Objective: Vitals:   04/24/22 0849 04/24/22 0920 04/24/22 0930 04/24/22 1000  BP:   (!) 100/48 (!) 97/50  Pulse:   91 99  Resp:   17 (!) 23  Temp:  97.7 F (36.5 C)    TempSrc:  Oral    SpO2: 92%  94% 96%  Weight:      Height:        Intake/Output Summary (Last 24 hours) at 04/24/2022 1051 Last data filed at 04/24/2022 1941 Gross per 24 hour  Intake 413.75 ml  Output --  Net 413.75 ml   Filed Weights   04/23/22 2111  Weight: 72.6 kg    Examination:  General exam: Appears calm and comfortable  Respiratory system: Clear to auscultation. Respiratory effort normal.  2 L nasal cannula Cardiovascular system:  S1 & S2 heard, RRR.  Gastrointestinal system: Abdomen is soft Central nervous system: Alert and awake Extremities: No edema Skin: No significant lesions noted Psychiatry: Flat affect.    Data Reviewed: I have personally reviewed following labs and imaging studies  CBC: Recent Labs  Lab 04/23/22 2123 04/24/22 0405  WBC 13.9* 12.4*  HGB 13.3 12.7  HCT 39.0 38.1  MCV 93.5 94.1  PLT 337 751   Basic Metabolic Panel: Recent Labs  Lab 04/23/22 2123  04/24/22 0405  NA 137 140  K 3.4* 4.5  CL 102 103  CO2 28 28  GLUCOSE 144* 200*  BUN 12 14  CREATININE 1.38* 1.13*  CALCIUM 8.6* 8.8*  MG  --  2.4  PHOS  --  3.6   GFR: Estimated Creatinine Clearance: 43.2 mL/min (A) (by C-G formula based on SCr of 1.13 mg/dL (H)). Liver Function Tests: Recent Labs  Lab 04/24/22 0405  AST 20  ALT 16  ALKPHOS 50  BILITOT 0.3  PROT 6.4*  ALBUMIN 3.6   No results for input(s): "LIPASE", "AMYLASE" in the last 168 hours. No results for input(s): "AMMONIA" in the last 168 hours. Coagulation Profile: No results for input(s): "INR", "PROTIME" in the last 168 hours. Cardiac Enzymes: No results for input(s): "CKTOTAL", "CKMB", "CKMBINDEX", "TROPONINI" in the last 168 hours. BNP (last 3 results) No results for input(s): "PROBNP" in the last 8760 hours. HbA1C: No results for input(s): "HGBA1C" in the last 72 hours. CBG: No results for input(s): "GLUCAP" in the last 168 hours. Lipid Profile: No results for input(s): "CHOL", "HDL", "LDLCALC", "TRIG", "CHOLHDL", "LDLDIRECT" in the last 72 hours. Thyroid Function Tests: No results for input(s): "TSH", "T4TOTAL", "FREET4", "T3FREE", "THYROIDAB" in the last 72 hours. Anemia Panel: No results for input(s): "VITAMINB12", "FOLATE", "FERRITIN", "TIBC", "IRON", "RETICCTPCT" in the last 72 hours. Sepsis Labs: No results for input(s): "PROCALCITON", "LATICACIDVEN" in the last 168 hours.  Recent Results (from the past 240 hour(s))  Resp panel by RT-PCR (RSV, Flu A&B, Covid) Anterior Nasal Swab     Status: None   Collection Time: 04/23/22  9:14 PM   Specimen: Anterior Nasal Swab  Result Value Ref Range Status   SARS Coronavirus 2 by RT PCR NEGATIVE NEGATIVE Final    Comment: (NOTE) SARS-CoV-2 target nucleic acids are NOT DETECTED.  The SARS-CoV-2 RNA is generally detectable in upper respiratory specimens during the acute phase of infection. The lowest concentration of SARS-CoV-2 viral copies this assay  can detect is 138 copies/mL. A negative result does not preclude SARS-Cov-2 infection and should not be used as the sole basis for treatment or other patient management decisions. A negative result may occur with  improper specimen collection/handling, submission of specimen other than nasopharyngeal swab, presence of viral mutation(s) within the areas targeted by this assay, and inadequate number of viral copies(<138 copies/mL). A negative result must be combined with clinical observations, patient history, and epidemiological information. The expected result is Negative.  Fact Sheet for Patients:  EntrepreneurPulse.com.au  Fact Sheet for Healthcare Providers:  IncredibleEmployment.be  This test is no t yet approved or cleared by the [place] FDA and  has been authorized for detection and/or diagnosis of SARS-CoV-2 by FDA under an Emergency Use Authorization (EUA). This EUA will remain  in effect (meaning this test can be used) for the duration of the COVID-19 declaration under Section 564(b)(1) of the Act, 21 U.S.C.section 360bbb-3(b)(1), unless the authorization is terminated  or revoked sooner.       Influenza  A by PCR NEGATIVE NEGATIVE Final   Influenza B by PCR NEGATIVE NEGATIVE Final    Comment: (NOTE) The Xpert Xpress SARS-CoV-2/FLU/RSV plus assay is intended as an aid in the diagnosis of influenza from Nasopharyngeal swab specimens and should not be used as a sole basis for treatment. Nasal washings and aspirates are unacceptable for Xpert Xpress SARS-CoV-2/FLU/RSV testing.  Fact Sheet for Patients: EntrepreneurPulse.com.au  Fact Sheet for Healthcare Providers: IncredibleEmployment.be  This test is not yet approved or cleared by the [place] FDA and has been authorized for detection and/or diagnosis of SARS-CoV-2 by FDA under an Emergency Use Authorization (EUA). This EUA will  remain in effect (meaning this test can be used) for the duration of the COVID-19 declaration under Section 564(b)(1) of the Act, 21 U.S.C. section 360bbb-3(b)(1), unless the authorization is terminated or revoked.     Resp Syncytial Virus by PCR NEGATIVE NEGATIVE Final    Comment: (NOTE) Fact Sheet for Patients: EntrepreneurPulse.com.au  Fact Sheet for Healthcare Providers: IncredibleEmployment.be  This test is not yet approved or cleared by the [place] FDA and has been authorized for detection and/or diagnosis of SARS-CoV-2 by FDA under an Emergency Use Authorization (EUA). This EUA will remain in effect (meaning this test can be used) for the duration of the COVID-19 declaration under Section 564(b)(1) of the Act, 21 U.S.C. section 360bbb-3(b)(1), unless the authorization is terminated or revoked.  Performed at [facility], [address] [place], [place] [address]          Radiology Studies: [facility] Chest [facility] 1 View  Result Date: 04/23/2022 CLINICAL DATA:  Shortness of breath and labored breathing. EXAM: PORTABLE CHEST 1 VIEW COMPARISON:  Chest radiograph 03/17/2022 and earlier FINDINGS: The heart size and mediastinal contours are within normal limits. Aortic calcifications. Both lungs are clear. No pleural effusion or pneumothorax. Partially visualized anterior cervical spinal fixation. IMPRESSION: No acute cardiopulmonary abnormality. Electronically Signed   By: Ileana Roup M.D.   On: 04/23/2022 21:50        Scheduled Meds:  [START ON 04/25/2022] azithromycin  250 mg Oral Daily   dextromethorphan-guaiFENesin  1 tablet Oral BID   enoxaparin (LOVENOX) injection  40 mg Subcutaneous Q24H   ipratropium-albuterol  3 mL Nebulization Q6H   methylPREDNISolone (SOLU-MEDROL) injection  40 mg Intravenous Q12H   pantoprazole  40 mg Oral Daily   Continuous Infusions:  sodium chloride 75 mL/hr at 04/24/22 0827     LOS: 0 days     Time spent: 35 minutes    Keira Bohlin Darleen Crocker, DO [facility]  If 7PM-7AM, please contact night-coverage [email or web address] 04/24/2022, 10:51 AM

## 2022-04-24 NOTE — ED Notes (Signed)
Pt ambulatory to bathroom with no assistance needed. NAD noted.

## 2022-04-24 NOTE — TOC Progression Note (Signed)
Transition of Care [facility]) - Progression Note    Patient Details  Name: Lori Lopez MRN: [id number] Date of Birth: [birth year]  Transition of Care [facility]) CM/SW Contact  Salome Arnt, [place] Phone Number: 04/24/2022, 10:59 AM  Clinical Narrative:   Transition of Care [facility]) Screening Note   Patient Details  Name: Lori Lopez Date of Birth: 1950-06-13   Transition of Care [facility]) CM/SW Contact:    Salome Arnt, [place] Phone Number: 04/24/2022, 10:59 AM    Transition of Care Department [facility]) has reviewed patient and no TOC needs have been identified at this time. We will continue to monitor patient advancement through interdisciplinary progression rounds. If new patient transition needs arise, please place a TOC consult.         Barriers to Discharge: Continued Medical Work up  Expected Discharge Plan and Services                                                 Social Determinants of Health (SDOH) Interventions    Readmission Risk Interventions     No data to display

## 2022-04-24 NOTE — ED Notes (Signed)
Pt was given lunch tray.  

## 2022-04-24 NOTE — Plan of Care (Signed)
The patient is admitted from [facility]  ED to level 300. A & O x 4. The patient was oriented to the room, staff ad ascom. Full assessment to epic completed. Will continue to monitor.

## 2022-04-24 NOTE — ED Notes (Signed)
Pt ambulated to the bathroom independently.

## 2022-04-25 DIAGNOSIS — J441 Chronic obstructive pulmonary disease with (acute) exacerbation: Secondary | ICD-10-CM | POA: Diagnosis not present

## 2022-04-25 LAB — MAGNESIUM: Magnesium: 2.3 mg/dL (ref 1.7–2.4)

## 2022-04-25 LAB — CBC
HCT: 38.5 % (ref 36.0–46.0)
Hemoglobin: 12.7 g/dL (ref 12.0–15.0)
MCH: 31 pg (ref 26.0–34.0)
MCHC: 33 g/dL (ref 30.0–36.0)
MCV: 93.9 fL (ref 80.0–100.0)
Platelets: 373 10*3/uL (ref 150–400)
RBC: 4.1 MIL/uL (ref 3.87–5.11)
RDW: 14.4 % (ref 11.5–15.5)
WBC: 19.2 10*3/uL — ABNORMAL HIGH (ref 4.0–10.5)
nRBC: 0 % (ref 0.0–0.2)

## 2022-04-25 LAB — HEMOGLOBIN A1C
Hgb A1c MFr Bld: 5.8 % — ABNORMAL HIGH (ref 4.8–5.6)
Mean Plasma Glucose: 120 mg/dL

## 2022-04-25 LAB — BASIC METABOLIC PANEL
Anion gap: 7 (ref 5–15)
BUN: 16 mg/dL (ref 8–23)
CO2: 28 mmol/L (ref 22–32)
Calcium: 9 mg/dL (ref 8.9–10.3)
Chloride: 106 mmol/L (ref 98–111)
Creatinine, Ser: 0.89 mg/dL (ref 0.44–1.00)
GFR, Estimated: >60 mL/min (ref >60)
Glucose, Bld: 136 mg/dL — ABNORMAL HIGH (ref 70–99)
Potassium: 4.4 mmol/L (ref 3.5–5.1)
Sodium: 141 mmol/L (ref 135–145)

## 2022-04-25 MED ORDER — FLUTICASONE-SALMETEROL 250-50 MCG/ACT IN AEPB: 1.0000 | INHALATION_SPRAY | Freq: Two times a day (BID) | RESPIRATORY_TRACT | 0 refills | Status: DC

## 2022-04-25 MED ORDER — AZITHROMYCIN 250 MG PO TABS: 250.0000 mg | ORAL_TABLET | Freq: Every day | ORAL | 0 refills | Status: AC

## 2022-04-25 MED ORDER — PREDNISONE 10 MG PO TABS: 40.0000 mg | ORAL_TABLET | Freq: Every day | ORAL | 0 refills | Status: AC

## 2022-04-25 MED ORDER — ALBUTEROL SULFATE (2.5 MG/3ML) 0.083% IN NEBU
INHALATION_SOLUTION | RESPIRATORY_TRACT | Status: AC
  Administered 2022-04-25: 2.5 mg
  Filled 2022-04-25: qty 3

## 2022-04-25 MED ORDER — MENTHOL 3 MG MT LOZG
1.0000 | LOZENGE | OROMUCOSAL | Status: DC | PRN
  Filled 2022-04-25: qty 9

## 2022-04-25 NOTE — Discharge Summary (Signed)
Physician Discharge Summary  Lori Lopez AST:[id number] DOB: [birth year] DOA: 04/23/2022  PCP: Celene Squibb, MD  Admit date: 04/23/2022  Discharge date: 04/25/2022  Admitted From:Home  Disposition:  Home  Recommendations for Outpatient Follow-up:  Follow up with PCP in 1-2 weeks Follow-up with pulmonology Dr. Melvyn Novas in the next 1 week as recommended Patient wants to try Advair and therefore this will be prescribed and she will discontinue Breztri because she doesn't like it Continue on azithromycin and prednisone as prescribed to complete course of treatment Use home inhalers as needed for shortness of breath or wheezing  Home Health: None  Equipment/Devices: None  Discharge Condition:Stable  CODE STATUS: Full  Diet recommendation: Heart Healthy  Brief/Interim Summary: Lori Lopez is a 71 y.o. female with medical history significant of COPD, tobacco use, GERD who presents emergency department due to worsening shortness of breath.  She complained of worsening shortness of breath that has been ongoing for more than 1 week, she has been to her PCP and she had 2 rounds of antibiotic treatment and 1 round of steroid which she just completed yesterday.  She was admitted for further evaluation and treatment of acute COPD exacerbation.  She remained on nebulizer breathing treatments as well as IV steroids during the course of her short stay.  She has been weaned off of oxygen and feels well enough for discharge.  No further shortness of breath or wheezing or chest tightness noted.  She will remain on azithromycin and prednisone as prescribed as well as on Advair which she requested.  She is agreeable to following up with pulmonology outpatient in the very near future.  Discharge Diagnoses:  Principal Problem:   Acute exacerbation of chronic obstructive pulmonary disease (COPD) ([place]) Active Problems:   GERD (gastroesophageal reflux disease)   COPD with acute exacerbation (H[facility])    Hyperglycemia   Leukocytosis   Tobacco abuse   Hypokalemia   AKI (acute kidney injury) ([place])  Principal discharge diagnosis: Acute COPD exacerbation.  Mild AKI-resolved.  Discharge Instructions  Discharge Instructions     Diet - low sodium heart healthy   Complete by: As directed    Increase activity slowly   Complete by: As directed       Allergies as of 04/25/2022       Reactions   Shrimp [shellfish Allergy] Anaphylaxis   Hydrocodone Hives, Itching   Lyrica [pregabalin] Other (See Comments)   unknown        Medication List     STOP taking these medications    hydrochlorothiazide 12.5 MG tablet Commonly known as: HYDRODIURIL       TAKE these medications    albuterol 108 (90 Base) MCG/ACT inhaler Commonly known as: VENTOLIN HFA 2 puffs every 4 (four) hours as needed for shortness of breath or wheezing.   ALPRAZolam 1 MG tablet Commonly known as: XANAX Take 0.5 mg by mouth daily as needed for anxiety.   aspirin EC 81 MG tablet Take 81 mg by mouth daily. Swallow whole.   azithromycin 250 MG tablet Commonly known as: ZITHROMAX Take 1 tablet (250 mg total) by mouth daily for 4 days.   BREZTRI AEROSPHERE IN Inhale into the lungs.   busPIRone 10 MG tablet Commonly known as: BUSPAR Take 10 mg by mouth 2 (two) times daily.   fluticasone-salmeterol 250-50 MCG/ACT Aepb Commonly known as: Advair Diskus Inhale 1 puff into the lungs in the morning and at bedtime.   gabapentin 300 MG capsule Commonly known as:  NEURONTIN Take 1 capsule (300 mg total) by mouth 2 (two) times daily.   guaiFENesin-dextromethorphan 100-10 MG/5ML syrup Commonly known as: ROBITUSSIN DM Take 5 mLs by mouth every 4 (four) hours as needed for cough.   ipratropium-albuterol 0.5-2.5 (3) MG/3ML Soln Commonly known as: DUONEB Take 3 mLs by nebulization every 4 (four) hours as needed.   linaclotide 72 MCG capsule Commonly known as: Linzess Take 1 capsule (72 mcg total) by mouth  daily as needed.   multivitamin tablet Take 1 tablet by mouth daily.   nystatin 100000 UNIT/ML suspension Commonly known as: MYCOSTATIN Take 5 mLs by mouth 4 (four) times daily.   pantoprazole 40 MG tablet Commonly known as: PROTONIX Take 40 mg by mouth 2 (two) times daily.   predniSONE 10 MG tablet Commonly known as: DELTASONE Take 4 tablets (40 mg total) by mouth daily for 5 days. What changed:  medication strength how much to take   terbinafine 1 % cream Commonly known as: LAMISIL Apply 1 application topically daily as needed (athlete's foot).   Vitamin D3 25 MCG (1000 UT) Caps Take 1,000 Units by mouth daily.        Follow-up Information     Celene Squibb, MD. Schedule an appointment as soon as possible for a visit in 1 week(s).   Specialty: Internal Medicine Contact information: [facility] [address] [phone]         Lori Rockers, MD. Schedule an appointment as soon as possible for a visit.   Specialty: Pulmonary Disease Contact information: [address] [place] [address] [phone]                Allergies  Allergen Reactions   Shrimp [Shellfish Allergy] Anaphylaxis   Hydrocodone Hives and Itching   Lyrica [Pregabalin] Other (See Comments)    unknown    Consultations: None   Procedures/Studies: DG Chest Port 1 View  Result Date: 04/23/2022 CLINICAL DATA:  Shortness of breath and labored breathing. EXAM: PORTABLE CHEST 1 VIEW COMPARISON:  Chest radiograph 03/17/2022 and earlier FINDINGS: The heart size and mediastinal contours are within normal limits. Aortic calcifications. Both lungs are clear. No pleural effusion or pneumothorax. Partially visualized anterior cervical spinal fixation. IMPRESSION: No acute cardiopulmonary abnormality. Electronically Signed   By: Ileana Roup M.D.   On: 04/23/2022 21:50     Discharge Exam: Vitals:   04/25/22 0454 04/25/22 0745  BP:    Pulse:    Resp:    Temp:     SpO2: 96% 96%   Vitals:   04/25/22 0014 04/25/22 0334 04/25/22 0454 04/25/22 0745  BP: 133/64 135/63    Pulse: 92 78    Resp: 16 14    Temp: 98.2 F (36.8 C) 98.2 F (36.8 C)    TempSrc: Oral Oral    SpO2: 96% 97% 96% 96%  Weight:      Height:        General: Pt is alert, awake, not in acute distress Cardiovascular: RRR, S1/S2 +, no rubs, no gallops Respiratory: CTA bilaterally, no wheezing, no rhonchi Abdominal: Soft, NT, ND, bowel sounds + Extremities: no edema, no cyanosis    The results of significant diagnostics from this hospitalization (including imaging, microbiology, ancillary and laboratory) are listed below for reference.     Microbiology: Recent Results (from the past 240 hour(s))  Resp panel by RT-PCR (RSV, Flu A&B, Covid) Anterior Nasal Swab     Status: None   Collection Time:  04/23/22  9:14 PM   Specimen: Anterior Nasal Swab  Result Value Ref Range Status   SARS Coronavirus 2 by RT PCR NEGATIVE NEGATIVE Final    Comment: (NOTE) SARS-CoV-2 target nucleic acids are NOT DETECTED.  The SARS-CoV-2 RNA is generally detectable in upper respiratory specimens during the acute phase of infection. The lowest concentration of SARS-CoV-2 viral copies this assay can detect is 138 copies/mL. A negative result does not preclude SARS-Cov-2 infection and should not be used as the sole basis for treatment or other patient management decisions. A negative result may occur with  improper specimen collection/handling, submission of specimen other than nasopharyngeal swab, presence of viral mutation(s) within the areas targeted by this assay, and inadequate number of viral copies(<138 copies/mL). A negative result must be combined with clinical observations, patient history, and epidemiological information. The expected result is Negative.  Fact Sheet for Patients:  EntrepreneurPulse.com.au  Fact Sheet for Healthcare Providers:   IncredibleEmployment.be  This test is no t yet approved or cleared by the [place] FDA and  has been authorized for detection and/or diagnosis of SARS-CoV-2 by FDA under an Emergency Use Authorization (EUA). This EUA will remain  in effect (meaning this test can be used) for the duration of the COVID-19 declaration under Section 564(b)(1) of the Act, 21 U.S.C.section 360bbb-3(b)(1), unless the authorization is terminated  or revoked sooner.       Influenza A by PCR NEGATIVE NEGATIVE Final   Influenza B by PCR NEGATIVE NEGATIVE Final    Comment: (NOTE) The Xpert Xpress SARS-CoV-2/FLU/RSV plus assay is intended as an aid in the diagnosis of influenza from Nasopharyngeal swab specimens and should not be used as a sole basis for treatment. Nasal washings and aspirates are unacceptable for Xpert Xpress SARS-CoV-2/FLU/RSV testing.  Fact Sheet for Patients: EntrepreneurPulse.com.au  Fact Sheet for Healthcare Providers: IncredibleEmployment.be  This test is not yet approved or cleared by the [place] FDA and has been authorized for detection and/or diagnosis of SARS-CoV-2 by FDA under an Emergency Use Authorization (EUA). This EUA will remain in effect (meaning this test can be used) for the duration of the COVID-19 declaration under Section 564(b)(1) of the Act, 21 U.S.C. section 360bbb-3(b)(1), unless the authorization is terminated or revoked.     Resp Syncytial Virus by PCR NEGATIVE NEGATIVE Final    Comment: (NOTE) Fact Sheet for Patients: EntrepreneurPulse.com.au  Fact Sheet for Healthcare Providers: IncredibleEmployment.be  This test is not yet approved or cleared by the [place] FDA and has been authorized for detection and/or diagnosis of SARS-CoV-2 by FDA under an Emergency Use Authorization (EUA). This EUA will remain in effect (meaning this test can be used) for  the duration of the COVID-19 declaration under Section 564(b)(1) of the Act, 21 U.S.C. section 360bbb-3(b)(1), unless the authorization is terminated or revoked.  Performed at [facility], [address] [place], [place] [address]      Labs: BNP (last 3 results) Recent Labs    03/17/22 1512  BNP 19.1   Basic Metabolic Panel: Recent Labs  Lab 04/23/22 2123 04/24/22 0405 04/25/22 0409  NA 137 140 141  K 3.4* 4.5 4.4  CL 102 103 106  CO2 '28 28 28  '$ GLUCOSE 144* 200* 136*  BUN '12 14 16  '$ CREATININE 1.38* 1.13* 0.89  CALCIUM 8.6* 8.8* 9.0  MG  --  2.4 2.3  PHOS  --  3.6  --    Liver Function Tests: Recent Labs  Lab 04/24/22 0405  AST  20  ALT 16  ALKPHOS 50  BILITOT 0.3  PROT 6.4*  ALBUMIN 3.6   No results for input(s): "LIPASE", "AMYLASE" in the last 168 hours. No results for input(s): "AMMONIA" in the last 168 hours. CBC: Recent Labs  Lab 04/23/22 2123 04/24/22 0405 04/25/22 0409  WBC 13.9* 12.4* 19.2*  HGB 13.3 12.7 12.7  HCT 39.0 38.1 38.5  MCV 93.5 94.1 93.9  PLT 337 328 373   Cardiac Enzymes: No results for input(s): "CKTOTAL", "CKMB", "CKMBINDEX", "TROPONINI" in the last 168 hours. BNP: Invalid input(s): "POCBNP" CBG: No results for input(s): "GLUCAP" in the last 168 hours. D-Dimer No results for input(s): "DDIMER" in the last 72 hours. Hgb A1c No results for input(s): "HGBA1C" in the last 72 hours. Lipid Profile No results for input(s): "CHOL", "HDL", "LDLCALC", "TRIG", "CHOLHDL", "LDLDIRECT" in the last 72 hours. Thyroid function studies No results for input(s): "TSH", "T4TOTAL", "T3FREE", "THYROIDAB" in the last 72 hours.  Invalid input(s): "FREET3" Anemia work up No results for input(s): "VITAMINB12", "FOLATE", "FERRITIN", "TIBC", "IRON", "RETICCTPCT" in the last 72 hours. Urinalysis    Component Value Date/Time   COLORURINE STRAW (A) 01/18/2022 0614   APPEARANCEUR CLEAR 01/18/2022 0614   LABSPEC 1.005 01/18/2022 0614   PHURINE 5.0  01/18/2022 0614   GLUCOSEU NEGATIVE 01/18/2022 0614   HGBUR SMALL (A) 01/18/2022 0614   BILIRUBINUR NEGATIVE 01/18/2022 0614   BILIRUBINUR negative 07/26/2020 1323   KETONESUR NEGATIVE 01/18/2022 0614   PROTEINUR NEGATIVE 01/18/2022 0614   UROBILINOGEN 0.2 07/26/2020 1323   NITRITE NEGATIVE 01/18/2022 0614   LEUKOCYTESUR NEGATIVE 01/18/2022 0614   Sepsis Labs Recent Labs  Lab 04/23/22 2123 04/24/22 0405 04/25/22 0409  WBC 13.9* 12.4* 19.2*   Microbiology Recent Results (from the past 240 hour(s))  Resp panel by RT-PCR (RSV, Flu A&B, Covid) Anterior Nasal Swab     Status: None   Collection Time: 04/23/22  9:14 PM   Specimen: Anterior Nasal Swab  Result Value Ref Range Status   SARS Coronavirus 2 by RT PCR NEGATIVE NEGATIVE Final    Comment: (NOTE) SARS-CoV-2 target nucleic acids are NOT DETECTED.  The SARS-CoV-2 RNA is generally detectable in upper respiratory specimens during the acute phase of infection. The lowest concentration of SARS-CoV-2 viral copies this assay can detect is 138 copies/mL. A negative result does not preclude SARS-Cov-2 infection and should not be used as the sole basis for treatment or other patient management decisions. A negative result may occur with  improper specimen collection/handling, submission of specimen other than nasopharyngeal swab, presence of viral mutation(s) within the areas targeted by this assay, and inadequate number of viral copies(<138 copies/mL). A negative result must be combined with clinical observations, patient history, and epidemiological information. The expected result is Negative.  Fact Sheet for Patients:  EntrepreneurPulse.com.au  Fact Sheet for Healthcare Providers:  IncredibleEmployment.be  This test is no t yet approved or cleared by the [place] FDA and  has been authorized for detection and/or diagnosis of SARS-CoV-2 by FDA under an Emergency Use Authorization  (EUA). This EUA will remain  in effect (meaning this test can be used) for the duration of the COVID-19 declaration under Section 564(b)(1) of the Act, 21 U.S.C.section 360bbb-3(b)(1), unless the authorization is terminated  or revoked sooner.       Influenza A by PCR NEGATIVE NEGATIVE Final   Influenza B by PCR NEGATIVE NEGATIVE Final    Comment: (NOTE) The Xpert Xpress SARS-CoV-2/FLU/RSV plus assay is intended as an aid in the  diagnosis of influenza from Nasopharyngeal swab specimens and should not be used as a sole basis for treatment. Nasal washings and aspirates are unacceptable for Xpert Xpress SARS-CoV-2/FLU/RSV testing.  Fact Sheet for Patients: EntrepreneurPulse.com.au  Fact Sheet for Healthcare Providers: IncredibleEmployment.be  This test is not yet approved or cleared by the [place] FDA and has been authorized for detection and/or diagnosis of SARS-CoV-2 by FDA under an Emergency Use Authorization (EUA). This EUA will remain in effect (meaning this test can be used) for the duration of the COVID-19 declaration under Section 564(b)(1) of the Act, 21 U.S.C. section 360bbb-3(b)(1), unless the authorization is terminated or revoked.     Resp Syncytial Virus by PCR NEGATIVE NEGATIVE Final    Comment: (NOTE) Fact Sheet for Patients: EntrepreneurPulse.com.au  Fact Sheet for Healthcare Providers: IncredibleEmployment.be  This test is not yet approved or cleared by the [place] FDA and has been authorized for detection and/or diagnosis of SARS-CoV-2 by FDA under an Emergency Use Authorization (EUA). This EUA will remain in effect (meaning this test can be used) for the duration of the COVID-19 declaration under Section 564(b)(1) of the Act, 21 U.S.C. section 360bbb-3(b)(1), unless the authorization is terminated or revoked.  Performed at [facility], [address] [place], [place]  [address]      Time coordinating discharge: 35 minutes  SIGNED:   Rodena Goldmann, DO [facility] 04/25/2022, 8:47 AM  If 7PM-7AM, please contact night-coverage [email or web address]

## 2022-04-25 NOTE — Plan of Care (Signed)
  Problem: Education: Goal: Knowledge of General Education information will improve Description Including pain rating scale, medication(s)/side effects and non-pharmacologic comfort measures Outcome: Progressing   

## 2022-04-25 NOTE — Progress Notes (Signed)
Vitals stable. Pt has been feeling short of breath. Respiratory notified and breathing Tx administered. Pt complained of a sore throat, MD notified. PRN Cepacol given. Pt was up most of the night.

## 2022-04-25 NOTE — Plan of Care (Signed)
  Problem: Education: Goal: Knowledge of General Education information will improve Description: Including pain rating scale, medication(s)/side effects and non-pharmacologic comfort measures 04/25/2022 0846 by Santa Lighter, RN Outcome: Adequate for Discharge 04/25/2022 [phone] by Santa Lighter, RN Outcome: Progressing   Problem: Health Behavior/Discharge Planning: Goal: Ability to manage health-related needs will improve Outcome: Adequate for Discharge   Problem: Clinical Measurements: Goal: Ability to maintain clinical measurements within normal limits will improve Outcome: Adequate for Discharge Goal: Will remain free from infection Outcome: Adequate for Discharge Goal: Diagnostic test results will improve Outcome: Adequate for Discharge Goal: Respiratory complications will improve Outcome: Adequate for Discharge Goal: Cardiovascular complication will be avoided Outcome: Adequate for Discharge   Problem: Activity: Goal: Risk for activity intolerance will decrease Outcome: Adequate for Discharge   Problem: Nutrition: Goal: Adequate nutrition will be maintained Outcome: Adequate for Discharge   Problem: Coping: Goal: Level of anxiety will decrease Outcome: Adequate for Discharge   Problem: Elimination: Goal: Will not experience complications related to bowel motility Outcome: Adequate for Discharge Goal: Will not experience complications related to urinary retention Outcome: Adequate for Discharge   Problem: Pain Managment: Goal: General experience of comfort will improve Outcome: Adequate for Discharge   Problem: Safety: Goal: Ability to remain free from injury will improve Outcome: Adequate for Discharge   Problem: Skin Integrity: Goal: Risk for impaired skin integrity will decrease Outcome: Adequate for Discharge   Problem: Education: Goal: Knowledge of General Education information will improve Description: Including pain rating scale, medication(s)/side  effects and non-pharmacologic comfort measures Outcome: Adequate for Discharge   Problem: Health Behavior/Discharge Planning: Goal: Ability to manage health-related needs will improve Outcome: Adequate for Discharge   Problem: Clinical Measurements: Goal: Ability to maintain clinical measurements within normal limits will improve Outcome: Adequate for Discharge Goal: Will remain free from infection Outcome: Adequate for Discharge Goal: Diagnostic test results will improve Outcome: Adequate for Discharge Goal: Respiratory complications will improve Outcome: Adequate for Discharge Goal: Cardiovascular complication will be avoided Outcome: Adequate for Discharge   Problem: Nutrition: Goal: Adequate nutrition will be maintained Outcome: Adequate for Discharge   Problem: Coping: Goal: Level of anxiety will decrease Outcome: Adequate for Discharge   Problem: Elimination: Goal: Will not experience complications related to bowel motility Outcome: Adequate for Discharge Goal: Will not experience complications related to urinary retention Outcome: Adequate for Discharge   Problem: Pain Managment: Goal: General experience of comfort will improve Outcome: Adequate for Discharge   Problem: Safety: Goal: Ability to remain free from injury will improve Outcome: Adequate for Discharge   Problem: Skin Integrity: Goal: Risk for impaired skin integrity will decrease Outcome: Adequate for Discharge   Problem: Education: Goal: Knowledge of disease or condition will improve Outcome: Adequate for Discharge Goal: Knowledge of the prescribed therapeutic regimen will improve Outcome: Adequate for Discharge Goal: Individualized Educational Video(s) Outcome: Adequate for Discharge   Problem: Activity: Goal: Ability to tolerate increased activity will improve Outcome: Adequate for Discharge Goal: Will verbalize the importance of balancing activity with adequate rest periods Outcome:  Adequate for Discharge   Problem: Respiratory: Goal: Ability to maintain a clear airway will improve Outcome: Adequate for Discharge Goal: Levels of oxygenation will improve Outcome: Adequate for Discharge Goal: Ability to maintain adequate ventilation will improve Outcome: Adequate for Discharge

## 2022-04-25 NOTE — Progress Notes (Signed)
Removed IV-CDI. Patient stated that she did not need paperwork and walked out with her husband.

## 2022-05-03 ENCOUNTER — Ambulatory Visit: Payer: PPO | Admitting: Internal Medicine

## 2022-05-03 DIAGNOSIS — J449 Chronic obstructive pulmonary disease, unspecified: Secondary | ICD-10-CM | POA: Diagnosis not present

## 2022-05-03 DIAGNOSIS — F17210 Nicotine dependence, cigarettes, uncomplicated: Secondary | ICD-10-CM | POA: Diagnosis not present

## 2022-05-03 DIAGNOSIS — F411 Generalized anxiety disorder: Secondary | ICD-10-CM | POA: Diagnosis not present

## 2022-05-03 DIAGNOSIS — R070 Pain in throat: Secondary | ICD-10-CM | POA: Diagnosis not present

## 2022-05-05 ENCOUNTER — Emergency Department (HOSPITAL_COMMUNITY): Admission: EM | Admit: 2022-05-05 | Discharge: 2022-05-05 | Discharge status: Against Medical Advice | Payer: PPO

## 2022-05-10 DIAGNOSIS — J449 Chronic obstructive pulmonary disease, unspecified: Secondary | ICD-10-CM | POA: Diagnosis not present

## 2022-05-19 ENCOUNTER — Encounter: Payer: Self-pay | Admitting: Internal Medicine

## 2022-05-24 ENCOUNTER — Ambulatory Visit (HOSPITAL_COMMUNITY)
Admission: RE | Admit: 2022-05-24 | Discharge: 2022-05-24 | Disposition: A | Discharge status: Home / Self Care | Payer: PPO | Source: Ambulatory Visit | Attending: Internal Medicine | Admitting: Internal Medicine

## 2022-05-24 DIAGNOSIS — J449 Chronic obstructive pulmonary disease, unspecified: Secondary | ICD-10-CM | POA: Diagnosis not present

## 2022-05-24 LAB — PULMONARY FUNCTION TEST
DL/VA % pred: 86 %
DL/VA: 3.62 ml/min/mmHg/L
DLCO unc % pred: 57 %
DLCO unc: 10.46 ml/min/mmHg
FEF 25-75 Post: 0.48 L/sec
FEF 25-75 Pre: 0.43 L/sec
FEF2575-%Change-Post: 10 %
FEF2575-%Pred-Post: 27 %
FEF2575-%Pred-Pre: 24 %
FEV1-%Change-Post: 1 %
FEV1-%Pred-Post: 49 %
FEV1-%Pred-Pre: 49 %
FEV1-Post: 1.02 L
FEV1-Pre: 1.01 L
FEV1FVC-%Change-Post: -2 %
FEV1FVC-%Pred-Pre: 77 %
FEV6-%Change-Post: 3 %
FEV6-%Pred-Post: 66 %
FEV6-%Pred-Pre: 64 %
FEV6-Post: 1.73 L
FEV6-Pre: 1.67 L
FEV6FVC-%Change-Post: 0 %
FEV6FVC-%Pred-Post: 101 %
FEV6FVC-%Pred-Pre: 102 %
FVC-%Change-Post: 4 %
FVC-%Pred-Post: 65 %
FVC-%Pred-Pre: 63 %
FVC-Post: 1.79 L
FVC-Pre: 1.72 L
Post FEV1/FVC ratio: 57 %
Post FEV6/FVC ratio: 97 %
Pre FEV1/FVC ratio: 59 %
Pre FEV6/FVC Ratio: 97 %
RV % pred: 244 %
RV: 5.17 L
TLC % pred: 148 %
TLC: 7.07 L

## 2022-05-24 MED ORDER — ALBUTEROL SULFATE (2.5 MG/3ML) 0.083% IN NEBU
2.5000 mg | INHALATION_SOLUTION | Freq: Once | RESPIRATORY_TRACT | Status: AC
  Administered 2022-05-24: 2.5 mg via RESPIRATORY_TRACT

## 2022-06-05 NOTE — Progress Notes (Unsigned)
Lori Lopez, female    DOB: 1951-03-26    MRN: [id number]   Brief patient profile:  25  yowf active smoker referred to pulmonary clinic in [place]  02/01/2022 by Dr Nevada Crane for copd eval   Admit date: 01/17/2022 Discharge date: 01/19/2022      Brief Hospitalization Summary: Please see all hospital notes, images, labs for full details of the hospitalization.  ADMISSION HPI: 72 y.o. female with medical history significant of COPD, GERD, tobacco abuse who presents to the emergency department via EMS due to 4-day onset of shortness of breath, wheezing, chest congestion and nonproductive cough, she went to an urgent care on Friday (9/8) but still continued to wheeze and have chest congestion.  She used home nebulizer with only minimal relief.  She continues to smoke cigarettes.  She denies fever, chills, chest pain, headache, nausea, vomiting.  Patient was never intubated due to COPD exacerbation.   ED Course:  In the emergency department, she was tachycardic, and was hypoxic on arrival (84% on room air), she was placed on supplemental oxygen with improvement in O2 sats to 96% on supplemental oxygen via [place] at 3 LPM, other vital signs were within normal range.  Work-up in the ED showed leukocytosis, H/H-15.6/47.7, D-dimer < 0.27, BMP was normal except for glucose of 147.  SARS coronavirus 2 was negative.  Chest x-ray showed no active disease.  Breathing treatment was provided, Decadron 10 mg x 1 was given.  Hospitalist was asked to admit patient for further evaluation and management.   HOSPITAL COURSE BY PROBLEM    Acute respiratory failure with hypoxia due to acute exacerbation of COPD Pt responded well to scheduled duo nebs, Mucinex, Solu-Medrol, azithromycin. Continue Protonix to prevent steroid-induced ulcer Continue incentive spirometry and flutter valve Continue supplemental oxygen to maintain O2 sat > 94% with plan to wean patient off oxygen as tolerated (patient does not use oxygen at  baseline) Pt feeling better, DC home today, follow up with PCP in 1 week Pt will discharge on prednisone taper, nebulizers, doxycycline oral tabs  Pt will discharge on home oxygen arranged prior to discharge 2L/min     SIRS in the setting of above Resolved.  Sepsis ruled out.    Dehydration IV hydration was provided with good results    Hyperglycemia possibly reactive from steroids CBG 147, she appears to have been on prednisone per home med rec CBG (last 3)  Recent Labs (last 2 labs)      Recent Labs    01/18/22 0647  GLUCAP 148*        Leukocytosis possibly reactive from prednisone (leukemoid reaction) WBC 13.9, she appears to have been on prednisone per home med rec   GERD Continue Protonix   Tobacco abuse Patient was counseled on tobacco abuse cessation   DVT prophylaxis: Lovenox    Discharge Diagnoses:  Principal Problem:   Acute exacerbation of chronic obstructive pulmonary disease (COPD) (HCC)  GERD (gastroesophageal reflux disease)   Hyperglycemia   Leukocytosis   Tobacco abuse   SIRS (systemic inflammatory response syndrome) (HCC)      TAKE these medications     albuterol 108 (90 Base) MCG/ACT inhaler Commonly known as: VENTOLIN HFA 2 puffs every 4 (four) hours as needed for shortness of breath or wheezing.    ALPRAZolam 1 MG tablet Commonly known as: XANAX Take 0.5 mg by mouth daily as needed for anxiety.    aspirin EC 81 MG tablet Take 81 mg by mouth daily. Swallow  whole.    doxycycline 100 MG capsule Commonly known as: VIBRAMYCIN Take 1 capsule (100 mg total) by mouth 2 (two) times daily for 3 days.    gabapentin 300 MG capsule Commonly known as: NEURONTIN Take 1 capsule (300 mg total) by mouth 2 (two) times daily.    guaiFENesin-dextromethorphan 100-10 MG/5ML syrup Commonly known as: ROBITUSSIN DM Take 5 mLs by mouth every 4 (four) hours as needed for cough.    ipratropium-albuterol 0.5-2.5 (3) MG/3ML Soln Commonly known as:  DUONEB Take 3 mLs by nebulization every 4 (four) hours as needed.    linaclotide 72 MCG capsule Commonly known as: Linzess Take 1 capsule (72 mcg total) by mouth daily as needed.    pantoprazole 40 MG tablet Commonly known as: PROTONIX Take 40 mg by mouth 2 (two) times daily.    predniSONE 20 MG tablet Commonly known as: DELTASONE Take 3 PO QAM x3days, 2 PO QAM x3days, 1 PO QAM x3days Start taking on: January 20, 2022 What changed:  how much to take how to take this when to take this additional instructions    terbinafine 1 % cream Commonly known as: LAMISIL Apply 1 application topically daily as needed (athlete's foot).    Vitamin D3 25 MCG (1000 UT) Caps Take 1,000 Units by mouth daily.         History of Present Illness  02/01/2022  Pulmonary/ 1st office eval/ [facility] / [place] [facility]  Chief Complaint  Patient presents with   Consult    Consult for abnormal ct scan in June 2023  Has been on oxygen for about 2-3 weeks prn during day and mainly at night   Dyspnea:   very active prior to admit 100 ft to MB with a hill fine but not any more doing since d/c.  Says did use neb prior to admit but no maint resp rx or 02 Cough: white in am  Sleep: bed blocks/ 1 pillow  SABA use: hfa and neb  02 2lpm hs  Rec Plan A = Automatic = Always=    Breztri Take 2 puffs first thing in am and then another 2 puffs about 12 hours later.  Work on inhaler technique:    Plan B = Backup (to supplement plan A, not to replace it) Only use your albuterol inhaler as a rescue medication  Plan C = Crisis (instead of Plan B but only if Plan B stops working) - only use your albuterol nebulizer if you first try [place] to try albuterol 15 min before an activity (on alternating days)  that you know would usually make you short of breath   02 2lpm at bedtime plus Make sure you check your oxygen saturation  AT  your highest level of activity (not after you stop)   to be sure it stays over  90%  Suggested e-cigs as an optional  "one way bridge"  Off all tobacco products    PFTs next available in [place]. - not done as of 03/16/2022    03/16/2022  f/u ov/[place] office/Braxtyn Bojarski re: Group E Copd/ 02 hs only  maint on breztri   Chief Complaint  Patient presents with   Follow-up    Has questions about ventilator?    Dyspnea:  around her property some hills/ slowed by L thigh pain  Cough:  much better  Sleeping: bed blocks/ on side / one pillow  SABA use: very little need  02: 2lpm hs only/ eval for noct needs planned per  Dr Juel Burrow office  Covid status: vax never / never infectred  Lung cancer screening: 10/07/21  pos emphysema  Rec No change in your medications  Check with Dr Juel Burrow office regarding your sleep evaluation/ oxygen needs at bedtime  but for now continue 2lpm at bedtime and none needed daytime  Work hard on stopping all cigarettes and vapes      06/06/2022  f/u ov/[place] office/Itzayana Pardy re: GOLD 3 COPD  maint on breztri  Chief Complaint  Patient presents with   Follow-up    Breathing better since last ov  Has questions about advair inhaler   Dyspnea:  slow walking / not doing as much ex due to husband's dementia Cough: min white mostly in ams' - questions whether advair discus would be easier on mouth vs breztri full dose ( has been rinsing and gargling already)  Sleeping: bed blocks no resp SABA use: very little  02: 2lpm hs  Covid status: never vax / never infected  Lung cancer screening: Dr Juel Burrow office q June    No obvious day to day or daytime variability or assoc excess/ purulent sputum or mucus plugs or hemoptysis or cp or chest tightness, subjective wheeze or overt sinus or hb symptoms.   Sleeping  without nocturnal  or early am exacerbation  of respiratory  c/o's or need for noct saba. Also denies any obvious fluctuation of symptoms with weather or environmental changes or other aggravating or alleviating factors except as outlined above    No unusual exposure hx or h/o childhood pna/ asthma or knowledge of premature birth.  Current Allergies, Complete Past Medical History, Past Surgical History, Family History, and Social History were reviewed in [employer] record.  ROS  The following are not active complaints unless bolded Hoarseness, sore throat, dysphagia, dental problems, itching, sneezing,  nasal congestion or discharge of excess mucus or purulent secretions, ear ache,   fever, chills, sweats, unintended wt loss or wt gain, classically pleuritic or exertional cp,  orthopnea pnd or arm/hand swelling  or leg swelling, presyncope, palpitations, abdominal pain, anorexia, nausea, vomiting, diarrhea  or change in bowel habits or change in bladder habits, change in stools or change in urine, dysuria, hematuria,  rash, arthralgias, visual complaints, headache, numbness, weakness or ataxia or problems with walking or coordination,  change in mood or  memory.        Current Meds  Medication Sig   albuterol (VENTOLIN HFA) 108 (90 Base) MCG/ACT inhaler 2 puffs every 4 (four) hours as needed for shortness of breath or wheezing.   ALPRAZolam (XANAX) 1 MG tablet Take 0.5 mg by mouth daily as needed for anxiety.   aspirin EC 81 MG tablet Take 81 mg by mouth daily. Swallow whole.   Budeson-Glycopyrrol-Formoterol (BREZTRI AEROSPHERE IN) Inhale into the lungs.   busPIRone (BUSPAR) 10 MG tablet Take 10 mg by mouth 2 (two) times daily.   Cholecalciferol (VITAMIN D3) 1000 units CAPS Take 1,000 Units by mouth daily.   gabapentin (NEURONTIN) 300 MG capsule Take 1 capsule (300 mg total) by mouth 2 (two) times daily.   guaiFENesin-dextromethorphan (ROBITUSSIN DM) 100-10 MG/5ML syrup Take 5 mLs by mouth every 4 (four) hours as needed for cough.   ipratropium-albuterol (DUONEB) 0.5-2.5 (3) MG/3ML SOLN Take 3 mLs by nebulization every 4 (four) hours as needed.   levocetirizine (XYZAL) 5 MG tablet Take 5 mg by mouth at bedtime.    linaclotide (LINZESS) 72 MCG capsule Take 1 capsule (72 mcg total) by mouth  daily as needed.   Multiple Vitamin (MULTIVITAMIN) tablet Take 1 tablet by mouth daily.   nystatin (MYCOSTATIN) 100000 UNIT/ML suspension Take 5 mLs by mouth 4 (four) times daily.   pantoprazole (PROTONIX) 40 MG tablet Take 40 mg by mouth 2 (two) times daily.   terbinafine (LAMISIL) 1 % cream Apply 1 application topically daily as needed (athlete's foot).                         Past Medical History:  Diagnosis Date   Adjustment disorder with depressed mood 09/08/2009   Allergic rhinitis due to pollen 09/08/2009   Anxiety    Cigarette nicotine dependence, uncomplicated 08/16/7351   COPD (chronic obstructive pulmonary disease) ([place]) 11/05/2010   Degeneration of thoracolumbar intervertebral disc 06/13/2013   Degeneration, intervertebral disc, cervicothoracic 06/13/2013   Hypothyroidism    Mixed hyperlipidemia 05/15/2013   Obesity due to excess calories 05/15/2013   Tinea unguium 05/15/2013   Vitamin D deficiency 12/28/2009      Objective:    Wts   06/06/2022        158   03/16/22 158 lb 6.4 oz (71.8 kg)  02/01/22 151 lb 9.6 oz (68.8 kg)  01/18/22 158 lb 15.2 oz (72.1 kg)     Vital signs reviewed  06/06/2022  - Note at rest 02 sats  96% on RA   General appearance:    slt hoarse/ smoker's rattle  /full dentures   HEENT : Oropharynx  clear   Nasal turbinates nl    NECK :  without  apparent JVD/ palpable Nodes/TM    LUNGS: no acc muscle use,  Mild barrel  contour chest wall with bilateral  Distant bs s audible wheeze and  without cough on insp or exp maneuvers  and mild  Hyperresonant  to  percussion bilaterally    CV:  RRR  no s3 or murmur or increase in P2, and no edema   ABD:  soft and nontender with pos end  insp Hoover's  in the supine position.  No bruits or organomegaly appreciated   MS:  Nl gait/ ext warm without deformities Or obvious joint restrictions  calf tenderness, cyanosis or clubbing     SKIN: warm and dry without lesions    NEURO:  alert, approp, nl sensorium with  no motor or cerebellar deficits apparent.        I personally reviewed images and agree with radiology impression as follows:  CXR:   04/23/22 portable No acute cardiopulmonary abnormality.    Assessment

## 2022-06-06 ENCOUNTER — Ambulatory Visit (INDEPENDENT_AMBULATORY_CARE_PROVIDER_SITE_OTHER): Payer: PPO | Admitting: Internal Medicine

## 2022-06-06 ENCOUNTER — Encounter: Payer: Self-pay | Admitting: Internal Medicine

## 2022-06-06 VITALS — BP 130/66 | HR 68 | Temp 98.0°F | Ht 62.0 in | Wt 158.8 lb

## 2022-06-06 DIAGNOSIS — J9612 Chronic respiratory failure with hypercapnia: Secondary | ICD-10-CM | POA: Diagnosis not present

## 2022-06-06 DIAGNOSIS — F17210 Nicotine dependence, cigarettes, uncomplicated: Secondary | ICD-10-CM | POA: Diagnosis not present

## 2022-06-06 DIAGNOSIS — J449 Chronic obstructive pulmonary disease, unspecified: Secondary | ICD-10-CM

## 2022-06-06 DIAGNOSIS — J9611 Chronic respiratory failure with hypoxia: Secondary | ICD-10-CM | POA: Diagnosis not present

## 2022-06-06 NOTE — Assessment & Plan Note (Signed)
Counseled re importance of smoking cessation but did not meet time criteria for separate billing            Each maintenance medication was reviewed in detail including emphasizing most importantly the difference between maintenance and prns and under what circumstances the prns are to be triggered using an action plan format where appropriate.  Total time for H and P, chart review, counseling, reviewing hfa/ 02/neb device(s) and generating customized AVS unique to this office visit / same day charting  > 30 min for multiple  refractory respiratory  symptoms

## 2022-06-06 NOTE — Patient Instructions (Signed)
Try breztri Take 1 puff  first thing in am and then another 2 puffs about 12 hours later.    Work on inhaler technique:  relax and gently blow all the way out then take a nice smooth full deep breath back in, triggering the inhaler at same time you start breathing in.  Hold breath in for at least  5 seconds if you can. Blow out breztri out  thru nose. Rinse and gargle with water when done.  If mouth or throat bother you at all,  try brushing teeth/gums/tongue with arm and hammer toothpaste/ make a slurry and gargle and spit out.   If mouth still a problem > consider change to Stiolto (would need a visit to teach how to use though)    Use vapes in place of cigarettes and uncouple to smoking from the coffee    Please schedule a follow up visit in 6 months but call sooner if needed

## 2022-06-06 NOTE — Assessment & Plan Note (Signed)
HC03  01/17/22  = 32  - 02/01/2022   Walked on RA  X 3   lap(s) =  approx 450  ft  @ mod pace, stopped due to end of study  with lowest 02 sats 94% no sob   Advised : Make sure you check your oxygen saturation  AT  your highest level of activity (not after you stop)   to be sure it stays over 90% and adjust  02 flow upward to maintain this level if needed but remember to turn it back to previous settings when you stop (to conserve your supply).

## 2022-06-06 NOTE — Assessment & Plan Note (Signed)
Active smoker s/p aecopd> admit 01/17/22 - 02/01/2022  continue breztri  - PFT's  05/24/22  FEV1 1.02 (49 % ) ratio 0.57  p 1 % improvement from saba p Breztri prior to study with DLCO  10.46 (57%)   and FV curve mildly concave     - 06/06/2022  After extensive coaching inhaler device,  effectiveness =    75% (short Ti) > continue breztri  1-2 bid to reduce effects on oropharynx    Group D (now reclassified as E) in terms of symptom/risk and laba/lama/ICS  therefore appropriate rx at this point >>>  breztri and approp saba

## 2022-06-10 DIAGNOSIS — J449 Chronic obstructive pulmonary disease, unspecified: Secondary | ICD-10-CM | POA: Diagnosis not present

## 2022-07-09 DIAGNOSIS — J449 Chronic obstructive pulmonary disease, unspecified: Secondary | ICD-10-CM | POA: Diagnosis not present

## 2022-07-15 DIAGNOSIS — F17200 Nicotine dependence, unspecified, uncomplicated: Secondary | ICD-10-CM | POA: Diagnosis not present

## 2022-07-15 DIAGNOSIS — K219 Gastro-esophageal reflux disease without esophagitis: Secondary | ICD-10-CM | POA: Diagnosis not present

## 2022-07-15 DIAGNOSIS — J387 Other diseases of larynx: Secondary | ICD-10-CM | POA: Diagnosis not present

## 2022-08-09 DIAGNOSIS — J449 Chronic obstructive pulmonary disease, unspecified: Secondary | ICD-10-CM | POA: Diagnosis not present

## 2022-08-24 DIAGNOSIS — J449 Chronic obstructive pulmonary disease, unspecified: Secondary | ICD-10-CM | POA: Diagnosis not present

## 2022-08-24 DIAGNOSIS — F17210 Nicotine dependence, cigarettes, uncomplicated: Secondary | ICD-10-CM | POA: Diagnosis not present

## 2022-08-24 DIAGNOSIS — J019 Acute sinusitis, unspecified: Secondary | ICD-10-CM | POA: Diagnosis not present

## 2022-08-24 DIAGNOSIS — M2540 Effusion, unspecified joint: Secondary | ICD-10-CM | POA: Diagnosis not present

## 2022-08-24 DIAGNOSIS — R7303 Prediabetes: Secondary | ICD-10-CM | POA: Diagnosis not present

## 2022-08-24 DIAGNOSIS — M25441 Effusion, right hand: Secondary | ICD-10-CM | POA: Diagnosis not present

## 2022-08-24 DIAGNOSIS — R202 Paresthesia of skin: Secondary | ICD-10-CM | POA: Diagnosis not present

## 2022-08-24 DIAGNOSIS — Z634 Disappearance and death of family member: Secondary | ICD-10-CM | POA: Diagnosis not present

## 2022-08-26 ENCOUNTER — Other Ambulatory Visit (HOSPITAL_COMMUNITY): Payer: Self-pay | Admitting: Family Medicine

## 2022-08-26 DIAGNOSIS — Z0000 Encounter for general adult medical examination without abnormal findings: Secondary | ICD-10-CM | POA: Diagnosis not present

## 2022-08-26 DIAGNOSIS — M79641 Pain in right hand: Secondary | ICD-10-CM

## 2022-08-29 ENCOUNTER — Other Ambulatory Visit (HOSPITAL_COMMUNITY): Payer: Self-pay | Admitting: Family Medicine

## 2022-08-29 DIAGNOSIS — F411 Generalized anxiety disorder: Secondary | ICD-10-CM | POA: Diagnosis not present

## 2022-08-29 DIAGNOSIS — F17210 Nicotine dependence, cigarettes, uncomplicated: Secondary | ICD-10-CM | POA: Diagnosis not present

## 2022-08-29 DIAGNOSIS — R070 Pain in throat: Secondary | ICD-10-CM | POA: Diagnosis not present

## 2022-08-29 DIAGNOSIS — J449 Chronic obstructive pulmonary disease, unspecified: Secondary | ICD-10-CM | POA: Diagnosis not present

## 2022-08-29 DIAGNOSIS — K5904 Chronic idiopathic constipation: Secondary | ICD-10-CM | POA: Diagnosis not present

## 2022-08-29 DIAGNOSIS — G629 Polyneuropathy, unspecified: Secondary | ICD-10-CM | POA: Diagnosis not present

## 2022-08-29 DIAGNOSIS — Z122 Encounter for screening for malignant neoplasm of respiratory organs: Secondary | ICD-10-CM

## 2022-08-29 DIAGNOSIS — R7303 Prediabetes: Secondary | ICD-10-CM | POA: Diagnosis not present

## 2022-08-29 DIAGNOSIS — D751 Secondary polycythemia: Secondary | ICD-10-CM | POA: Diagnosis not present

## 2022-08-29 DIAGNOSIS — I700 Atherosclerosis of aorta: Secondary | ICD-10-CM | POA: Diagnosis not present

## 2022-08-29 DIAGNOSIS — E785 Hyperlipidemia, unspecified: Secondary | ICD-10-CM | POA: Diagnosis not present

## 2022-08-29 DIAGNOSIS — K219 Gastro-esophageal reflux disease without esophagitis: Secondary | ICD-10-CM | POA: Diagnosis not present

## 2022-08-29 DIAGNOSIS — J439 Emphysema, unspecified: Secondary | ICD-10-CM | POA: Diagnosis not present

## 2022-09-08 DIAGNOSIS — J449 Chronic obstructive pulmonary disease, unspecified: Secondary | ICD-10-CM | POA: Diagnosis not present

## 2022-09-14 ENCOUNTER — Ambulatory Visit: Payer: PPO | Admitting: Internal Medicine

## 2022-09-27 DIAGNOSIS — M79641 Pain in right hand: Secondary | ICD-10-CM | POA: Diagnosis not present

## 2022-09-27 DIAGNOSIS — R200 Anesthesia of skin: Secondary | ICD-10-CM | POA: Diagnosis not present

## 2022-10-09 DIAGNOSIS — J449 Chronic obstructive pulmonary disease, unspecified: Secondary | ICD-10-CM | POA: Diagnosis not present

## 2022-10-28 ENCOUNTER — Ambulatory Visit (HOSPITAL_COMMUNITY): Admission: RE | Admit: 2022-10-28 | Payer: PPO | Source: Ambulatory Visit

## 2022-10-30 NOTE — Progress Notes (Unsigned)
Lori Lopez, female    DOB: 1951-03-26    MRN: [id number]   Brief patient profile:  25  yowf active smoker referred to pulmonary clinic in [place]  02/01/2022 by Dr Lori Lopez for copd eval   Admit date: 01/17/2022 Discharge date: 01/19/2022      Brief Hospitalization Summary: Please see all hospital notes, images, labs for full details of the hospitalization.  ADMISSION HPI: 72 y.o. female with medical history significant of COPD, GERD, tobacco abuse who presents to the emergency department via EMS due to 4-day onset of shortness of breath, wheezing, chest congestion and nonproductive cough, she went to an urgent care on Friday (9/8) but still continued to wheeze and have chest congestion.  She used home nebulizer with only minimal relief.  She continues to smoke cigarettes.  She denies fever, chills, chest pain, headache, nausea, vomiting.  Patient was never intubated due to COPD exacerbation.   ED Course:  In the emergency department, she was tachycardic, and was hypoxic on arrival (84% on room air), she was placed on supplemental oxygen with improvement in O2 sats to 96% on supplemental oxygen via [place] at 3 LPM, other vital signs were within normal range.  Work-up in the ED showed leukocytosis, H/H-15.6/47.7, D-dimer < 0.27, BMP was normal except for glucose of 147.  SARS coronavirus 2 was negative.  Chest x-ray showed no active disease.  Breathing treatment was provided, Decadron 10 mg x 1 was given.  Hospitalist was asked to admit patient for further evaluation and management.   HOSPITAL COURSE BY PROBLEM    Acute respiratory failure with hypoxia due to acute exacerbation of COPD Pt responded well to scheduled duo nebs, Mucinex, Solu-Medrol, azithromycin. Continue Protonix to prevent steroid-induced ulcer Continue incentive spirometry and flutter valve Continue supplemental oxygen to maintain O2 sat > 94% with plan to wean patient off oxygen as tolerated (patient does not use oxygen at  baseline) Pt feeling better, DC home today, follow up with PCP in 1 week Pt will discharge on prednisone taper, nebulizers, doxycycline oral tabs  Pt will discharge on home oxygen arranged prior to discharge 2L/min     SIRS in the setting of above Resolved.  Sepsis ruled out.    Dehydration IV hydration was provided with good results    Hyperglycemia possibly reactive from steroids CBG 147, she appears to have been on prednisone per home med rec CBG (last 3)  Recent Labs (last 2 labs)      Recent Labs    01/18/22 0647  GLUCAP 148*        Leukocytosis possibly reactive from prednisone (leukemoid reaction) WBC 13.9, she appears to have been on prednisone per home med rec   GERD Continue Protonix   Tobacco abuse Patient was counseled on tobacco abuse cessation   DVT prophylaxis: Lovenox    Discharge Diagnoses:  Principal Problem:   Acute exacerbation of chronic obstructive pulmonary disease (COPD) (HCC)  GERD (gastroesophageal reflux disease)   Hyperglycemia   Leukocytosis   Tobacco abuse   SIRS (systemic inflammatory response syndrome) (HCC)      TAKE these medications     albuterol 108 (90 Base) MCG/ACT inhaler Commonly known as: VENTOLIN HFA 2 puffs every 4 (four) hours as needed for shortness of breath or wheezing.    ALPRAZolam 1 MG tablet Commonly known as: XANAX Take 0.5 mg by mouth daily as needed for anxiety.    aspirin EC 81 MG tablet Take 81 mg by mouth daily. Swallow  whole.    doxycycline 100 MG capsule Commonly known as: VIBRAMYCIN Take 1 capsule (100 mg total) by mouth 2 (two) times daily for 3 days.    gabapentin 300 MG capsule Commonly known as: NEURONTIN Take 1 capsule (300 mg total) by mouth 2 (two) times daily.    guaiFENesin-dextromethorphan 100-10 MG/5ML syrup Commonly known as: ROBITUSSIN DM Take 5 mLs by mouth every 4 (four) hours as needed for cough.    ipratropium-albuterol 0.5-2.5 (3) MG/3ML Soln Commonly known as:  DUONEB Take 3 mLs by nebulization every 4 (four) hours as needed.    linaclotide 72 MCG capsule Commonly known as: Linzess Take 1 capsule (72 mcg total) by mouth daily as needed.    pantoprazole 40 MG tablet Commonly known as: PROTONIX Take 40 mg by mouth 2 (two) times daily.    predniSONE 20 MG tablet Commonly known as: DELTASONE Take 3 PO QAM x3days, 2 PO QAM x3days, 1 PO QAM x3days Start taking on: January 20, 2022 What changed:  how much to take how to take this when to take this additional instructions    terbinafine 1 % cream Commonly known as: LAMISIL Apply 1 application topically daily as needed (athlete's foot).    Vitamin D3 25 MCG (1000 UT) Caps Take 1,000 Units by mouth daily.         History of Present Illness  02/01/2022  Pulmonary/ 1st office eval/ Lori Lopez / [place] [facility]  Chief Complaint  Patient presents with   Consult    Consult for abnormal ct scan in June 2023  Has been on oxygen for about 2-3 weeks prn during day and mainly at night   Dyspnea:   very active prior to admit 100 ft to MB with a hill fine but not any more doing since d/c.  Says did use neb prior to admit but no maint resp rx or 02 Cough: white in am  Sleep: bed blocks/ 1 pillow  SABA use: hfa and neb  02 2lpm hs  Rec Plan A = Automatic = Always=    Breztri Take 2 puffs first thing in am and then another 2 puffs about 12 hours later.  Work on inhaler technique:    Plan B = Backup (to supplement plan A, not to replace it) Only use your albuterol inhaler as a rescue medication  Plan C = Crisis (instead of Plan B but only if Plan B stops working) - only use your albuterol nebulizer if you first try [place] to try albuterol 15 min before an activity (on alternating days)  that you know would usually make you short of breath   02 2lpm at bedtime plus Make sure you check your oxygen saturation  AT  your highest level of activity (not after you stop)   to be sure it stays over  90%  Suggested e-cigs as an optional  "one way bridge"  Off all tobacco products    PFTs next available in Lori Lopez. - not done as of 03/16/2022    03/16/2022  f/u ov/Lori Lopez office/Lori Lopez re: Group E Copd/ 02 hs only  maint on breztri   Chief Complaint  Patient presents with   Follow-up    Has questions about ventilator?    Dyspnea:  around her property some hills/ slowed by L thigh pain  Cough:  much better  Sleeping: bed blocks/ on side / one pillow  SABA use: very little need  02: 2lpm hs only/ eval for noct needs planned per  Dr Scharlene Gloss office  Covid status: vax never / never infectred  Lung cancer screening: 10/07/21  pos emphysema  Rec No change in your medications  Check with Dr Scharlene Gloss office regarding your sleep evaluation/ oxygen needs at bedtime  but for now continue 2lpm at bedtime and none needed daytime  Work hard on stopping all cigarettes and vapes      06/06/2022  f/u ov/[place] office/Danae Oland re: GOLD 3 COPD  maint on breztri  Chief Complaint  Patient presents with   Follow-up    Breathing better since last ov  Has questions about advair inhaler   Dyspnea:  slow walking / not doing as much ex due to husband's dementia Cough: min white mostly in ams' - questions whether advair discus would be easier on mouth vs breztri full dose ( has been rinsing and gargling already)  Sleeping: bed blocks no resp SABA use: very little  02: 2lpm hs  Covid status: never vax / never infected  Lung cancer screening: Dr Scharlene Gloss office q June  Rec Try breztri Take 1 puff  first thing in am and then another 2 puffs about 12 hours later.   Work on inhaler technique:  If mouth still a problem > consider change to Stiolto (would need a visit to teach how to use though)   Use vapes in place of cigarettes and uncouple to smoking from the coffee      10/31/2022  6 m f/u ov/[place] office/Karra Pink re: *** maint on ***  No chief complaint on file.   Dyspnea:  *** Cough:  *** Sleeping: *** SABA use: *** 02: *** Covid status: *** Lung cancer screening: ***   No obvious day to day or daytime variability or assoc excess/ purulent sputum or mucus plugs or hemoptysis or cp or chest tightness, subjective wheeze or overt sinus or hb symptoms.   *** without nocturnal  or early am exacerbation  of respiratory  c/o's or need for noct saba. Also denies any obvious fluctuation of symptoms with weather or environmental changes or other aggravating or alleviating factors except as outlined above   No unusual exposure hx or h/o childhood pna/ asthma or knowledge of premature birth.  Current Allergies, Complete Past Medical History, Past Surgical History, Family History, and Social History were reviewed in [employer] record.  ROS  The following are not active complaints unless bolded Hoarseness, sore throat, dysphagia, dental problems, itching, sneezing,  nasal congestion or discharge of excess mucus or purulent secretions, ear ache,   fever, chills, sweats, unintended wt loss or wt gain, classically pleuritic or exertional cp,  orthopnea pnd or arm/hand swelling  or leg swelling, presyncope, palpitations, abdominal pain, anorexia, nausea, vomiting, diarrhea  or change in bowel habits or change in bladder habits, change in stools or change in urine, dysuria, hematuria,  rash, arthralgias, visual complaints, headache, numbness, weakness or ataxia or problems with walking or coordination,  change in mood or  memory.        No outpatient medications have been marked as taking for the 10/31/22 encounter (Appointment) with Nyoka Cowden, MD.                           Past Medical History:  Diagnosis Date   Adjustment disorder with depressed mood 09/08/2009   Allergic rhinitis due to pollen 09/08/2009   Anxiety    Cigarette nicotine dependence, uncomplicated 05/25/2009   COPD (chronic obstructive pulmonary disease) (HCC)  11/05/2010   Degeneration  of thoracolumbar intervertebral disc 06/13/2013   Degeneration, intervertebral disc, cervicothoracic 06/13/2013   Hypothyroidism    Mixed hyperlipidemia 05/15/2013   Obesity due to excess calories 05/15/2013   Tinea unguium 05/15/2013   Vitamin D deficiency 12/28/2009      Objective:    Wts  10/31/2022         *** 06/06/2022        158   03/16/22 158 lb 6.4 oz (71.8 kg)  02/01/22 151 lb 9.6 oz (68.8 kg)  01/18/22 158 lb 15.2 oz (72.1 kg)      Vital signs reviewed  10/31/2022  - Note at rest 02 sats  ***% on ***   General appearance:    ***    Mild barr***       Assessment

## 2022-10-31 ENCOUNTER — Encounter: Payer: Self-pay | Admitting: Internal Medicine

## 2022-10-31 ENCOUNTER — Ambulatory Visit: Payer: PPO | Admitting: Internal Medicine

## 2022-10-31 VITALS — BP 104/60 | HR 71 | Ht 62.0 in | Wt 161.0 lb

## 2022-10-31 DIAGNOSIS — F17210 Nicotine dependence, cigarettes, uncomplicated: Secondary | ICD-10-CM

## 2022-10-31 DIAGNOSIS — J9612 Chronic respiratory failure with hypercapnia: Secondary | ICD-10-CM

## 2022-10-31 DIAGNOSIS — J449 Chronic obstructive pulmonary disease, unspecified: Secondary | ICD-10-CM

## 2022-10-31 DIAGNOSIS — J9611 Chronic respiratory failure with hypoxia: Secondary | ICD-10-CM

## 2022-10-31 MED ORDER — BREZTRI AEROSPHERE 160-9-4.8 MCG/ACT IN AERO: 2.0000 | INHALATION_SPRAY | Freq: Two times a day (BID) | RESPIRATORY_TRACT | 0 refills | Status: DC

## 2022-10-31 MED ORDER — PREDNISONE 10 MG PO TABS: ORAL_TABLET | ORAL | 0 refills | Status: DC

## 2022-10-31 NOTE — Patient Instructions (Addendum)
For cough/ congestion >   mucinex dm  up to maximum of  1200 mg every 12 hours as needed    Prednisone 10 mg take  4 each am x 2 days,   2 each am x 2 days,  1 each am x 2 days and stop   Work on inhaler technique:  relax and gently blow all the way out then take a nice smooth full deep breath back in, triggering the inhaler at same time you start breathing in.  Hold breath in for at least  5 seconds if you can. Blow out breztri  thru nose. Rinse and gargle with water when done.  If mouth or throat bother you at all,  try brushing teeth/gums/tongue with arm and hammer toothpaste/ make a slurry and gargle and spit out.   >>>  Remember how golfers warm up by taking practice swings - do this with an empty inhaler   The key is to stop smoking completely before smoking completely stops you!  Please schedule a follow up visit in 3 months but call sooner if needed

## 2022-10-31 NOTE — Assessment & Plan Note (Addendum)
HC03  01/17/22  = 32  - 02/01/2022   Walked on RA  X 3   lap(s) =  approx 450  ft  @ mod pace, stopped due to end of study  with lowest 02 sats 94% no sob  - HC03  04/25/12  = 28   Hypercarbic component resolved, still using 02 2lpm hs and appears t benefit > no change rx         Each maintenance medication was reviewed in detail including emphasizing most importantly the difference between maintenance and prns and under what circumstances the prns are to be triggered using an action plan format where appropriate.  Total time for H and P, chart review, counseling, reviewing hfa/02 /neb  device(s) and generating customized AVS unique to this office visit / same day charting = 25 min

## 2022-10-31 NOTE — Assessment & Plan Note (Signed)
4-5 min discussion re active cigarette smoking in addition to office E&M  Ask about tobacco use:   ongoing Advise quitting   I took an extended  opportunity with this patient to outline the consequences of continued cigarette use  in airway disorders based on all the data we have from the multiple national lung health studies (perfomed over decades at millions of dollars in cost)  indicating that smoking cessation, not choice of inhalers or pulmonary  physicians, is the most important aspect of her  care.   Assess willingness:  Not committed at this point Assist in quit attempt:  Per PCP when ready Arrange follow up:   Follow up per Primary Care planned      

## 2022-10-31 NOTE — Assessment & Plan Note (Signed)
Active smoker s/p aecopd> admit 01/17/22 - 02/01/2022  continue breztri  - PFT's  05/24/22  FEV1 1.02 (49 % ) ratio 0.57  p 1 % improvement from saba p Breztri prior to study with DLCO  10.46 (57%)   and FV curve mildly concave     - 06/06/2022    75% (short Ti) > continue breztri  1-2 bid to reduce effects on oropharynx  - 10/31/2022  After extensive coaching inhaler device,  effectiveness =    60%    Group D (now reclassified as E) in terms of symptom/risk and laba/lama/ICS  therefore appropriate rx at this point >>>  breztri and approp saba  For cough > mucinex dm 1200 mg twice daily

## 2022-11-02 ENCOUNTER — Ambulatory Visit (HOSPITAL_COMMUNITY)
Admission: RE | Admit: 2022-11-02 | Discharge: 2022-11-02 | Disposition: A | Discharge status: Home / Self Care | Payer: PPO | Source: Ambulatory Visit | Attending: Family Medicine | Admitting: Family Medicine

## 2022-11-02 DIAGNOSIS — F17210 Nicotine dependence, cigarettes, uncomplicated: Secondary | ICD-10-CM | POA: Insufficient documentation

## 2022-11-02 DIAGNOSIS — J432 Centrilobular emphysema: Secondary | ICD-10-CM | POA: Insufficient documentation

## 2022-11-02 DIAGNOSIS — I2510 Atherosclerotic heart disease of native coronary artery without angina pectoris: Secondary | ICD-10-CM | POA: Diagnosis not present

## 2022-11-02 DIAGNOSIS — I700 Atherosclerosis of aorta: Secondary | ICD-10-CM | POA: Diagnosis not present

## 2022-11-02 DIAGNOSIS — R918 Other nonspecific abnormal finding of lung field: Secondary | ICD-10-CM | POA: Insufficient documentation

## 2022-11-02 DIAGNOSIS — Z122 Encounter for screening for malignant neoplasm of respiratory organs: Secondary | ICD-10-CM | POA: Insufficient documentation

## 2022-11-07 DIAGNOSIS — J449 Chronic obstructive pulmonary disease, unspecified: Secondary | ICD-10-CM | POA: Diagnosis not present

## 2022-11-07 DIAGNOSIS — Z6828 Body mass index (BMI) 28.0-28.9, adult: Secondary | ICD-10-CM | POA: Diagnosis not present

## 2022-11-07 DIAGNOSIS — M25552 Pain in left hip: Secondary | ICD-10-CM | POA: Diagnosis not present

## 2022-11-07 DIAGNOSIS — Z6829 Body mass index (BMI) 29.0-29.9, adult: Secondary | ICD-10-CM | POA: Diagnosis not present

## 2022-11-07 DIAGNOSIS — F17210 Nicotine dependence, cigarettes, uncomplicated: Secondary | ICD-10-CM | POA: Diagnosis not present

## 2022-11-07 DIAGNOSIS — E663 Overweight: Secondary | ICD-10-CM | POA: Diagnosis not present

## 2022-11-07 DIAGNOSIS — Z79899 Other long term (current) drug therapy: Secondary | ICD-10-CM | POA: Diagnosis not present

## 2022-11-07 DIAGNOSIS — R9389 Abnormal findings on diagnostic imaging of other specified body structures: Secondary | ICD-10-CM | POA: Diagnosis not present

## 2022-11-07 DIAGNOSIS — M542 Cervicalgia: Secondary | ICD-10-CM | POA: Diagnosis not present

## 2022-11-07 DIAGNOSIS — Z7951 Long term (current) use of inhaled steroids: Secondary | ICD-10-CM | POA: Diagnosis not present

## 2022-11-08 ENCOUNTER — Other Ambulatory Visit (HOSPITAL_COMMUNITY): Payer: Self-pay | Admitting: Family Medicine

## 2022-11-08 DIAGNOSIS — R9389 Abnormal findings on diagnostic imaging of other specified body structures: Secondary | ICD-10-CM

## 2022-11-08 DIAGNOSIS — J449 Chronic obstructive pulmonary disease, unspecified: Secondary | ICD-10-CM | POA: Diagnosis not present

## 2022-11-24 ENCOUNTER — Ambulatory Visit (HOSPITAL_COMMUNITY)
Admission: RE | Admit: 2022-11-24 | Discharge: 2022-11-24 | Disposition: A | Discharge status: Home / Self Care | Payer: PPO | Source: Ambulatory Visit | Attending: Family Medicine | Admitting: Family Medicine

## 2022-11-24 DIAGNOSIS — R9389 Abnormal findings on diagnostic imaging of other specified body structures: Secondary | ICD-10-CM | POA: Diagnosis not present

## 2022-11-24 DIAGNOSIS — R911 Solitary pulmonary nodule: Secondary | ICD-10-CM | POA: Diagnosis not present

## 2022-11-24 MED ORDER — FLUDEOXYGLUCOSE F - 18 (FDG) INJECTION
8.3200 | Freq: Once | INTRAVENOUS | Status: AC | PRN
  Administered 2022-11-24: 8.32 via INTRAVENOUS

## 2022-12-07 ENCOUNTER — Ambulatory Visit (HOSPITAL_COMMUNITY): Payer: PPO | Attending: Family Medicine | Admitting: Physical Therapy

## 2022-12-07 ENCOUNTER — Encounter (HOSPITAL_COMMUNITY): Payer: Self-pay | Admitting: Physical Therapy

## 2022-12-07 ENCOUNTER — Other Ambulatory Visit: Payer: Self-pay

## 2022-12-07 DIAGNOSIS — M25552 Pain in left hip: Secondary | ICD-10-CM | POA: Insufficient documentation

## 2022-12-07 DIAGNOSIS — R2689 Other abnormalities of gait and mobility: Secondary | ICD-10-CM | POA: Insufficient documentation

## 2022-12-07 DIAGNOSIS — M6281 Muscle weakness (generalized): Secondary | ICD-10-CM | POA: Diagnosis not present

## 2022-12-07 DIAGNOSIS — R29898 Other symptoms and signs involving the musculoskeletal system: Secondary | ICD-10-CM | POA: Diagnosis not present

## 2022-12-07 NOTE — Therapy (Signed)
OUTPATIENT PHYSICAL THERAPY LOWER EXTREMITY EVALUATION   Patient Name: Lori Lopez MRN: [id number] DOB:10/23/1950, 72 y.o., female Today's Date: 12/07/2022  END OF SESSION:  PT End of Session - 12/07/22 1347     Visit Number 1    Number of Visits 12    Date for PT Re-Evaluation 01/18/23    Authorization Type [employer]    Progress Note Due on Visit 10    PT Start Time 1347    PT Stop Time 1430    PT Time Calculation (min) 43 min    Activity Tolerance Patient tolerated treatment well    Behavior During Therapy [facility] for tasks assessed/performed             Past Medical History:  Diagnosis Date   Adjustment disorder with depressed mood 09/08/2009   Allergic rhinitis due to pollen 09/08/2009   Anxiety    Cigarette nicotine dependence, uncomplicated 05/25/2009   COPD (chronic obstructive pulmonary disease) (HCC) 11/05/2010   Degeneration of thoracolumbar intervertebral disc 06/13/2013   Degeneration, intervertebral disc, cervicothoracic 06/13/2013   Hypothyroidism    Mixed hyperlipidemia 05/15/2013   Obesity due to excess calories 05/15/2013   Tinea unguium 05/15/2013   Vitamin D deficiency 12/28/2009   Past Surgical History:  Procedure Laterality Date   BACK SURGERY     BIOPSY  11/07/2017   Procedure: BIOPSY;  Surgeon: West Bali, MD;  Location: [facility];  Service: Endoscopy;;  gastric biopsy    CERVICAL DISC SURGERY     COLONOSCOPY  2008   Dr. Darrick Penna: normal colon, small internal hemorrhoids.    COLONOSCOPY N/A 04/08/2016   Dr. Darrick Penna: examined portion of ileum normal. two 5-6 mm polyps in descending colon and transverse colon, three 2-4 mm polyps in rectum, (all hyperplastic), internal and external hemorrhoids. Repeat in 10 years   COLONOSCOPY WITH PROPOFOL N/A 09/22/2020   Procedure: COLONOSCOPY WITH PROPOFOL;  Surgeon: [name], DO;  Location: [facility];  Service: Endoscopy;  Laterality: N/A;  2:45pm   ESOPHAGOGASTRODUODENOSCOPY (EGD) WITH  PROPOFOL N/A 11/07/2017   Dr. Darrick Penna: Normal esophagus status post dilation.  Mild gastritis with benign biopsies   POLYPECTOMY  09/22/2020   Procedure: POLYPECTOMY;  Surgeon: [name], DO;  Location: [facility];  Service: Endoscopy;;   SAVORY DILATION N/A 11/07/2017   Procedure: SAVORY DILATION;  Surgeon: West Bali, MD;  Location: [facility];  Service: Endoscopy;  Laterality: N/A;   Patient Active Problem List   Diagnosis Date Noted   Hypokalemia 04/24/2022   AKI (acute kidney injury) (HCC) 04/24/2022   Chronic respiratory failure with hypoxia and hypercapnia (HCC) 02/02/2022   Acute exacerbation of chronic obstructive pulmonary disease (COPD) (HCC) 01/18/2022   Hyperglycemia 01/18/2022   Leukocytosis 01/18/2022   Tobacco abuse 01/18/2022   SIRS (systemic inflammatory response syndrome) (HCC) 01/18/2022   Hemorrhoids 06/30/2021   Abnormal CT scan, colon 07/28/2020   RUQ pain 07/08/2020   Abdominal hernia 07/08/2020   RUQ abdominal mass 03/17/2020   Abdominal pain 03/17/2020   Acute respiratory failure with hypoxia (HCC) 06/02/2018   Influenza A 06/02/2018   COPD with acute exacerbation (HCC) 06/02/2018   Hypothyroidism 06/02/2018   Sinus tachycardia 06/02/2018   GAD (generalized anxiety disorder) 06/02/2018   Elevated blood pressure reading 06/02/2018   Dysphagia 10/19/2017   GERD (gastroesophageal reflux disease) 04/12/2017   Constipation 03/10/2016   Rectal bleeding 03/10/2016   SUI (stress urinary incontinence, female) 10/13/2014   COPD GOLD 3  /  active smoker group E 11/05/2010   Cigarette smoker 05/25/2009    PCP: Benita Stabile MD  REFERRING PROVIDER: Lupita Raider, NP  REFERRING DIAG: [phone] (ICD-10-CM) - Pain in left hip  THERAPY DIAG:  Pain in left hip  Muscle weakness (generalized)  Other abnormalities of gait and mobility  Other symptoms and signs involving the musculoskeletal system  Rationale for Evaluation and Treatment:  Rehabilitation  ONSET DATE: several months  SUBJECTIVE:   SUBJECTIVE STATEMENT: Patient states hip pain that began with walking several months. Patient states hip starts bothering her with walking down incline and also with squatting. Has some pain in back and in back of hip. Has had to limit activity due to symptoms. Some pain in thigh and hamstring as well.   PERTINENT HISTORY: COPD PAIN:  Are you having pain? No  PRECAUTIONS: None  WEIGHT BEARING RESTRICTIONS: No  FALLS:  Has patient fallen in last 6 months? No  OCCUPATION: Retired  PLOF: Independent  PATIENT GOALS: get hip feeling better   OBJECTIVE:   PATIENT SURVEYS:  FOTO 67% function  COGNITION: Overall cognitive status: Within functional limits for tasks assessed     SENSATION: WFL   POSTURE: No Significant postural limitations  PALPATION: TTP R proximal adductors, rectus femoris, glute max  Lumbar AROM: WFL, no change in hip/leg symptoms, no back pain  LOWER EXTREMITY ROM:  Active ROM Right eval Left eval  Hip flexion    Hip extension    Hip abduction    Hip adduction    Hip internal rotation    Hip external rotation    Knee flexion    Knee extension    Ankle dorsiflexion    Ankle plantarflexion    Ankle inversion    Ankle eversion     (Blank rows = not tested)  LOWER EXTREMITY MMT:  MMT Right eval Left eval  Hip flexion 5 4  Hip extension 3+ 3+  Hip abduction 4 4-  Hip adduction    Hip internal rotation    Hip external rotation    Knee flexion 5 5  Knee extension 5 4+  Ankle dorsiflexion 5 5  Ankle plantarflexion    Ankle inversion    Ankle eversion     (Blank rows = not tested)   FUNCTIONAL TESTS:  5 times sit to stand: 9.90 seconds without UE support 2 minute walk test: 350 feet Stairs : 7 inch alternating; quad/hip/knee pain following Deep squat: unilateral UE support, gradually increasing L quad/anterior hip pain with increased reps  GAIT: Distance walked: 350  feet  Assistive device utilized: None Level of assistance: Complete Independence Comments: , gradually increasing L quad/knee pain, anterior hip pain; antalgic on L slightly with decreased L stance time and hip extension   TODAY'S TREATMENT:  DATE:  12/07/22 EVAL and HEP    PATIENT EDUCATION:  Education details: Patient educated on exam findings, POC, scope of PT, HEP, proper hydration/nutrition for cramp. Person educated: Patient Education method: Explanation, Demonstration, and Handouts Education comprehension: verbalized understanding, returned demonstration, verbal cues required, and tactile cues required  HOME EXERCISE PROGRAM: Access Code: [email or web address] URL: [email or web address]  Date: 12/07/2022 - Hooklying Gluteal Sets  - 2-3 x daily - 7 x weekly - 2 sets - 10 reps - 5 sec hold - Clamshell  - 2-3 x daily - 7 x weekly - 2 sets - 10 reps  ASSESSMENT:  CLINICAL IMPRESSION: Patient a 72 y.o. y.o. female who was seen today for physical therapy evaluation and treatment for L hip pain . Patient presents with pain limited deficits in L hip strength, ROM, endurance, activity tolerance, and functional mobility with ADL. Patient is having to modify and restrict ADL as indicated by outcome measure score as well as subjective information and objective measures which is affecting overall participation. Patient will benefit from skilled physical therapy in order to improve function and reduce impairment.  OBJECTIVE IMPAIRMENTS: Abnormal gait, decreased activity tolerance, decreased balance, decreased endurance, decreased mobility, difficulty walking, decreased ROM, improper body mechanics, and pain.   ACTIVITY LIMITATIONS: carrying, lifting, bending, standing, squatting, stairs, transfers, hygiene/grooming, locomotion level, and caring for  others  PARTICIPATION LIMITATIONS: meal prep, cleaning, laundry, shopping, community activity, and yard work  PERSONAL FACTORS: 1-2 comorbidities: COPD, Hx back pain/surgery  are also affecting patient's functional outcome.   REHAB POTENTIAL: Good  CLINICAL DECISION MAKING: Stable/uncomplicated  EVALUATION COMPLEXITY: Low   GOALS: Goals reviewed with patient? Yes  SHORT TERM GOALS: Target date: 12/28/2022    Patient will be independent with HEP in order to improve functional outcomes. Baseline: Goal status: INITIAL  2.  Patient will report at least 25% improvement in symptoms for improved quality of life. Baseline: Goal status: INITIAL    LONG TERM GOALS: Target date: 01/18/2023    Patient will report at least 75% improvement in symptoms for improved quality of life. Baseline:  Goal status: INITIAL  2.  Patient will improve FOTO score by at least 8 points in order to indicate improved tolerance to activity. Baseline: 67% function Goal status: INITIAL  3.  Patient will demonstrate grade of 4+/5 MMT grade in all tested musculature as evidence of improved strength to assist with stair ambulation and gait.   Baseline: see above Goal status: INITIAL  4.  Patient will be able to ambulate at least 425 feet in in order to demonstrate improved tolerance to activity. Baseline: 350 feet Goal status: INITIAL  5.  Patient will be able to deep squat 10 x without increase in symptoms for improved ability to work outside. Baseline: increase in symptoms after 5 reps Goal status: INITIAL    PLAN:  PT FREQUENCY: 2x/week  PT DURATION: 6 weeks  PLANNED INTERVENTIONS: Therapeutic exercises, Therapeutic activity, Neuromuscular re-education, Balance training, Gait training, Patient/Family education, Joint manipulation, Joint mobilization, Stair training, Orthotic/Fit training, DME instructions, Aquatic Therapy, Dry Needling, Electrical stimulation, Spinal manipulation, Spinal  mobilization, Cryotherapy, Moist heat, Compression bandaging, scar mobilization, Splintting, Taping, Traction, Ultrasound, Ionotophoresis 4mg /ml Dexamethasone, and Manual therapy  PLAN FOR NEXT SESSION: L quad/glute strength, L hip extension ROM/hip flexor length, adductor stretch; balance and functional strength;  f/u with cramps with changes in hydration/coffee consumption   Reola Mosher Dajane Valli, PT 12/07/2022, 2:35 PM

## 2022-12-09 DIAGNOSIS — J449 Chronic obstructive pulmonary disease, unspecified: Secondary | ICD-10-CM | POA: Diagnosis not present

## 2022-12-14 ENCOUNTER — Encounter (HOSPITAL_COMMUNITY): Payer: Self-pay

## 2022-12-14 ENCOUNTER — Ambulatory Visit (HOSPITAL_COMMUNITY): Payer: PPO | Attending: Family Medicine

## 2022-12-14 DIAGNOSIS — M6281 Muscle weakness (generalized): Secondary | ICD-10-CM | POA: Insufficient documentation

## 2022-12-14 DIAGNOSIS — R29898 Other symptoms and signs involving the musculoskeletal system: Secondary | ICD-10-CM | POA: Diagnosis not present

## 2022-12-14 DIAGNOSIS — R2689 Other abnormalities of gait and mobility: Secondary | ICD-10-CM | POA: Insufficient documentation

## 2022-12-14 DIAGNOSIS — M25552 Pain in left hip: Secondary | ICD-10-CM | POA: Diagnosis not present

## 2022-12-14 NOTE — Therapy (Signed)
OUTPATIENT PHYSICAL THERAPY LOWER EXTREMITY TREATMENT   Patient Name: Lori Lopez MRN: [id number] DOB:[birth year], 72 y.o., female Today's Date: 12/14/2022  END OF SESSION:  PT End of Session - 12/14/22 1210     Visit Number 2    Number of Visits 12    Date for PT Re-Evaluation 01/18/23    Authorization Type [employer]    Progress Note Due on Visit 10    PT Start Time 1132    PT Stop Time 1210    PT Time Calculation (min) 38 min    Activity Tolerance Patient tolerated treatment well    Behavior During Therapy [facility] for tasks assessed/performed              Past Medical History:  Diagnosis Date   Adjustment disorder with depressed mood 09/08/2009   Allergic rhinitis due to pollen 09/08/2009   Anxiety    Cigarette nicotine dependence, uncomplicated 05/25/2009   COPD (chronic obstructive pulmonary disease) (HCC) 11/05/2010   Degeneration of thoracolumbar intervertebral disc 06/13/2013   Degeneration, intervertebral disc, cervicothoracic 06/13/2013   Hypothyroidism    Mixed hyperlipidemia 05/15/2013   Obesity due to excess calories 05/15/2013   Tinea unguium 05/15/2013   Vitamin D deficiency 12/28/2009   Past Surgical History:  Procedure Laterality Date   BACK SURGERY     BIOPSY  11/07/2017   Procedure: BIOPSY;  Surgeon: West Bali, MD;  [facility];  Service: Endoscopy;;  gastric biopsy    CERVICAL DISC SURGERY     COLONOSCOPY  2008   Dr. Darrick Penna: normal colon, small internal hemorrhoids.    COLONOSCOPY N/A 04/08/2016   Dr. Darrick Penna: examined portion of ileum normal. two 5-6 mm polyps in descending colon and transverse colon, three 2-4 mm polyps in rectum, (all hyperplastic), internal and external hemorrhoids. Repeat in 10 years   COLONOSCOPY WITH PROPOFOL N/A 09/22/2020   Procedure: COLONOSCOPY WITH PROPOFOL;  Surgeon: [name], DO;  Location: [facility];  Service: Endoscopy;  Laterality: N/A;  2:45pm   ESOPHAGOGASTRODUODENOSCOPY (EGD) WITH  PROPOFOL N/A 11/07/2017   Dr. Darrick Penna: Normal esophagus status post dilation.  Mild gastritis with benign biopsies   POLYPECTOMY  09/22/2020   Procedure: POLYPECTOMY;  Surgeon: [name], DO;  [facility];  Service: Endoscopy;;   SAVORY DILATION N/A 11/07/2017   Procedure: SAVORY DILATION;  Surgeon: West Bali, MD;  [facility]: [facility];  Service: Endoscopy;  Laterality: N/A;   Patient Active Problem List   Diagnosis Date Noted   Hypokalemia 04/24/2022   AKI (acute kidney injury) (HCC) 04/24/2022   Chronic respiratory failure with hypoxia and hypercapnia (HCC) 02/02/2022   Acute exacerbation of chronic obstructive pulmonary disease (COPD) (HCC) 01/18/2022   Hyperglycemia 01/18/2022   Leukocytosis 01/18/2022   Tobacco abuse 01/18/2022   SIRS (systemic inflammatory response syndrome) (HCC) 01/18/2022   Hemorrhoids 06/30/2021   Abnormal CT scan, colon 07/28/2020   RUQ pain 07/08/2020   Abdominal hernia 07/08/2020   RUQ abdominal mass 03/17/2020   Abdominal pain 03/17/2020   Acute respiratory failure with hypoxia (HCC) 06/02/2018   Influenza A 06/02/2018   COPD with acute exacerbation (HCC) 06/02/2018   Hypothyroidism 06/02/2018   Sinus tachycardia 06/02/2018   GAD (generalized anxiety disorder) 06/02/2018   Elevated blood pressure reading 06/02/2018   Dysphagia 10/19/2017   GERD (gastroesophageal reflux disease) 04/12/2017   Constipation 03/10/2016   Rectal bleeding 03/10/2016   SUI (stress urinary incontinence, female) 10/13/2014   COPD GOLD  3  / active smoker group E 11/05/2010   Cigarette smoker 05/25/2009    PCP: Benita Stabile MD  REFERRING PROVIDER: Lupita Raider, NP  REFERRING DIAG: [phone] (ICD-10-CM) - Pain in left hip  THERAPY DIAG:  Pain in left hip  Muscle weakness (generalized)  Other abnormalities of gait and mobility  Other symptoms and signs involving the musculoskeletal system  Rationale for Evaluation and Treatment:  Rehabilitation  ONSET DATE: several months  SUBJECTIVE:   SUBJECTIVE STATEMENT: 12/14/22:  Reports she is feeling good today.  Most pain when walking for long periods of time or squatting.  Has began the exercises at home.  Reports increased hydration with no reports of change with cramping.  Eval:  Patient states hip pain that began with walking several months. Patient states hip starts bothering her with walking down incline and also with squatting. Has some pain in back and in back of hip. Has had to limit activity due to symptoms. Some pain in thigh and hamstring as well.   PERTINENT HISTORY: COPD PAIN:  Are you having pain? No  PRECAUTIONS: None  WEIGHT BEARING RESTRICTIONS: No  FALLS:  Has patient fallen in last 6 months? No  OCCUPATION: Retired  PLOF: Independent  PATIENT GOALS: get hip feeling better   OBJECTIVE:   PATIENT SURVEYS:  FOTO 67% function  COGNITION: Overall cognitive status: Within functional limits for tasks assessed     SENSATION: WFL   POSTURE: No Significant postural limitations  PALPATION: TTP R proximal adductors, rectus femoris, glute max  Lumbar AROM: WFL, no change in hip/leg symptoms, no back pain  LOWER EXTREMITY ROM:  Active ROM Right eval Left eval  Hip flexion    Hip extension    Hip abduction    Hip adduction    Hip internal rotation    Hip external rotation    Knee flexion    Knee extension    Ankle dorsiflexion    Ankle plantarflexion    Ankle inversion    Ankle eversion     (Blank rows = not tested)  LOWER EXTREMITY MMT:  MMT Right eval Left eval  Hip flexion 5 4  Hip extension 3+ 3+  Hip abduction 4 4-  Hip adduction    Hip internal rotation    Hip external rotation    Knee flexion 5 5  Knee extension 5 4+  Ankle dorsiflexion 5 5  Ankle plantarflexion    Ankle inversion    Ankle eversion     (Blank rows = not tested)   FUNCTIONAL TESTS:  5 times sit to stand: 9.90 seconds without UE  support 2 minute walk test: 350 feet Stairs : 7 inch alternating; quad/hip/knee pain following Deep squat: unilateral UE support, gradually increasing L quad/anterior hip pain with increased reps  GAIT: Distance walked: 350 feet  Assistive device utilized: None Level of assistance: Complete Independence Comments: , gradually increasing L quad/knee pain, anterior hip pain; antalgic on L slightly with decreased L stance time and hip extension   TODAY'S TREATMENT:  DATE:  12/14/22: Reviewed goals and educated importance of HEP compliance for maximal benefits  Sidelying clam with RTB 10x 5" Bil LE Supine: glut sets 10x 5"  -Bridge 10x 5" c/o cramping  -LTR 5x 10"  -Thomas stretch on EOB 3x 30" STS 10x eccentric control  12/07/22 EVAL and HEP    PATIENT EDUCATION:  Education details: Patient educated on exam findings, POC, scope of PT, HEP, proper hydration/nutrition for cramp. Person educated: Patient Education method: Explanation, Demonstration, and Handouts Education comprehension: verbalized understanding, returned demonstration, verbal cues required, and tactile cues required  HOME EXERCISE PROGRAM: Access Code: [email or web address] URL: [email or web address]  Date: 12/07/2022 - Hooklying Gluteal Sets  - 2-3 x daily - 7 x weekly - 2 sets - 10 reps - 5 sec hold - Clamshell  - 2-3 x daily - 7 x weekly - 2 sets - 10 reps  12/14/22:  -added RTB to clam - Supine Bridge  - 3 x daily - 7 x weekly - 1 sets - 10 reps - 5" hold - Hip Flexor Stretch at Edge of Bed  - 2 x daily - 7 x weekly - 1 sets - 3 reps - 30" hold - Supine Lower Trunk Rotation  - 1 x daily - 7 x weekly - 5 reps - 10" hold - Sit to Stand  - 2 x daily - 7 x weekly - 1 sets - 10 reps ASSESSMENT:  CLINICAL IMPRESSION: Reviewed goals and educated importance of HEP compliance, pt able to  recall with good form and mechanics.  Added theraband resistance and gluteal strengthening exercises to POC with good tolerance.  Session focus with proximal strengthening and hip mobility.  Pt did c/o lateral trunk and LE cramping through session.  No reports of increased pain through session.    Patient a 72 y.o. y.o. female who was seen today for physical therapy evaluation and treatment for L hip pain . Patient presents with pain limited deficits in L hip strength, ROM, endurance, activity tolerance, and functional mobility with ADL. Patient is having to modify and restrict ADL as indicated by outcome measure score as well as subjective information and objective measures which is affecting overall participation. Patient will benefit from skilled physical therapy in order to improve function and reduce impairment.  OBJECTIVE IMPAIRMENTS: Abnormal gait, decreased activity tolerance, decreased balance, decreased endurance, decreased mobility, difficulty walking, decreased ROM, improper body mechanics, and pain.   ACTIVITY LIMITATIONS: carrying, lifting, bending, standing, squatting, stairs, transfers, hygiene/grooming, locomotion level, and caring for others  PARTICIPATION LIMITATIONS: meal prep, cleaning, laundry, shopping, community activity, and yard work  PERSONAL FACTORS: 1-2 comorbidities: COPD, Hx back pain/surgery  are also affecting patient's functional outcome.   REHAB POTENTIAL: Good  CLINICAL DECISION MAKING: Stable/uncomplicated  EVALUATION COMPLEXITY: Low   GOALS: Goals reviewed with patient? Yes  SHORT TERM GOALS: Target date: 12/28/2022    Patient will be independent with HEP in order to improve functional outcomes. Baseline: Goal status: IN PROGRESS  2.  Patient will report at least 25% improvement in symptoms for improved quality of life. Baseline: Goal status: IN PROGRESS    LONG TERM GOALS: Target date: 01/18/2023    Patient will report at least 75%  improvement in symptoms for improved quality of life. Baseline:  Goal status: IN PROGRESS  2.  Patient will improve FOTO score by at least 8 points in order to indicate improved tolerance to activity. Baseline: 67% function Goal status: IN PROGRESS  3.  Patient will demonstrate grade  of 4+/5 MMT grade in all tested musculature as evidence of improved strength to assist with stair ambulation and gait.   Baseline: see above Goal status: IN PROGRESS  4.  Patient will be able to ambulate at least 425 feet in in order to demonstrate improved tolerance to activity. Baseline: 350 feet Goal status: IN PROGRESS  5.  Patient will be able to deep squat 10 x without increase in symptoms for improved ability to work outside. Baseline: increase in symptoms after 5 reps Goal status: IN PROGRESS    PLAN:  PT FREQUENCY: 2x/week  PT DURATION: 6 weeks  PLANNED INTERVENTIONS: Therapeutic exercises, Therapeutic activity, Neuromuscular re-education, Balance training, Gait training, Patient/Family education, Joint manipulation, Joint mobilization, Stair training, Orthotic/Fit training, DME instructions, Aquatic Therapy, Dry Needling, Electrical stimulation, Spinal manipulation, Spinal mobilization, Cryotherapy, Moist heat, Compression bandaging, scar mobilization, Splintting, Taping, Traction, Ultrasound, Ionotophoresis 4mg /ml Dexamethasone, and Manual therapy  PLAN FOR NEXT SESSION: L quad/glute strength, L hip extension ROM/hip flexor length, adductor stretch; balance and functional strength;  f/u with cramps with changes in hydration/coffee consumption  Becky Sax, LPTA/CLT; CBIS [phone] Juel Burrow, PTA 12/14/2022, 4:36 PM

## 2022-12-28 ENCOUNTER — Encounter (HOSPITAL_COMMUNITY): Payer: PPO

## 2022-12-30 ENCOUNTER — Encounter (HOSPITAL_COMMUNITY): Payer: PPO

## 2023-01-03 ENCOUNTER — Encounter (HOSPITAL_COMMUNITY): Payer: PPO

## 2023-01-05 ENCOUNTER — Encounter (HOSPITAL_COMMUNITY): Payer: PPO

## 2023-01-09 DIAGNOSIS — J449 Chronic obstructive pulmonary disease, unspecified: Secondary | ICD-10-CM | POA: Diagnosis not present

## 2023-01-10 ENCOUNTER — Encounter (HOSPITAL_COMMUNITY): Payer: PPO

## 2023-01-12 ENCOUNTER — Encounter (HOSPITAL_COMMUNITY): Payer: PPO

## 2023-01-16 ENCOUNTER — Encounter (HOSPITAL_COMMUNITY): Payer: PPO

## 2023-01-16 DIAGNOSIS — X58XXXA Exposure to other specified factors, initial encounter: Secondary | ICD-10-CM | POA: Diagnosis not present

## 2023-01-16 DIAGNOSIS — S76011A Strain of muscle, fascia and tendon of right hip, initial encounter: Secondary | ICD-10-CM | POA: Diagnosis not present

## 2023-01-16 DIAGNOSIS — R350 Frequency of micturition: Secondary | ICD-10-CM | POA: Diagnosis not present

## 2023-01-17 DIAGNOSIS — R350 Frequency of micturition: Secondary | ICD-10-CM | POA: Diagnosis not present

## 2023-01-18 ENCOUNTER — Encounter (HOSPITAL_COMMUNITY): Payer: PPO

## 2023-01-23 ENCOUNTER — Encounter (HOSPITAL_COMMUNITY): Payer: PPO

## 2023-01-25 ENCOUNTER — Encounter (HOSPITAL_COMMUNITY): Payer: PPO

## 2023-01-25 ENCOUNTER — Encounter (HOSPITAL_COMMUNITY): Payer: Self-pay

## 2023-01-25 NOTE — Therapy (Signed)
[facility] Outpatient Rehabilitation at [facility] [address] [place], [place], [address] Phone: [phone]   Fax:  [phone]  Patient Details  Name: Lori Lopez MRN: [id number] Date of Birth: 22-Jul-1950 Referring Provider:  No ref. provider found  Encounter Date: 01/25/2023  PHYSICAL THERAPY DISCHARGE SUMMARY  Visits from Start of Care: 2  Current functional level related to goals / functional outcomes: Unknown   Remaining deficits: See evaluation   Education / Equipment:    Patient agrees to discharge. Patient goals were not met. Patient is being discharged due to not returning since the last visit.   Nelida Meuse, PT 01/25/2023, 3:39 PM  [facility] Outpatient Rehabilitation at [facility] [address] [place], [place], [address] Phone: [phone]   Fax:  [phone]

## 2023-01-26 DIAGNOSIS — R202 Paresthesia of skin: Secondary | ICD-10-CM | POA: Diagnosis not present

## 2023-01-26 DIAGNOSIS — G5601 Carpal tunnel syndrome, right upper limb: Secondary | ICD-10-CM | POA: Diagnosis not present

## 2023-01-26 DIAGNOSIS — R200 Anesthesia of skin: Secondary | ICD-10-CM | POA: Insufficient documentation

## 2023-01-30 NOTE — Progress Notes (Unsigned)
Lori Lopez, female    DOB: 1950/10/05    MRN: [id number]   Brief patient profile:  77  yowf active smoker referred to pulmonary clinic in [place]  02/01/2022 by Dr Margo Aye for copd eval   Admit date: 01/17/2022 Discharge date: 01/19/2022      Brief Hospitalization Summary: Please see all hospital notes, images, labs for full details of the hospitalization.  ADMISSION HPI: 72 y.o. female with medical history significant of COPD, GERD, tobacco abuse who presents to the emergency department via EMS due to 4-day onset of shortness of breath, wheezing, chest congestion and nonproductive cough, she went to an urgent care on Friday (9/8) but still continued to wheeze and have chest congestion.  She used home nebulizer with only minimal relief.  She continues to smoke cigarettes.  She denies fever, chills, chest pain, headache, nausea, vomiting.  Patient was never intubated due to COPD exacerbation.   ED Course:  In the emergency department, she was tachycardic, and was hypoxic on arrival (84% on room air), she was placed on supplemental oxygen with improvement in O2 sats to 96% on supplemental oxygen via [place] at 3 LPM, other vital signs were within normal range.  Work-up in the ED showed leukocytosis, H/H-15.6/47.7, D-dimer < 0.27, BMP was normal except for glucose of 147.  SARS coronavirus 2 was negative.  Chest x-ray showed no active disease.  Breathing treatment was provided, Decadron 10 mg x 1 was given.  Hospitalist was asked to admit patient for further evaluation and management.   HOSPITAL COURSE BY PROBLEM    Acute respiratory failure with hypoxia due to acute exacerbation of COPD Pt responded well to scheduled duo nebs, Mucinex, Solu-Medrol, azithromycin. Continue Protonix to prevent steroid-induced ulcer Continue incentive spirometry and flutter valve Continue supplemental oxygen to maintain O2 sat > 94% with plan to wean patient off oxygen as tolerated (patient does not use oxygen at  baseline) Pt feeling better, DC home today, follow up with PCP in 1 week Pt will discharge on prednisone taper, nebulizers, doxycycline oral tabs  Pt will discharge on home oxygen arranged prior to discharge 2L/min     History of Present Illness  02/01/2022  Pulmonary/ 1st office eval/ Lori Lopez / [place] Office  Chief Complaint  Patient presents with   Consult    Consult for abnormal ct scan in June 2023  Has been on oxygen for about 2-3 weeks prn during day and mainly at night   Dyspnea:   very active prior to admit 100 ft to MB with a hill fine but not any more doing since d/c.  Says did use neb prior to admit but no maint resp rx or 02 Cough: white in am  Sleep: bed blocks/ 1 pillow  SABA use: hfa and neb  02 2lpm hs  Rec Plan A = Automatic = Always=    Breztri Take 2 puffs first thing in am and then another 2 puffs about 12 hours later.  Work on inhaler technique:    Plan B = Backup (to supplement plan A, not to replace it) Only use your albuterol inhaler as a rescue medication  Plan C = Crisis (instead of Plan B but only if Plan B stops working) - only use your albuterol nebulizer if you first try Plan B Ok to try albuterol 15 min before an activity (on alternating days)  that you know would usually make you short of breath   02 2lpm at bedtime plus Make sure you  check your oxygen saturation  AT  your highest level of activity (not after you stop)   to be sure it stays over 90%  Suggested e-cigs as an optional  "one way bridge"  Off all tobacco products      06/06/2022  f/u ov/[place] office/Lori Lopez re: GOLD 3 COPD  maint on breztri  Chief Complaint  Patient presents with   Follow-up    Breathing better since last ov  Has questions about advair inhaler   Dyspnea:  slow walking / not doing as much ex due to husband's dementia Cough: min white mostly in ams' - questions whether advair discus would be easier on mouth vs breztri full dose ( has been rinsing and gargling already)   Sleeping: bed blocks no resp SABA use: very little  02: 2lpm hs  Covid status: never vax / never infected  Lung cancer screening: Dr Scharlene Gloss office q June  Rec Try breztri Take 1 puff  first thing in am and then another 2 puffs about 12 hours later.  Work on inhaler technique:  If mouth still a problem > consider change to Stiolto (would need a visit to teach how to use though)   Use vapes in place of cigarettes and uncouple to smoking from the coffee      10/31/2022  6 m f/u ov/[place] office/Lori Lopez re: GOLD 3 copd maint on breztri  2 bid  Chief Complaint  Patient presents with   Follow-up    Breathing is about the same. She has occ sharp CP that comes and goes over the past few days.   Dyspnea:  slow walking limited by L hip pain  Cough: worse since 2 weeks . >  white mucus Sleeping: bed blocks no resp cc but wakes up rattling/ congested SABA use: none  02:  2lpm hs / not using daytime Lung cancer screening:  due 11/02/22  Midline cp with cough  Rec For cough/ congestion >   mucinex dm  up to maximum of  1200 mg every 12 hours as needed   Prednisone 10 mg take  4 each am x 2 days,   2 each am x 2 days,  1 each am x 2 days and stop  Work on inhaler technique:  The key is to stop smoking completely before smoking completely stops you!    01/31/2023  3 m f/u ov/ office/Lori Lopez re: *** maint on ***  No chief complaint on file.   Dyspnea:  *** Cough: *** Sleeping: ***   resp cc  SABA use: *** 02: ***  Lung cancer screening: ***   No obvious day to day or daytime variability or assoc excess/ purulent sputum or mucus plugs or hemoptysis or cp or chest tightness, subjective wheeze or overt sinus or hb symptoms.    Also denies any obvious fluctuation of symptoms with weather or environmental changes or other aggravating or alleviating factors except as outlined above   No unusual exposure hx or h/o childhood pna/ asthma or knowledge of premature birth.  Current  Allergies, Complete Past Medical History, Past Surgical History, Family History, and Social History were reviewed in [employer] record.  ROS  The following are not active complaints unless bolded Hoarseness, sore throat, dysphagia, dental problems, itching, sneezing,  nasal congestion or discharge of excess mucus or purulent secretions, ear ache,   fever, chills, sweats, unintended wt loss or wt gain, classically pleuritic or exertional cp,  orthopnea pnd or arm/hand swelling  or leg  swelling, presyncope, palpitations, abdominal pain, anorexia, nausea, vomiting, diarrhea  or change in bowel habits or change in bladder habits, change in stools or change in urine, dysuria, hematuria,  rash, arthralgias, visual complaints, headache, numbness, weakness or ataxia or problems with walking or coordination,  change in mood or  memory.        No outpatient medications have been marked as taking for the 01/31/23 encounter (Appointment) with Nyoka Cowden, MD.                    Past Medical History:  Diagnosis Date   Adjustment disorder with depressed mood 09/08/2009   Allergic rhinitis due to pollen 09/08/2009   Anxiety    Cigarette nicotine dependence, uncomplicated 05/25/2009   COPD (chronic obstructive pulmonary disease) (HCC) 11/05/2010   Degeneration of thoracolumbar intervertebral disc 06/13/2013   Degeneration, intervertebral disc, cervicothoracic 06/13/2013   Hypothyroidism    Mixed hyperlipidemia 05/15/2013   Obesity due to excess calories 05/15/2013   Tinea unguium 05/15/2013   Vitamin D deficiency 12/28/2009      Objective:    Wts  01/31/2023         *** 10/31/2022        161 06/06/2022        158   03/16/22 158 lb 6.4 oz (71.8 kg)  02/01/22 151 lb 9.6 oz (68.8 kg)  01/18/22 158 lb 15.2 oz (72.1 kg)    Vital signs reviewed  01/31/2023  - Note at rest 02 sats  ***% on ***   General appearance:    ***   Mild barrel  ***      Assessment

## 2023-01-31 ENCOUNTER — Encounter: Payer: Self-pay | Admitting: Internal Medicine

## 2023-01-31 ENCOUNTER — Ambulatory Visit: Payer: PPO | Admitting: Internal Medicine

## 2023-01-31 VITALS — BP 118/72 | HR 64 | Ht 62.0 in | Wt 161.0 lb

## 2023-01-31 DIAGNOSIS — J449 Chronic obstructive pulmonary disease, unspecified: Secondary | ICD-10-CM | POA: Diagnosis not present

## 2023-01-31 DIAGNOSIS — J9611 Chronic respiratory failure with hypoxia: Secondary | ICD-10-CM

## 2023-01-31 DIAGNOSIS — F17210 Nicotine dependence, cigarettes, uncomplicated: Secondary | ICD-10-CM

## 2023-01-31 DIAGNOSIS — J9612 Chronic respiratory failure with hypercapnia: Secondary | ICD-10-CM | POA: Diagnosis not present

## 2023-01-31 MED ORDER — BREZTRI AEROSPHERE 160-9-4.8 MCG/ACT IN AERO: 2.0000 | INHALATION_SPRAY | Freq: Two times a day (BID) | RESPIRATORY_TRACT | Status: DC

## 2023-01-31 NOTE — Assessment & Plan Note (Signed)
HC03  01/17/22  = 32  - 02/01/2022   Walked on RA  X 3   lap(s) =  approx 450  ft  @ mod pace, stopped due to end of study  with lowest 02 sats 94% no sob  - HC03  04/25/12  = 28   As of 01/31/2023  rx  2lpm and not needing during the day likely because the hip pain stops her first but advised : Make sure you check your oxygen saturation  AT  your highest level of activity (not after you stop)   to be sure it stays over 90% and if not then adjust  02 flow upward to maintain this level if needed but remember to turn it back to previous settings when you stop (to conserve your supply).

## 2023-01-31 NOTE — Patient Instructions (Signed)
Work on optimizing your  inhaler technique:  relax and gently blow all the way out then take a nice smooth full deep breath back in, triggering the inhaler at same time you start breathing in.  Hold breath in for at least  5 seconds if you can. Blow out breztri thru nose. Rinse and gargle with water when done.  If mouth or throat bother you at all,  try brushing teeth/gums/tongue with arm and hammer toothpaste/ make a slurry and gargle and spit out.   >>>  Remember how golfers warm up by taking practice swings - do this with an empty inhaler   Suggested e-cigs as an optional  "one way bridge"  Off all tobacco products    Please schedule a follow up visit in 6 months but call sooner if needed

## 2023-01-31 NOTE — Assessment & Plan Note (Signed)
Active smoker s/p aecopd> admit 01/17/22 - 02/01/2022  continue breztri  - PFT's  05/24/22  FEV1 1.02 (49 % ) ratio 0.57  p 1 % improvement from saba p Breztri prior to study with DLCO  10.46 (57%)   and FV curve mildly concave     - 06/06/2022    75% (short Ti) > continue breztri  1-2 bid to reduce effects on oropharynx  - 01/31/2023  After extensive coaching inhaler device,  effectiveness = 75% (short ti)      Group D (now reclassified as E) in terms of symptom/risk and laba/lama/ICS  therefore appropriate rx at this point >>>  breztri 2bid and approp saba

## 2023-01-31 NOTE — Assessment & Plan Note (Signed)
Counseled re importance of smoking cessation but did not meet time criteria for separate billing           Each maintenance medication was reviewed in detail including emphasizing most importantly the difference between maintenance and prns and under what circumstances the prns are to be triggered using an action plan format where appropriate.  Total time for H and P, chart review, counseling, reviewing hfa  device(s) and generating customized AVS unique to this office visit / same day charting = 23 min

## 2023-02-08 DIAGNOSIS — J449 Chronic obstructive pulmonary disease, unspecified: Secondary | ICD-10-CM | POA: Diagnosis not present

## 2023-02-17 ENCOUNTER — Encounter: Payer: Self-pay | Admitting: Gastroenterology

## 2023-02-17 ENCOUNTER — Ambulatory Visit: Payer: PPO | Admitting: Gastroenterology

## 2023-02-17 VITALS — BP 116/66 | HR 78 | Temp 97.6°F | Ht 62.0 in | Wt 160.8 lb

## 2023-02-17 DIAGNOSIS — K219 Gastro-esophageal reflux disease without esophagitis: Secondary | ICD-10-CM

## 2023-02-17 DIAGNOSIS — K625 Hemorrhage of anus and rectum: Secondary | ICD-10-CM | POA: Diagnosis not present

## 2023-02-17 DIAGNOSIS — K5900 Constipation, unspecified: Secondary | ICD-10-CM | POA: Diagnosis not present

## 2023-02-17 NOTE — Patient Instructions (Signed)
Try Linzess daily to every other day consistently. Take on empty stomach, do not eat for at least one hour after Linzess. If you have more than 4 stools in a day, skip Linzess the following day. Drink plenty of fluids to stay hydrated. Consider keeping a chart of how many stools you have each day and if hard, soft, watery and bring to your next appointment.  Monitor for further rectal bleeding. Continue pantoprazole once to twice daily for reflux and your hiatal hernia. Return office visit in four to six weeks.

## 2023-02-17 NOTE — Progress Notes (Signed)
GI Office Note    Referring Provider: Benita Stabile, MD Primary Care Physician:  Benita Stabile, MD  Primary Gastroenterologist: Hennie Duos. Marletta Lor, DO   Chief Complaint   Chief Complaint  Patient presents with   Follow-up    Issues with constipation, takes linzess prn but it gives her loose stools so she only takes it when she gets constipated. Also sees blood in her stool from time to time. States her hernia is also causing her some pain.    History of Present Illness   Lori Lopez is a 72 y.o. female presenting today for follow-up. Seen in February 2023.  History of rectal bleeding, GERD, abdominal pain, constipation.   Presents today with ongoing bowel concerns. When she takes Linzess daily, she can't go longer than 3 days due to diarrhea. She has urgency/incontinence. Instead takes Linzess only sporadically when needed. She can't stand taking miralax. She did not tolerate Amitiza daily due to diarrhea. Insurance at the time would not approve daily. If she does not take anything, her stools are small, incomplete often associated with straining. The last two months she notes some blood in the toilet water. She has hemorrhoids, one she has to reduce. Sometimes she has bleeding on the toilet tissue with this hemorrhoid. Heartburn doing ok taking pantoprazole every morning. Sometime has some epigastric pain which she contributes to her hiatal hernia. She reminds me that general surgery would not repair unless she quits smoking.     Colonoscopy 09/2020: - Non-bleeding internal hemorrhoids. - Diverticulosis in the sigmoid colon. - One 6 mm polyp in the proximal transverse colon, removed with a cold snare. Resected and retrieved. Tubular adenoma. - A single non-bleeding colonic angiodysplastic lesion. -next colonoscopy in five years.  EGD 11/2017: -No endoscopic visual abnormalities explain dysphagia possibly due to uncontrolled GERD -Gastritis status post biopsy  reactive gastropathy, no H. Pylori  Medications   Current Outpatient Medications  Medication Sig Dispense Refill   albuterol (VENTOLIN HFA) 108 (90 Base) MCG/ACT inhaler 2 puffs every 4 (four) hours as needed for shortness of breath or wheezing.     ALPRAZolam (XANAX) 1 MG tablet Take 0.5 mg by mouth daily as needed for anxiety.     B Complex-Biotin-FA (B-COMPLEX HIGH STRENGTH 10 PO) Take by mouth.     Budeson-Glycopyrrol-Formoterol (BREZTRI AEROSPHERE IN) Inhale 2 puffs into the lungs in the morning and at bedtime.     Cholecalciferol (VITAMIN D3) 1000 units CAPS Take 1,000 Units by mouth daily.     fluticasone (FLONASE) 50 MCG/ACT nasal spray Place 1 spray into both nostrils daily.     gabapentin (NEURONTIN) 300 MG capsule Take 1 capsule (300 mg total) by mouth 2 (two) times daily.     guaiFENesin-dextromethorphan (ROBITUSSIN DM) 100-10 MG/5ML syrup Take 5 mLs by mouth every 4 (four) hours as needed for cough. 118 mL 0   ipratropium-albuterol (DUONEB) 0.5-2.5 (3) MG/3ML SOLN Take 3 mLs by nebulization every 4 (four) hours as needed. 360 mL 1   levocetirizine (XYZAL) 5 MG tablet Take 5 mg by mouth at bedtime.     linaclotide (LINZESS) 72 MCG capsule Take 1 capsule (72 mcg total) by mouth daily as needed. 30 capsule 5   pantoprazole (PROTONIX) 40 MG tablet Take 40 mg by mouth 2 (two) times daily.     No current facility-administered medications for this visit.    Allergies   Allergies as of 02/17/2023 - Review Complete 02/17/2023  Allergen Reaction Noted   Shellfish-derived products Anaphylaxis 10/10/2014   Shrimp [shellfish allergy] Anaphylaxis 10/10/2014   Hydrocodone Hives and Itching 10/10/2014   Lyrica [pregabalin] Other (See Comments) 10/10/2014          Review of Systems   General: Negative for anorexia, weight loss, fever, chills, fatigue, weakness. ENT: Negative for hoarseness, difficulty swallowing , nasal congestion. CV: Negative for chest pain, angina, palpitations,  dyspnea on exertion, peripheral edema.  Respiratory: Negative for dyspnea at rest, dyspnea on exertion, cough, sputum, wheezing.  GI: See history of present illness. GU:  Negative for dysuria, hematuria, urinary incontinence, urinary frequency, nocturnal urination.  Endo: Negative for unusual weight change.     Physical Exam   BP 116/66 (BP Location: Right Arm, Patient Position: Sitting, Cuff Size: Normal)   Pulse 78   Temp 97.6 F (36.4 C) (Oral)   Ht 5\' 2"  (1.575 m)   Wt 160 lb 12.8 oz (72.9 kg)   SpO2 96%   BMI 29.41 kg/m    General: Well-nourished, well-developed in no acute distress.  Eyes: No icterus. Mouth: Oropharyngeal mucosa moist and pink   Abdomen: Bowel sounds are normal, nontender, nondistended, no hepatosplenomegaly or masses,  no abdominal bruits or hernia , no rebound or guarding.   Extremities: No lower extremity edema. No clubbing or deformities. Neuro: Alert and oriented x 4   Skin: Warm and dry, no jaundice.   Psych: Alert and cooperative, normal mood and affect.  Labs   08/2022: CBC, LFTs normal  Imaging Studies   No results found.  Assessment/Plan:   Constipation -Patient wanted to give Linzess another try stating that she had been quite sporadic lately with its usage. -Linzess 72 mcg daily to every other day consistently on an empty stomach, 1 hour before meal.  If she has more than 4 stools in a day she will skip Linzess the following day. -Keep stool journal -Return to the office in 4 to 6 weeks  GERD -Continue pantoprazole once to twice daily for reflux/hiatal hernia  Rectal bleeding -Likely hemorrhoid related -Last colonoscopy in 2022 -Continue to monitor for further rectal bleeding, if becomes more frequent, would consider updating colonoscopy    Leanna Battles. Melvyn Neth, MHS, PA-C [facility]

## 2023-02-22 ENCOUNTER — Ambulatory Visit: Payer: PPO | Admitting: Gastroenterology

## 2023-02-22 DIAGNOSIS — R7303 Prediabetes: Secondary | ICD-10-CM | POA: Diagnosis not present

## 2023-02-22 DIAGNOSIS — J449 Chronic obstructive pulmonary disease, unspecified: Secondary | ICD-10-CM | POA: Diagnosis not present

## 2023-02-28 ENCOUNTER — Other Ambulatory Visit (HOSPITAL_COMMUNITY): Payer: Self-pay | Admitting: Family Medicine

## 2023-02-28 DIAGNOSIS — K219 Gastro-esophageal reflux disease without esophagitis: Secondary | ICD-10-CM | POA: Diagnosis not present

## 2023-02-28 DIAGNOSIS — I700 Atherosclerosis of aorta: Secondary | ICD-10-CM | POA: Diagnosis not present

## 2023-02-28 DIAGNOSIS — J449 Chronic obstructive pulmonary disease, unspecified: Secondary | ICD-10-CM | POA: Diagnosis not present

## 2023-02-28 DIAGNOSIS — F411 Generalized anxiety disorder: Secondary | ICD-10-CM | POA: Diagnosis not present

## 2023-02-28 DIAGNOSIS — S76011D Strain of muscle, fascia and tendon of right hip, subsequent encounter: Secondary | ICD-10-CM | POA: Diagnosis not present

## 2023-02-28 DIAGNOSIS — K5904 Chronic idiopathic constipation: Secondary | ICD-10-CM | POA: Diagnosis not present

## 2023-02-28 DIAGNOSIS — J4489 Other specified chronic obstructive pulmonary disease: Secondary | ICD-10-CM | POA: Diagnosis not present

## 2023-02-28 DIAGNOSIS — Z23 Encounter for immunization: Secondary | ICD-10-CM | POA: Diagnosis not present

## 2023-02-28 DIAGNOSIS — D751 Secondary polycythemia: Secondary | ICD-10-CM | POA: Diagnosis not present

## 2023-02-28 DIAGNOSIS — E785 Hyperlipidemia, unspecified: Secondary | ICD-10-CM | POA: Diagnosis not present

## 2023-02-28 DIAGNOSIS — J439 Emphysema, unspecified: Secondary | ICD-10-CM | POA: Diagnosis not present

## 2023-02-28 DIAGNOSIS — S76011A Strain of muscle, fascia and tendon of right hip, initial encounter: Secondary | ICD-10-CM | POA: Diagnosis not present

## 2023-02-28 DIAGNOSIS — Z1231 Encounter for screening mammogram for malignant neoplasm of breast: Secondary | ICD-10-CM

## 2023-03-16 ENCOUNTER — Other Ambulatory Visit: Payer: Self-pay | Admitting: Gastroenterology

## 2023-03-31 ENCOUNTER — Ambulatory Visit: Payer: PPO | Admitting: Gastroenterology

## 2023-03-31 ENCOUNTER — Encounter: Payer: Self-pay | Admitting: Gastroenterology

## 2023-03-31 VITALS — BP 107/57 | HR 82 | Temp 97.5°F | Ht 62.0 in | Wt 160.0 lb

## 2023-03-31 DIAGNOSIS — K625 Hemorrhage of anus and rectum: Secondary | ICD-10-CM | POA: Diagnosis not present

## 2023-03-31 DIAGNOSIS — K5900 Constipation, unspecified: Secondary | ICD-10-CM

## 2023-03-31 DIAGNOSIS — K642 Third degree hemorrhoids: Secondary | ICD-10-CM | POA: Diagnosis not present

## 2023-03-31 DIAGNOSIS — K219 Gastro-esophageal reflux disease without esophagitis: Secondary | ICD-10-CM | POA: Diagnosis not present

## 2023-03-31 NOTE — Patient Instructions (Signed)
Start taking miralax two capfuls (or two packets) in 20 ounces of water each day until you pass good soft stool. Then decrease to one capful (or one packet) once daily. Please call if you abdominal pain and constipation don't improve. Continue Prep H as needed for hemorrhoid pain/irritation. Return to the office in 3 months or sooner if needed. If you are doing well you cal cancel the appointment.  Next colonoscopy due in 09/2025 unless you symptoms don't improve then we would do one sooner.

## 2023-03-31 NOTE — Progress Notes (Signed)
GI Office Note    Referring Provider: Benita Stabile, MD Primary Care Physician:  Benita Stabile, MD  Primary Gastroenterologist: Hennie Duos. Marletta Lor, DO   Chief Complaint   Chief Complaint  Patient presents with   Follow-up    Still having issues with constipation.    History of Present Illness   Lori Lopez is a 72 y.o. female presenting today for follow-up.  Last seen in October 2024.  History of rectal bleeding, GERD, abdominal pain, constipation.  After last ov, patient wanted to try Linzess 72 mcg daily to every other day consistently to manage her constipation.  Only holding Linzess if greater than 4 stools in 24 hours.  We advised her to keep a stool journal.  Presents today stating Linzess did not work. She took it for 7 days straight without a BM. Going multiple days without a stool. Still having to strain which flares her hemorrhoids. She has to reduce one of them. Had three small stools today but still with no relief. Has had some intermittent right sided abdominal pain over the last one week. Thinks it may be constipation. Reflux well controlled, taking pantoprazole once daily.   Colonoscopy 09/2020: - Non-bleeding internal hemorrhoids. - Diverticulosis in the sigmoid colon. - One 6 mm polyp in the proximal transverse colon, removed with a cold snare. Resected and retrieved. Tubular adenoma. - A single non-bleeding colonic angiodysplastic lesion. -next colonoscopy in five years.   EGD 11/2017: -No endoscopic visual abnormalities explain dysphagia possibly due to uncontrolled GERD -Gastritis status post biopsy reactive gastropathy, no H. Pylori  Medications   Current Outpatient Medications  Medication Sig Dispense Refill   albuterol (VENTOLIN HFA) 108 (90 Base) MCG/ACT inhaler 2 puffs every 4 (four) hours as needed for shortness of breath or wheezing.     ALPRAZolam (XANAX) 1 MG tablet Take 0.5 mg by mouth daily as needed for anxiety.     B Complex-Biotin-FA  (B-COMPLEX HIGH STRENGTH 10 PO) Take by mouth.     Budeson-Glycopyrrol-Formoterol (BREZTRI AEROSPHERE IN) Inhale 2 puffs into the lungs in the morning and at bedtime.     Cholecalciferol (VITAMIN D3) 1000 units CAPS Take 1,000 Units by mouth daily.     fluticasone (FLONASE) 50 MCG/ACT nasal spray Place 1 spray into both nostrils daily.     gabapentin (NEURONTIN) 300 MG capsule Take 1 capsule (300 mg total) by mouth 2 (two) times daily.     ipratropium-albuterol (DUONEB) 0.5-2.5 (3) MG/3ML SOLN Take 3 mLs by nebulization every 4 (four) hours as needed. 360 mL 1   levocetirizine (XYZAL) 5 MG tablet Take 5 mg by mouth at bedtime.     pantoprazole (PROTONIX) 40 MG tablet Take 40 mg by mouth daily.     No current facility-administered medications for this visit.    Allergies   Allergies as of 03/31/2023 - Review Complete 03/31/2023  Allergen Reaction Noted   Shellfish-derived products Anaphylaxis 10/10/2014   Shrimp [shellfish allergy] Anaphylaxis 10/10/2014   Hydrocodone Hives and Itching 10/10/2014   Lyrica [pregabalin] Other (See Comments) 10/10/2014        Review of Systems   General: Negative for anorexia, weight loss, fever, chills, fatigue, weakness. ENT: Negative for hoarseness, difficulty swallowing , nasal congestion. CV: Negative for chest pain, angina, palpitations, dyspnea on exertion, peripheral edema.  Respiratory: Negative for dyspnea at rest, dyspnea on exertion, cough, sputum, wheezing.  GI: See history of present illness. GU:  Negative for dysuria, hematuria, urinary incontinence,  urinary frequency, nocturnal urination.  Endo: Negative for unusual weight change.     Physical Exam   BP (!) 107/57 (BP Location: Right Arm, Patient Position: Sitting, Cuff Size: Normal)   Pulse 82   Temp (!) 97.5 F (36.4 C) (Oral)   Ht 5\' 2"  (1.575 m)   Wt 160 lb (72.6 kg)   SpO2 92%   BMI 29.26 kg/m    General: Well-nourished, well-developed in no acute distress.  Eyes: No  icterus. Mouth: Oropharyngeal mucosa moist and pink   Abdomen: Bowel sounds are normal,  nondistended, no hepatosplenomegaly or masses,  no abdominal bruits or hernia , no rebound or guarding. Mild rlq tenderness, no rebound or guarding Rectal: not performed  Extremities: No lower extremity edema. No clubbing or deformities. Neuro: Alert and oriented x 4   Skin: Warm and dry, no jaundice.   Psych: Alert and cooperative, normal mood and affect.  Labs  None available Imaging Studies   No results found.  Assessment/Plan:   Constipation -poorly controlled -miralax two capfuls daily until soft stool then continue one capful daily, she will call if BMs don't improve significantly -avoid straining due to hemorrhoids -return ov in 3 months or sooner if needed. If symptoms don't improve, consider further work up.  GERD -well controlled on pantoprazole once daily  Hemorrhoids -intermittent brbpr with straining, describes prolapsing hemorrhoid that she has to reduce. -avoid straining with better management of constipation -if rectal bleeding more frequent or persistent, consider updating colonoscopy. Patient is aware.      Leanna Battles. Melvyn Neth, MHS, PA-C [facility]

## 2023-06-26 ENCOUNTER — Other Ambulatory Visit (HOSPITAL_COMMUNITY): Payer: PPO

## 2023-06-30 ENCOUNTER — Ambulatory Visit: Payer: PPO | Admitting: Gastroenterology

## 2023-07-04 ENCOUNTER — Encounter: Payer: Self-pay | Admitting: Gastroenterology

## 2023-07-04 ENCOUNTER — Ambulatory Visit: Payer: PPO | Admitting: Gastroenterology

## 2023-07-04 VITALS — BP 120/53 | HR 76 | Temp 98.9°F | Ht 62.0 in | Wt 153.6 lb

## 2023-07-04 DIAGNOSIS — K5900 Constipation, unspecified: Secondary | ICD-10-CM

## 2023-07-04 DIAGNOSIS — K625 Hemorrhage of anus and rectum: Secondary | ICD-10-CM

## 2023-07-04 NOTE — H&P (View-Only) (Signed)
 GI Office Note    Referring Provider: Benita Stabile, MD Primary Care Physician:  Benita Stabile, MD  Primary Gastroenterologist: Hennie Duos. Marletta Lor, DO   Chief Complaint   Chief Complaint  Patient presents with   Follow-up    Still having issues with blood in stool    History of Present Illness   Lori Lopez is a 73 y.o. female presenting today for follow up.  Last seen November 2024.  History of rectal bleeding, GERD, abdominal pain, constipation.  At last ov, noted that she failed Linzess 72 mcg daily. Previously did not tolerate Amitiza due to diarrhea and insurance would not pay for . We switched to MiraLAX 2 capfuls daily until soft stool, then continue once daily.  Advised to call if this did not control her constipation.  Intermittent rectal bleeding in the setting of straining, likely hemorrhoidal.  Advised if symptoms became more frequent or persistent, consider updating colonoscopy.  Today:    BM every other day to every day.  Using MiraLAX 1-2 capfuls daily.  Does not strain.  Stools somewhat incomplete. At times drips blood into the toilet. No rectal pain. No abdominal pain. Changed diet, lost about 9 pounds intentionally.  Cut out fried foods.  Heartburn well-controlled.  On oxygen at night, 2 L.  Labs from October 2024: White blood cell count 7900, hemoglobin 13.9, platelets 333,000, glucose 95, creatinine 1.11, albumin 4.1, total bilirubin 0.4, alkaline phosphatase 67, AST 14, ALT 11, A1c 5.7.   PET scan July 2024 for initial treatment strategy for right middle lobe pulmonary nodule IMPRESSION: 1. No significant FDG uptake associated with the somewhat bandlike 1.2 cm solid pulmonary nodule in the medial right middle lobe, favoring a benign etiology. Recommend resuming screening chest CT program in 12 months. 2. No hypermetabolic thoracic adenopathy or distant metastatic disease. 3. Nonspecific anal hypermetabolism without discrete CT  mass correlate, most commonly physiologic, suggest correlation with directed clinical history and exam. 4. Aortic Atherosclerosis (ICD10-I70.0) and Emphysema (ICD10-J43.9).   Colonoscopy 09/2020: - Non-bleeding internal hemorrhoids. - Diverticulosis in the sigmoid colon. - One 6 mm polyp in the proximal transverse colon, removed with a cold snare. Resected and retrieved. Tubular adenoma. - A single non-bleeding colonic angiodysplastic lesion. -next colonoscopy in five years.   EGD 11/2017: -No endoscopic visual abnormalities explain dysphagia possibly due to uncontrolled GERD -Gastritis status post biopsy reactive gastropathy, no H. Pylori   Medications   Current Outpatient Medications  Medication Sig Dispense Refill   albuterol (VENTOLIN HFA) 108 (90 Base) MCG/ACT inhaler 2 puffs every 4 (four) hours as needed for shortness of breath or wheezing.     ALPRAZolam (XANAX) 1 MG tablet Take 0.5 mg by mouth daily as needed for anxiety.     B Complex-Biotin-FA (B-COMPLEX HIGH STRENGTH 10 PO) Take by mouth.     Budeson-Glycopyrrol-Formoterol (BREZTRI AEROSPHERE IN) Inhale 2 puffs into the lungs in the morning and at bedtime.     Cholecalciferol (VITAMIN D3) 1000 units CAPS Take 1,000 Units by mouth daily.     fluticasone (FLONASE) 50 MCG/ACT nasal spray Place 1 spray into both nostrils daily.     gabapentin (NEURONTIN) 300 MG capsule Take 1 capsule (300 mg total) by mouth 2 (two) times daily.     ipratropium-albuterol (DUONEB) 0.5-2.5 (3) MG/3ML SOLN Take 3 mLs by nebulization every 4 (four) hours as needed. 360 mL 1   levocetirizine (XYZAL) 5 MG tablet Take 5 mg by mouth at bedtime.  pantoprazole (PROTONIX) 40 MG tablet Take 40 mg by mouth daily.     No current facility-administered medications for this visit.    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 Darrick Penna: normal colon, small internal hemorrhoids.    COLONOSCOPY N/A 04/08/2016   Dr. Darrick Penna: examined portion of ileum normal. two 5-6 mm polyps in descending colon and transverse colon, three 2-4 mm polyps in rectum, (all hyperplastic), internal and external hemorrhoids. Repeat in 10 years   COLONOSCOPY WITH PROPOFOL N/A 09/22/2020   Procedure: COLONOSCOPY WITH PROPOFOL;  Surgeon: [name], DO;  Location: [facility];  Service: Endoscopy;  Laterality: N/A;  2:45pm   ESOPHAGOGASTRODUODENOSCOPY (EGD) WITH PROPOFOL N/A 11/07/2017   Dr. Darrick Penna: Normal esophagus status post dilation.  Mild gastritis with benign biopsies   POLYPECTOMY  09/22/2020   Procedure: POLYPECTOMY;  Surgeon: [name], DO;  [facility]: [facility];  Service: Endoscopy;;   SAVORY DILATION N/A 11/07/2017   Procedure: SAVORY DILATION;  Surgeon: West Bali, MD;  Location: [facility];  Service: Endoscopy;  Laterality: N/A;    Past  Family History   Family History  Problem Relation Age of Onset   Diabetes Mother    CAD Mother    Heart disease Mother    CAD Father    Colon cancer Neg Hx    Colon polyps Neg Hx     Past Social History   Social History   Socioeconomic History   Marital status: Widowed    Spouse name: Not on file   Number of children: Not on file   Years of education: Not on file   Highest education level: Not on file  Occupational History   Not on file  Tobacco Use   Smoking status: Every Day    Current packs/day: 0.50    Average packs/day: 0.5 packs/day for 60.2 years (30.1 ttl pk-yrs)    Types: Cigarettes    Start date: 1965   Smokeless tobacco: Never   Tobacco comments:    vapes  Vaping Use   Vaping status: Every Day   Substances: Flavoring  Substance and Sexual Activity   Alcohol use: No    Alcohol/week: 0.0 standard drinks of alcohol   Drug use: No   Sexual activity: Yes    Birth control/protection: Surgical  Other Topics Concern   Not on file  Social History Narrative   Not on file   Social Drivers of Health   Financial Resource Strain: Not on file  Food Insecurity: Low Risk  (07/15/2022)   [facility]   Hunger Vital Sign    Within the past 12 months, you worried that your food would run out before you got money to buy more: Not on file    Ran Out of Food in the Last Year: Never true  Transportation Needs: No Transportation Needs (07/15/2022)   [facility]   Transportation    In the past 12 months, has lack of reliable transportation kept you from medical appointments, meetings, work or from getting things needed for daily living? : No  Physical Activity: Not on file  Stress: Not on file  Social Connections: Not on file  Intimate Partner Violence: Not At Risk (04/24/2022)   Humiliation, Afraid, Rape, and Kick questionnaire    Fear of Current or Ex-Partner: No    Emotionally Abused: No    Physically  Abused: No    Sexually Abused: No    Review of Systems   General: Negative for anorexia, unintentional weight loss, fever, chills, fatigue, weakness. ENT: Negative for hoarseness, difficulty swallowing , nasal congestion. CV: Negative for chest pain, angina, palpitations, positive dyspnea on exertion, peripheral edema.  Respiratory: Negative for dyspnea at rest, positive dyspnea on exertion, cough, sputum, positive wheezing.  GI: See history of present illness. GU:  Negative for dysuria, hematuria, urinary incontinence, urinary frequency, nocturnal urination.  Endo: Negative for unusual weight change.     Physical Exam   BP (!) 120/53 (BP Location: Right Arm, Patient Position: Sitting, Cuff Size: Normal)   Pulse 76   Temp 98.9 F (37.2 C) (Oral)   Ht 5\' 2"  (1.575 m)   Wt 153 lb 9.6 oz (69.7 kg)   SpO2 91%   BMI 28.09 kg/m    General: Somewhat chronically ill-appearing female in no acute distress.  Eyes: No icterus. Mouth: Oropharyngeal mucosa moist and pink  Lungs: Bilateral expiratory wheezes Heart: Regular rate and rhythm, no murmurs rubs or gallops.  Abdomen: Bowel sounds are normal, nontender, nondistended, no hepatosplenomegaly or masses,  no abdominal bruits or hernia , no rebound or guarding.  Rectal: Not performed Extremities: No lower extremity edema. No clubbing or deformities. Neuro: Alert and oriented x 4   Skin: Warm and dry, no jaundice.   Psych: Alert and cooperative, normal mood and affect.  Labs   See HPI Imaging Studies   No results found.  Assessment/Plan:   Constipation: -Continue MiraLAX 1-2 capfuls daily -Increase dietary fiber  Rectal bleeding: -Last colonoscopy May 2022 as outlined above. -PET scan 2024 ordered by her PCP to follow up on pulmonary nodule showed nonspecific anal hypermetabolism without discrete CT mass correlate, most commonly physiologic, suggest correlation with directed clinical history and exam. -Update colonoscopy at  this time.  ASA 3.  I have discussed the risks, alternatives, benefits with regards to but not limited to the risk of reaction to medication, bleeding, infection, perforation and the patient is agreeable to proceed. Written consent to be obtained.   Leanna Battles. Melvyn Neth, MHS, PA-C [facility]

## 2023-07-04 NOTE — Patient Instructions (Signed)
 Continue miralax one to two capfuls daily for constipation. Colonoscopy to be scheduled. Separate instructions to be provided.

## 2023-07-04 NOTE — Progress Notes (Signed)
 GI Office Note    Referring Provider: Benita Stabile, MD Primary Care Physician:  Benita Stabile, MD  Primary Gastroenterologist: Hennie Duos. Marletta Lor, DO   Chief Complaint   Chief Complaint  Patient presents with   Follow-up    Still having issues with blood in stool    History of Present Illness   Lori Lopez is a 73 y.o. female presenting today for follow up.  Last seen November 2024.  History of rectal bleeding, GERD, abdominal pain, constipation.  At last ov, noted that she failed Linzess 72 mcg daily. Previously did not tolerate Amitiza due to diarrhea and insurance would not pay for . We switched to MiraLAX 2 capfuls daily until soft stool, then continue once daily.  Advised to call if this did not control her constipation.  Intermittent rectal bleeding in the setting of straining, likely hemorrhoidal.  Advised if symptoms became more frequent or persistent, consider updating colonoscopy.  Today:    BM every other day to every day.  Using MiraLAX 1-2 capfuls daily.  Does not strain.  Stools somewhat incomplete. At times drips blood into the toilet. No rectal pain. No abdominal pain. Changed diet, lost about 9 pounds intentionally.  Cut out fried foods.  Heartburn well-controlled.  On oxygen at night, 2 L.  Labs from October 2024: White blood cell count 7900, hemoglobin 13.9, platelets 333,000, glucose 95, creatinine 1.11, albumin 4.1, total bilirubin 0.4, alkaline phosphatase 67, AST 14, ALT 11, A1c 5.7.   PET scan July 2024 for initial treatment strategy for right middle lobe pulmonary nodule IMPRESSION: 1. No significant FDG uptake associated with the somewhat bandlike 1.2 cm solid pulmonary nodule in the medial right middle lobe, favoring a benign etiology. Recommend resuming screening chest CT program in 12 months. 2. No hypermetabolic thoracic adenopathy or distant metastatic disease. 3. Nonspecific anal hypermetabolism without discrete CT  mass correlate, most commonly physiologic, suggest correlation with directed clinical history and exam. 4. Aortic Atherosclerosis (ICD10-I70.0) and Emphysema (ICD10-J43.9).   Colonoscopy 09/2020: - Non-bleeding internal hemorrhoids. - Diverticulosis in the sigmoid colon. - One 6 mm polyp in the proximal transverse colon, removed with a cold snare. Resected and retrieved. Tubular adenoma. - A single non-bleeding colonic angiodysplastic lesion. -next colonoscopy in five years.   EGD 11/2017: -No endoscopic visual abnormalities explain dysphagia possibly due to uncontrolled GERD -Gastritis status post biopsy reactive gastropathy, no H. Pylori   Medications   Current Outpatient Medications  Medication Sig Dispense Refill   albuterol (VENTOLIN HFA) 108 (90 Base) MCG/ACT inhaler 2 puffs every 4 (four) hours as needed for shortness of breath or wheezing.     ALPRAZolam (XANAX) 1 MG tablet Take 0.5 mg by mouth daily as needed for anxiety.     B Complex-Biotin-FA (B-COMPLEX HIGH STRENGTH 10 PO) Take by mouth.     Budeson-Glycopyrrol-Formoterol (BREZTRI AEROSPHERE IN) Inhale 2 puffs into the lungs in the morning and at bedtime.     Cholecalciferol (VITAMIN D3) 1000 units CAPS Take 1,000 Units by mouth daily.     fluticasone (FLONASE) 50 MCG/ACT nasal spray Place 1 spray into both nostrils daily.     gabapentin (NEURONTIN) 300 MG capsule Take 1 capsule (300 mg total) by mouth 2 (two) times daily.     ipratropium-albuterol (DUONEB) 0.5-2.5 (3) MG/3ML SOLN Take 3 mLs by nebulization every 4 (four) hours as needed. 360 mL 1   levocetirizine (XYZAL) 5 MG tablet Take 5 mg by mouth at bedtime.  pantoprazole (PROTONIX) 40 MG tablet Take 40 mg by mouth daily.     No current facility-administered medications for this visit.    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 Darrick Penna: normal colon, small internal hemorrhoids.    COLONOSCOPY N/A 04/08/2016   Dr. Darrick Penna: examined portion of ileum normal. two 5-6 mm polyps in descending colon and transverse colon, three 2-4 mm polyps in rectum, (all hyperplastic), internal and external hemorrhoids. Repeat in 10 years   COLONOSCOPY WITH PROPOFOL N/A 09/22/2020   Procedure: COLONOSCOPY WITH PROPOFOL;  Surgeon: [name], DO;  Location: [facility];  Service: Endoscopy;  Laterality: N/A;  2:45pm   ESOPHAGOGASTRODUODENOSCOPY (EGD) WITH PROPOFOL N/A 11/07/2017   Dr. Darrick Penna: Normal esophagus status post dilation.  Mild gastritis with benign biopsies   POLYPECTOMY  09/22/2020   Procedure: POLYPECTOMY;  Surgeon: [name], DO;  Location: [facility];  Service: Endoscopy;;   SAVORY DILATION N/A 11/07/2017   Procedure: SAVORY DILATION;  Surgeon: West Bali, MD;  Location: [facility];  Service: Endoscopy;  Laterality: N/A;    Past  Family History   Family History  Problem Relation Age of Onset   Diabetes Mother    CAD Mother    Heart disease Mother    CAD Father    Colon cancer Neg Hx    Colon polyps Neg Hx     Past Social History   Social History   Socioeconomic History   Marital status: Widowed    Spouse name: Not on file   Number of children: Not on file   Years of education: Not on file   Highest education level: Not on file  Occupational History   Not on file  Tobacco Use   Smoking status: Every Day    Current packs/day: 0.50    Average packs/day: 0.5 packs/day for 60.2 years (30.1 ttl pk-yrs)    Types: Cigarettes    Start date: 1965   Smokeless tobacco: Never   Tobacco comments:    vapes  Vaping Use   Vaping status: Every Day   Substances: Flavoring  Substance and Sexual Activity   Alcohol use: No    Alcohol/week: 0.0 standard drinks of alcohol   Drug use: No   Sexual activity: Yes    Birth control/protection: Surgical  Other Topics Concern   Not on file  Social History Narrative   Not on file   Social Drivers of Health   Financial Resource Strain: Not on file  Food Insecurity: Low Risk  (07/15/2022)   [facility]   Hunger Vital Sign    Within the past 12 months, you worried that your food would run out before you got money to buy more: Not on file    Ran Out of Food in the Last Year: Never true  Transportation Needs: No Transportation Needs (07/15/2022)   [facility]   Transportation    In the past 12 months, has lack of reliable transportation kept you from medical appointments, meetings, work or from getting things needed for daily living? : No  Physical Activity: Not on file  Stress: Not on file  Social Connections: Not on file  Intimate Partner Violence: Not At Risk (04/24/2022)   Humiliation, Afraid, Rape, and Kick questionnaire    Fear of Current or Ex-Partner: No    Emotionally Abused: No    Physically  Abused: No    Sexually Abused: No    Review of Systems   General: Negative for anorexia, unintentional weight loss, fever, chills, fatigue, weakness. ENT: Negative for hoarseness, difficulty swallowing , nasal congestion. CV: Negative for chest pain, angina, palpitations, positive dyspnea on exertion, peripheral edema.  Respiratory: Negative for dyspnea at rest, positive dyspnea on exertion, cough, sputum, positive wheezing.  GI: See history of present illness. GU:  Negative for dysuria, hematuria, urinary incontinence, urinary frequency, nocturnal urination.  Endo: Negative for unusual weight change.     Physical Exam   BP (!) 120/53 (BP Location: Right Arm, Patient Position: Sitting, Cuff Size: Normal)   Pulse 76   Temp 98.9 F (37.2 C) (Oral)   Ht 5\' 2"  (1.575 m)   Wt 153 lb 9.6 oz (69.7 kg)   SpO2 91%   BMI 28.09 kg/m    General: Somewhat chronically ill-appearing female in no acute distress.  Eyes: No icterus. Mouth: Oropharyngeal mucosa moist and pink  Lungs: Bilateral expiratory wheezes Heart: Regular rate and rhythm, no murmurs rubs or gallops.  Abdomen: Bowel sounds are normal, nontender, nondistended, no hepatosplenomegaly or masses,  no abdominal bruits or hernia , no rebound or guarding.  Rectal: Not performed Extremities: No lower extremity edema. No clubbing or deformities. Neuro: Alert and oriented x 4   Skin: Warm and dry, no jaundice.   Psych: Alert and cooperative, normal mood and affect.  Labs   See HPI Imaging Studies   No results found.  Assessment/Plan:   Constipation: -Continue MiraLAX 1-2 capfuls daily -Increase dietary fiber  Rectal bleeding: -Last colonoscopy May 2022 as outlined above. -PET scan 2024 ordered by her PCP to follow up on pulmonary nodule showed nonspecific anal hypermetabolism without discrete CT mass correlate, most commonly physiologic, suggest correlation with directed clinical history and exam. -Update colonoscopy at  this time.  ASA 3.  I have discussed the risks, alternatives, benefits with regards to but not limited to the risk of reaction to medication, bleeding, infection, perforation and the patient is agreeable to proceed. Written consent to be obtained.   Leanna Battles. Melvyn Neth, MHS, PA-C [facility]

## 2023-07-05 ENCOUNTER — Encounter: Payer: Self-pay | Admitting: *Deleted

## 2023-07-05 ENCOUNTER — Telehealth: Payer: Self-pay | Admitting: *Deleted

## 2023-07-05 ENCOUNTER — Other Ambulatory Visit: Payer: Self-pay | Admitting: *Deleted

## 2023-07-05 MED ORDER — PEG 3350-KCL-NA BICARB-NACL 420 G PO SOLR: 4000.0000 mL | Freq: Once | ORAL | 0 refills | Status: AC

## 2023-07-05 NOTE — Telephone Encounter (Signed)
Pt informed of providers message and recommendations. Verbalized understanding

## 2023-07-05 NOTE — Telephone Encounter (Signed)
 Please let patient know that her hemorrhoids will be assess at time of colonoscopy. If they are internal, she may be a candidate for in office banding. If they are external, that is more of a surgical fix and we could refer her to surgery if they are very symptomatic but if not painful or frequently bleeding, I would advise do nothing.   We do not do any type of hemorrhoid treatment during colonoscopies.

## 2023-07-05 NOTE — Telephone Encounter (Signed)
 Pt advised of providers message and recommendations. She states that she has one hemorrhoid that comes out when she uses the bathroom and has to push it back in. She says it's very irritating and does bleed. She would like for this to be taken care of.

## 2023-07-05 NOTE — Telephone Encounter (Signed)
 Pt has been scheduled for 07/31/23. Instructions mailed and prep sent to the pharmacy.  Pt said she forgot to mention that she had a hemorrhoid that stays out. She wants to know if that can be taken care of. Please advise. Thank you

## 2023-07-05 NOTE — Telephone Encounter (Signed)
 Will assess at time of colonoscopy. Can decide if banding candidate or if needs surgical referral. There is not anything that our providers offer during colonoscopy to treat the hemorrhoids.

## 2023-07-05 NOTE — Telephone Encounter (Signed)
 Attempted to call pt to schedule procedure. Unable to leave vm due to mailbox being full  TCS w/Dr.Carver,asa 3, trilyte

## 2023-07-06 DIAGNOSIS — I739 Peripheral vascular disease, unspecified: Secondary | ICD-10-CM | POA: Diagnosis not present

## 2023-07-06 DIAGNOSIS — B351 Tinea unguium: Secondary | ICD-10-CM | POA: Diagnosis not present

## 2023-07-06 DIAGNOSIS — M79672 Pain in left foot: Secondary | ICD-10-CM | POA: Diagnosis not present

## 2023-07-06 DIAGNOSIS — M79671 Pain in right foot: Secondary | ICD-10-CM | POA: Diagnosis not present

## 2023-07-11 ENCOUNTER — Other Ambulatory Visit (HOSPITAL_COMMUNITY): Payer: Self-pay | Admitting: Podiatry

## 2023-07-11 DIAGNOSIS — M79671 Pain in right foot: Secondary | ICD-10-CM

## 2023-07-11 DIAGNOSIS — R0989 Other specified symptoms and signs involving the circulatory and respiratory systems: Secondary | ICD-10-CM

## 2023-07-18 ENCOUNTER — Ambulatory Visit (HOSPITAL_COMMUNITY)
Admission: RE | Admit: 2023-07-18 | Discharge: 2023-07-18 | Disposition: A | Discharge status: Home / Self Care | Source: Ambulatory Visit | Attending: Podiatry | Admitting: Podiatry

## 2023-07-18 DIAGNOSIS — R0989 Other specified symptoms and signs involving the circulatory and respiratory systems: Secondary | ICD-10-CM | POA: Diagnosis not present

## 2023-07-18 DIAGNOSIS — M79671 Pain in right foot: Secondary | ICD-10-CM | POA: Insufficient documentation

## 2023-07-18 DIAGNOSIS — M79672 Pain in left foot: Secondary | ICD-10-CM | POA: Diagnosis not present

## 2023-07-18 DIAGNOSIS — I739 Peripheral vascular disease, unspecified: Secondary | ICD-10-CM | POA: Diagnosis not present

## 2023-07-21 NOTE — Patient Instructions (Signed)
 Lori Lopez  07/21/2023     @PREFPERIOPPHARMACY @   Your procedure is scheduled on  07/31/2023.   Report to [name] at  0745  A.M.   Call this number if you have problems the morning of surgery:  [phone]  If you experience any cold or flu symptoms such as cough, fever, chills, shortness of breath, etc. between now and your scheduled surgery, please notify us at the above number.   Remember:  Follow the diet and prep instructions given to you by the office.        Use your nebulizer and your inhaler before you come and bring your rescue inhaler with you.   You may drink clear liquids until 0545 am on 07/31/2023.    Clear liquids allowed are:                    Water, Juice (No red color; non-citric and without pulp; diabetics please choose diet or no sugar options), Carbonated beverages (diabetics please choose diet or no sugar options), Clear Tea (No creamer, milk, or cream, including half & half and powdered creamer), Black Coffee Only (No creamer, milk or cream, including half & half and powdered creamer), and Clear Sports drink (No red color; diabetics please choose diet or no sugar options)    Take these medicines the morning of surgery with A SIP OF WATER                     alprazolam (if needed), gabapentin, pantoprazole.     Do not wear jewelry, make-up or nail polish, including gel polish,  artificial nails, or any other type of covering on natural nails (fingers and  toes).  Do not wear lotions, powders, or perfumes, or deodorant.  Do not shave 48 hours prior to surgery.  Men may shave face and neck.  Do not bring valuables to the hospital.  [facility] is not responsible for any belongings or valuables.  Contacts, dentures or bridgework may not be worn into surgery.  Leave your suitcase in the car.  After surgery it may be brought to your room.  For patients admitted to the hospital, discharge time will be determined by your treatment  team.  Patients discharged the day of surgery will not be allowed to drive home and must have someone with them for 24 hours.    Special instructions:   DO NOT smoke tobacco or vape for 24 hours before your procedure.  Please read over the following fact sheets that you were given. Anesthesia Post-op Instructions and Care and Recovery After Surgery      Colonoscopy, Adult, Care After The following information offers guidance on how to care for yourself after your procedure. Your health care provider may also give you more specific instructions. If you have problems or questions, contact your health care provider. What can I expect after the procedure? After the procedure, it is common to have: A small amount of blood in your stool for 24 hours after the procedure. Some gas. Mild cramping or bloating of your abdomen. Follow these instructions at home: Eating and drinking  Drink enough fluid to keep your urine pale yellow. Follow instructions from your health care provider about eating or drinking restrictions. Resume your normal diet as told by your health care provider. Avoid heavy or fried foods that are hard to digest. Activity Rest as told by your health care provider. Avoid sitting for  a long time without moving. Get up to take short walks every 1-2 hours. This is important to improve blood flow and breathing. Ask for help if you feel weak or unsteady. Return to your normal activities as told by your health care provider. Ask your health care provider what activities are safe for you. Managing cramping and bloating  Try walking around when you have cramps or feel bloated. If directed, apply heat to your abdomen as told by your health care provider. Use the heat source that your health care provider recommends, such as a moist heat pack or a heating pad. Place a towel between your skin and the heat source. Leave the heat on for 20-30 minutes. Remove the heat if your skin turns  bright red. This is especially important if you are unable to feel pain, heat, or cold. You have a greater risk of getting burned. General instructions If you were given a sedative during the procedure, it can affect you for several hours. Do not drive or operate machinery until your health care provider says that it is safe. For the first 24 hours after the procedure: Do not sign important documents. Do not drink alcohol. Do your regular daily activities at a slower pace than normal. Eat soft foods that are easy to digest. Take over-the-counter and prescription medicines only as told by your health care provider. Keep all follow-up visits. This is important. Contact a health care provider if: You have blood in your stool 2-3 days after the procedure. Get help right away if: You have more than a small spotting of blood in your stool. You have large blood clots in your stool. You have swelling of your abdomen. You have nausea or vomiting. You have a fever. You have increasing pain in your abdomen that is not relieved with medicine. These symptoms may be an emergency. Get help right away. Call 911. Do not wait to see if the symptoms will go away. Do not drive yourself to the hospital. Summary After the procedure, it is common to have a small amount of blood in your stool. You may also have mild cramping and bloating of your abdomen. If you were given a sedative during the procedure, it can affect you for several hours. Do not drive or operate machinery until your health care provider says that it is safe. Get help right away if you have a lot of blood in your stool, nausea or vomiting, a fever, or increased pain in your abdomen. This information is not intended to replace advice given to you by your health care provider. Make sure you discuss any questions you have with your health care provider. Document Revised: 06/07/2022 Document Reviewed: 12/16/2020 Elsevier Patient Education  2024  [employer]General Anesthesia, Adult, Care After The following information offers guidance on how to care for yourself after your procedure. Your health care provider may also give you more specific instructions. If you have problems or questions, contact your health care provider. What can I expect after the procedure? After the procedure, it is common for people to: Have pain or discomfort at the IV site. Have nausea or vomiting. Have a sore throat or hoarseness. Have trouble concentrating. Feel cold or chills. Feel weak, sleepy, or tired (fatigue). Have soreness and body aches. These can affect parts of the body that were not involved in surgery. Follow these instructions at home: For the time period you were told by your health care provider:  Rest. Do not participate in activities  where you could fall or become injured. Do not drive or use machinery. Do not drink alcohol. Do not take sleeping pills or medicines that cause drowsiness. Do not make important decisions or sign legal documents. Do not take care of children on your own. General instructions Drink enough fluid to keep your urine pale yellow. If you have sleep apnea, surgery and certain medicines can increase your risk for breathing problems. Follow instructions from your health care provider about wearing your sleep device: Anytime you are sleeping, including during daytime naps. While taking prescription pain medicines, sleeping medicines, or medicines that make you drowsy. Return to your normal activities as told by your health care provider. Ask your health care provider what activities are safe for you. Take over-the-counter and prescription medicines only as told by your health care provider. Do not use any products that contain nicotine or tobacco. These products include cigarettes, chewing tobacco, and vaping devices, such as e-cigarettes. These can delay incision healing after surgery. If you need help quitting,  ask your health care provider. Contact a health care provider if: You have nausea or vomiting that does not get better with medicine. You vomit every time you eat or drink. You have pain that does not get better with medicine. You cannot urinate or have bloody urine. You develop a skin rash. You have a fever. Get help right away if: You have trouble breathing. You have chest pain. You vomit blood. These symptoms may be an emergency. Get help right away. Call 911. Do not wait to see if the symptoms will go away. Do not drive yourself to the hospital. Summary After the procedure, it is common to have a sore throat, hoarseness, nausea, vomiting, or to feel weak, sleepy, or fatigue. For the time period you were told by your health care provider, do not drive or use machinery. Get help right away if you have difficulty breathing, have chest pain, or vomit blood. These symptoms may be an emergency. This information is not intended to replace advice given to you by your health care provider. Make sure you discuss any questions you have with your health care provider. Document Revised: 07/23/2021 Document Reviewed: 07/23/2021 Elsevier Patient Education  2024 [employer].

## 2023-07-24 ENCOUNTER — Encounter (HOSPITAL_COMMUNITY)
Admission: RE | Admit: 2023-07-24 | Discharge: 2023-07-24 | Disposition: A | Discharge status: Home / Self Care | Payer: PPO | Source: Ambulatory Visit | Attending: Internal Medicine | Admitting: Internal Medicine

## 2023-07-24 ENCOUNTER — Encounter (HOSPITAL_COMMUNITY): Payer: Self-pay

## 2023-07-24 ENCOUNTER — Telehealth: Payer: Self-pay | Admitting: *Deleted

## 2023-07-24 DIAGNOSIS — F17210 Nicotine dependence, cigarettes, uncomplicated: Secondary | ICD-10-CM

## 2023-07-24 DIAGNOSIS — D72829 Elevated white blood cell count, unspecified: Secondary | ICD-10-CM

## 2023-07-24 DIAGNOSIS — Z720 Tobacco use: Secondary | ICD-10-CM

## 2023-07-24 DIAGNOSIS — R739 Hyperglycemia, unspecified: Secondary | ICD-10-CM

## 2023-07-24 DIAGNOSIS — E876 Hypokalemia: Secondary | ICD-10-CM

## 2023-07-24 NOTE — Telephone Encounter (Signed)
 Contacted pt and advised her that she missed her pre-op appointment. She said she completely forgot. Message sent to see if pt will be able to do pre-op on another day.

## 2023-07-24 NOTE — Pre-Procedure Instructions (Signed)
Office messaged because patient did not show for her pre-op.

## 2023-07-24 NOTE — Telephone Encounter (Signed)
 No Show No Answer Received: Today Jethro Bolus, RN  Elinor Dodge, LPN; Nobie Putnam Hey Dariann Huckaba! This patient did not show up for her PAT.  We attempted to call but no answer.

## 2023-07-25 NOTE — Telephone Encounter (Signed)
 Pt has been informed of new pre-op appointment date and time. Verbalized understanding.   RE: No Show No Answer Received: Today Lindajo Royal, RN  Elinor Dodge, LPN; Jethro Bolus, RN; Guy Begin, RN 3/20 at [phone]

## 2023-07-27 ENCOUNTER — Other Ambulatory Visit (HOSPITAL_COMMUNITY)
Admission: RE | Admit: 2023-07-27 | Discharge: 2023-07-27 | Disposition: A | Discharge status: Home / Self Care | Source: Ambulatory Visit | Attending: Internal Medicine | Admitting: Internal Medicine

## 2023-07-27 ENCOUNTER — Inpatient Hospital Stay (HOSPITAL_COMMUNITY): Admission: RE | Admit: 2023-07-27 | Source: Ambulatory Visit

## 2023-07-27 DIAGNOSIS — E876 Hypokalemia: Secondary | ICD-10-CM | POA: Diagnosis not present

## 2023-07-27 LAB — BASIC METABOLIC PANEL
Anion gap: 8 (ref 5–15)
BUN: 14 mg/dL (ref 8–23)
CO2: 28 mmol/L (ref 22–32)
Calcium: 9 mg/dL (ref 8.9–10.3)
Chloride: 101 mmol/L (ref 98–111)
Creatinine, Ser: 0.98 mg/dL (ref 0.44–1.00)
GFR, Estimated: >60 mL/min (ref >60)
Glucose, Bld: 81 mg/dL (ref 70–99)
Potassium: 4.2 mmol/L (ref 3.5–5.1)
Sodium: 137 mmol/L (ref 135–145)

## 2023-07-27 LAB — CBC WITH DIFFERENTIAL/PLATELET
Abs Immature Granulocytes: 0.02 10*3/uL (ref 0.00–0.07)
Basophils Absolute: 0 10*3/uL (ref 0.0–0.1)
Basophils Relative: 1 %
Eosinophils Absolute: 0.1 10*3/uL (ref 0.0–0.5)
Eosinophils Relative: 1 %
HCT: 39.7 % (ref 36.0–46.0)
Hemoglobin: 13.2 g/dL (ref 12.0–15.0)
Immature Granulocytes: 0 %
Lymphocytes Relative: 25 %
Lymphs Abs: 2.1 10*3/uL (ref 0.7–4.0)
MCH: 30.8 pg (ref 26.0–34.0)
MCHC: 33.2 g/dL (ref 30.0–36.0)
MCV: 92.5 fL (ref 80.0–100.0)
Monocytes Absolute: 0.5 10*3/uL (ref 0.1–1.0)
Monocytes Relative: 6 %
Neutro Abs: 5.9 10*3/uL (ref 1.7–7.7)
Neutrophils Relative %: 67 %
Platelets: 317 10*3/uL (ref 150–400)
RBC: 4.29 MIL/uL (ref 3.87–5.11)
RDW: 13.2 % (ref 11.5–15.5)
WBC: 8.7 10*3/uL (ref 4.0–10.5)
nRBC: 0 % (ref 0.0–0.2)

## 2023-07-28 ENCOUNTER — Telehealth: Payer: Self-pay | Admitting: *Deleted

## 2023-07-28 NOTE — Telephone Encounter (Signed)
 Received message from [facility] in endo "Lori Lopez was a No show for PAT yesterday."  Tried calling patient no answer and VM full

## 2023-07-30 NOTE — Progress Notes (Deleted)
 Lori Lopez, female    DOB: [birth year]    MRN: [id number]   Brief patient profile:  68  yowf active smoker  referred to pulmonary clinic in [place]  02/01/2022 by Dr Margo Aye for copd eval   Admit date: 01/17/2022 Discharge date: 01/19/2022      Brief Hospitalization Summary: Please see all hospital notes, images, labs for full details of the hospitalization.  ADMISSION HPI: 73 y.o. female with medical history significant of COPD, GERD, tobacco abuse who presents to the emergency department via EMS due to 4-day onset of shortness of breath, wheezing, chest congestion and nonproductive cough, she went to an urgent care on Friday (9/8) but still continued to wheeze and have chest congestion.  She used home nebulizer with only minimal relief.  She continues to smoke cigarettes.  She denies fever, chills, chest pain, headache, nausea, vomiting.  Patient was never intubated due to COPD exacerbation.   ED Course:  In the emergency department, she was tachycardic, and was hypoxic on arrival (84% on room air), she was placed on supplemental oxygen with improvement in O2 sats to 96% on supplemental oxygen via [place] at 3 LPM, other vital signs were within normal range.  Work-up in the ED showed leukocytosis, H/H-15.6/47.7, D-dimer < 0.27, BMP was normal except for glucose of 147.  SARS coronavirus 2 was negative.  Chest x-ray showed no active disease.  Breathing treatment was provided, Decadron 10 mg x 1 was given.  Hospitalist was asked to admit patient for further evaluation and management.   HOSPITAL COURSE BY PROBLEM    Acute respiratory failure with hypoxia due to acute exacerbation of COPD Pt responded well to scheduled duo nebs, Mucinex, Solu-Medrol, azithromycin. Continue Protonix to prevent steroid-induced ulcer Continue incentive spirometry and flutter valve Continue supplemental oxygen to maintain O2 sat > 94% with plan to wean patient off oxygen as tolerated (patient does not use oxygen at  baseline) Pt feeling better, DC home today, follow up with PCP in 1 week Pt will discharge on prednisone taper, nebulizers, doxycycline oral tabs  Pt will discharge on home oxygen arranged prior to discharge 2L/min     History of Present Illness  02/01/2022  Pulmonary/ 1st office eval/ Sherene Sires / [place] Office  Chief Complaint  Patient presents with   Consult    Consult for abnormal ct scan in June 2023  Has been on oxygen for about 2-3 weeks prn during day and mainly at night   Dyspnea:   very active prior to admit 100 ft to MB with a hill fine but not any more doing since d/c.  Says did use neb prior to admit but no maint resp rx or 02 Cough: white in am  Sleep: bed blocks/ 1 pillow  SABA use: hfa and neb  02 2lpm hs  Rec Plan A = Automatic = Always=    Breztri Take 2 puffs first thing in am and then another 2 puffs about 12 hours later.  Work on inhaler technique:    Plan B = Backup (to supplement plan A, not to replace it) Only use your albuterol inhaler as a rescue medication  Plan C = Crisis (instead of Plan B but only if Plan B stops working) - only use your albuterol nebulizer if you first try Plan B Ok to try albuterol 15 min before an activity (on alternating days)  that you know would usually make you short of breath   02 2lpm at bedtime plus Make sure  you check your oxygen saturation  AT  your highest level of activity (not after you stop)   to be sure it stays over 90%  Suggested e-cigs as an optional  "one way bridge"  Off all tobacco products      06/06/2022  f/u ov/[place] office/Althea Backs re: GOLD 3 COPD  maint on breztri  Chief Complaint  Patient presents with   Follow-up    Breathing better since last ov  Has questions about advair inhaler   Dyspnea:  slow walking / not doing as much ex due to husband's dementia Cough: min white mostly in ams' - questions whether advair discus would be easier on mouth vs breztri full dose ( has been rinsing and gargling already)   Sleeping: bed blocks no resp SABA use: very little  02: 2lpm hs  Covid status: never vax / never infected  Lung cancer screening: Dr Scharlene Gloss office q June  Rec Try breztri Take 1 puff  first thing in am and then another 2 puffs about 12 hours later.  Work on inhaler technique:  If mouth still a problem > consider change to Stiolto (would need a visit to teach how to use though)   Use vapes in place of cigarettes and uncouple to smoking from the coffee      10/31/2022  6 m f/u ov/[place] office/Joslyne Marshburn re: GOLD 3 copd maint on breztri  2 bid / smoking and vaping  Chief Complaint  Patient presents with   Follow-up    Breathing is about the same. She has occ sharp CP that comes and goes over the past few days.   Dyspnea:  slow walking limited by L hip pain  Cough: worse since 2 weeks . >  white mucus Sleeping: bed blocks no resp cc but wakes up rattling/ congested SABA use: none  02:  2lpm hs / not using daytime Lung cancer screening:  due 11/02/22  Midline cp with cough  Rec For cough/ congestion >   mucinex dm  up to maximum of  1200 mg every 12 hours as needed   Prednisone 10 mg take  4 each am x 2 days,   2 each am x 2 days,  1 each am x 2 days and stop  Work on inhaler technique:  The key is to stop smoking completely before smoking completely stops you!    01/31/2023  3 m f/u ov/[place] office/Callaway Hardigree re: GOLD 3 copd/ 02 at hs and prn maint on breztri   Chief Complaint  Patient presents with   Follow-up    3 month follow up    Dyspnea:  limited by L hip pain more than breathing  Cough: am's x 30 min > white mucus  Sleeping: bed blocks s    resp cc  SABA use: neither one  02: 2lpm HS  Rec Work on optimizing your  inhaler technique:   >>>  Remember how golfers warm up by taking practice swings - do this with an empty inhaler  Suggested e-cigs as an optional  "one way bridge"  Off all tobacco products     08/02/2023  f/u ov/[place] office/Bree Heinzelman re: *** maint on ***   No chief complaint on file.   Dyspnea:  *** Cough: *** Sleeping: ***   resp cc  SABA use: *** 02: ***  Lung cancer screening: ***   No obvious day to day or daytime variability or assoc excess/ purulent sputum or mucus plugs or hemoptysis or cp or chest tightness,  subjective wheeze or overt sinus or hb symptoms.    Also denies any obvious fluctuation of symptoms with weather or environmental changes or other aggravating or alleviating factors except as outlined above   No unusual exposure hx or h/o childhood pna/ asthma or knowledge of premature birth.  Current Allergies, Complete Past Medical History, Past Surgical History, Family History, and Social History were reviewed in [employer] record.  ROS  The following are not active complaints unless bolded Hoarseness, sore throat, dysphagia, dental problems, itching, sneezing,  nasal congestion or discharge of excess mucus or purulent secretions, ear ache,   fever, chills, sweats, unintended wt loss or wt gain, classically pleuritic or exertional cp,  orthopnea pnd or arm/hand swelling  or leg swelling, presyncope, palpitations, abdominal pain, anorexia, nausea, vomiting, diarrhea  or change in bowel habits or change in bladder habits, change in stools or change in urine, dysuria, hematuria,  rash, arthralgias, visual complaints, headache, numbness, weakness or ataxia or problems with walking or coordination,  change in mood or  memory.        No outpatient medications have been marked as taking for the 08/02/23 encounter (Appointment) with Nyoka Cowden, MD.                     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

## 2023-07-31 ENCOUNTER — Encounter (HOSPITAL_COMMUNITY): Payer: Self-pay | Admitting: Internal Medicine

## 2023-07-31 ENCOUNTER — Encounter (HOSPITAL_COMMUNITY): Admission: RE | Payer: Self-pay | Source: Home / Self Care

## 2023-07-31 ENCOUNTER — Ambulatory Visit (HOSPITAL_COMMUNITY)
Admission: RE | Admit: 2023-07-31 | Discharge: 2023-07-31 | Disposition: A | Discharge status: Home / Self Care | Attending: Internal Medicine | Admitting: Internal Medicine

## 2023-07-31 ENCOUNTER — Ambulatory Visit (HOSPITAL_COMMUNITY): Admitting: Certified Registered"

## 2023-07-31 ENCOUNTER — Ambulatory Visit (HOSPITAL_BASED_OUTPATIENT_CLINIC_OR_DEPARTMENT_OTHER): Admitting: Certified Registered"

## 2023-07-31 ENCOUNTER — Telehealth: Payer: Self-pay | Admitting: Internal Medicine

## 2023-07-31 ENCOUNTER — Encounter (HOSPITAL_COMMUNITY)
Admission: RE | Disposition: A | Discharge status: Home / Self Care | Payer: Self-pay | Source: Home / Self Care | Attending: Internal Medicine

## 2023-07-31 ENCOUNTER — Ambulatory Visit (HOSPITAL_COMMUNITY): Admission: RE | Admit: 2023-07-31 | Payer: PPO | Source: Home / Self Care | Admitting: Internal Medicine

## 2023-07-31 DIAGNOSIS — E669 Obesity, unspecified: Secondary | ICD-10-CM | POA: Insufficient documentation

## 2023-07-31 DIAGNOSIS — K5520 Angiodysplasia of colon without hemorrhage: Secondary | ICD-10-CM

## 2023-07-31 DIAGNOSIS — F17200 Nicotine dependence, unspecified, uncomplicated: Secondary | ICD-10-CM | POA: Diagnosis not present

## 2023-07-31 DIAGNOSIS — K625 Hemorrhage of anus and rectum: Secondary | ICD-10-CM | POA: Diagnosis not present

## 2023-07-31 DIAGNOSIS — J449 Chronic obstructive pulmonary disease, unspecified: Secondary | ICD-10-CM | POA: Insufficient documentation

## 2023-07-31 DIAGNOSIS — K649 Unspecified hemorrhoids: Secondary | ICD-10-CM

## 2023-07-31 DIAGNOSIS — I10 Essential (primary) hypertension: Secondary | ICD-10-CM | POA: Insufficient documentation

## 2023-07-31 DIAGNOSIS — K5730 Diverticulosis of large intestine without perforation or abscess without bleeding: Secondary | ICD-10-CM | POA: Insufficient documentation

## 2023-07-31 DIAGNOSIS — F17210 Nicotine dependence, cigarettes, uncomplicated: Secondary | ICD-10-CM | POA: Insufficient documentation

## 2023-07-31 DIAGNOSIS — K635 Polyp of colon: Secondary | ICD-10-CM

## 2023-07-31 DIAGNOSIS — Q2733 Arteriovenous malformation of digestive system vessel: Secondary | ICD-10-CM

## 2023-07-31 DIAGNOSIS — K5521 Angiodysplasia of colon with hemorrhage: Secondary | ICD-10-CM | POA: Insufficient documentation

## 2023-07-31 DIAGNOSIS — Z720 Tobacco use: Secondary | ICD-10-CM

## 2023-07-31 DIAGNOSIS — D72829 Elevated white blood cell count, unspecified: Secondary | ICD-10-CM

## 2023-07-31 DIAGNOSIS — K6289 Other specified diseases of anus and rectum: Secondary | ICD-10-CM

## 2023-07-31 DIAGNOSIS — K648 Other hemorrhoids: Secondary | ICD-10-CM | POA: Diagnosis not present

## 2023-07-31 DIAGNOSIS — E876 Hypokalemia: Secondary | ICD-10-CM

## 2023-07-31 DIAGNOSIS — D122 Benign neoplasm of ascending colon: Secondary | ICD-10-CM | POA: Insufficient documentation

## 2023-07-31 DIAGNOSIS — R739 Hyperglycemia, unspecified: Secondary | ICD-10-CM

## 2023-07-31 DIAGNOSIS — K219 Gastro-esophageal reflux disease without esophagitis: Secondary | ICD-10-CM | POA: Diagnosis not present

## 2023-07-31 HISTORY — PX: COLONOSCOPY: SHX5424

## 2023-07-31 HISTORY — PX: POLYPECTOMY: SHX5525

## 2023-07-31 SURGERY — COLONOSCOPY
Anesthesia: General

## 2023-07-31 SURGERY — COLONOSCOPY WITH PROPOFOL
Anesthesia: Choice

## 2023-07-31 MED ORDER — LIDOCAINE HCL (PF) 2 % IJ SOLN
INTRAMUSCULAR | Status: DC | PRN
  Administered 2023-07-31: 50 mg via INTRADERMAL

## 2023-07-31 MED ORDER — PROPOFOL 10 MG/ML IV BOLUS
INTRAVENOUS | Status: DC | PRN
  Administered 2023-07-31: 100 mg via INTRAVENOUS

## 2023-07-31 MED ORDER — PROPOFOL 500 MG/50ML IV EMUL
INTRAVENOUS | Status: DC | PRN
  Administered 2023-07-31: 150 ug/kg/min via INTRAVENOUS

## 2023-07-31 MED ORDER — LACTATED RINGERS IV SOLN: INTRAVENOUS | Status: DC | PRN

## 2023-07-31 NOTE — Op Note (Signed)
 [facility] Patient Name: Lori Lopez Procedure Date: 07/31/2023 10:56 AM MRN: [id number] Date of Birth: August 22, 1950 Attending MD: Hennie Duos. Marletta Lor , [place], [id number] CSN: [id number] Age: 73 Admit Type: Outpatient Procedure:                Colonoscopy Indications:              Rectal bleeding, Abnormal PET scan of the GI tract Providers:                Hennie Duos. Marletta Lor, DO, Crystal Page, Dyann Ruddle Referring MD:             Hennie Duos. Marletta Lor, DO Medicines:                See the Anesthesia note for documentation of the                            administered medications Complications:            No immediate complications. Estimated Blood Loss:     Estimated blood loss was minimal. Procedure:                Pre-Anesthesia Assessment:                           - The anesthesia plan was to use monitored                            anesthesia care (MAC).                           After obtaining informed consent, the colonoscope                            was passed under direct vision. Throughout the                            procedure, the patient's blood pressure, pulse, and                            oxygen saturations were monitored continuously. The                            [id number]-HQ190L ([id number]) scope was introduced through                            the anus and advanced to the the cecum, identified                            by appendiceal orifice and ileocecal valve. The                            colonoscopy was performed without difficulty. The                            patient tolerated the procedure well. The quality  of the bowel preparation was evaluated using the                            BBPS [facility] Bowel Preparation Scale) with scores                            of: Right Colon = 3, Transverse Colon = 3 and Left                            Colon = 3 (entire mucosa seen well with no residual                            staining, small  fragments of stool or opaque                            liquid). The total BBPS score equals 9. Scope In: 11:08:54 AM Scope Out: 11:19:41 AM Scope Withdrawal Time: 0 hours 8 minutes 45 seconds  Total Procedure Duration: 0 hours 10 minutes 47 seconds  Findings:      Hypertrophied anal papillae found on retroflection in rectum      Non-bleeding internal hemorrhoids were found during retroflexion.      Multiple small-mouthed diverticula were found in the sigmoid colon.      A 6 mm polyp was found in the ascending colon. The polyp was sessile.       The polyp was removed with a cold snare. Resection and retrieval were       complete.      A single medium-sized angiodysplastic lesion without bleeding was found       in the cecum. Not treated today as patient does not have evidence of       anemia Impression:               - Non-bleeding internal hemorrhoids.                           - Diverticulosis in the sigmoid colon.                           - One 6 mm polyp in the ascending colon, removed                            with a cold snare. Resected and retrieved.                           - A single non-bleeding colonic angiodysplastic                            lesion. Moderate Sedation:      Per Anesthesia Care Recommendation:           - Patient has a contact number available for                            emergencies. The signs and symptoms of potential  delayed complications were discussed with the                            patient. Return to normal activities tomorrow.                            Written discharge instructions were provided to the                            patient.                           - Resume previous diet.                           - Continue present medications.                           - Await pathology results.                           - Repeat colonoscopy in 7 years for surveillance.                           - Return to GI  clinic in 2 months.                           - Bleeding likely from internal hemorrhoids. Also                            reports feeling of prolapsed tissue with BMs.                            Likely from anal papillae. Consider surgical                            referral if patient wishes to have this treated, Procedure Code(s):        --- Professional ---                           [phone], Colonoscopy, flexible; with removal of                            tumor(s), polyp(s), or other lesion(s) by snare                            technique Diagnosis Code(s):        --- Professional ---                           K64.8, Other hemorrhoids                           D12.2, Benign neoplasm of ascending colon  K55.20, Angiodysplasia of colon without hemorrhage                           K62.5, Hemorrhage of anus and rectum                           K57.30, Diverticulosis of large intestine without                            perforation or abscess without bleeding                           R93.3, Abnormal findings on diagnostic imaging of                            other parts of digestive tract CPT copyright 2022 American Medical Association. All rights reserved. The codes documented in this report are preliminary and upon coder review may  be revised to meet current compliance requirements. Hennie Duos. Marletta Lor, DO Hennie Duos. Marletta Lor, DO 07/31/2023 11:29:28 AM This report has been signed electronically. Number of Addenda: 0

## 2023-07-31 NOTE — Telephone Encounter (Signed)
 Can we please refer this patient to Dr. Lovell Sheehan?  He is already reviewed case with me.  Dx. Hemorrhoids, anal papillae, hernia  Thank you!

## 2023-07-31 NOTE — Discharge Instructions (Addendum)
  Colonoscopy Discharge Instructions  Read the instructions outlined below and refer to this sheet in the next few weeks. These discharge instructions provide you with general information on caring for yourself after you leave the hospital. Your doctor may also give you specific instructions. While your treatment has been planned according to the most current medical practices available, unavoidable complications occasionally occur.   ACTIVITY You may resume your regular activity, but move at a slower pace for the next 24 hours.  Take frequent rest periods for the next 24 hours.  Walking will help get rid of the air and reduce the bloated feeling in your belly (abdomen).  No driving for 24 hours (because of the medicine (anesthesia) used during the test).   Do not sign any important legal documents or operate any machinery for 24 hours (because of the anesthesia used during the test).  NUTRITION Drink plenty of fluids.  You may resume your normal diet as instructed by your doctor.  Begin with a light meal and progress to your normal diet. Heavy or fried foods are harder to digest and may make you feel sick to your stomach (nauseated).  Avoid alcoholic beverages for 24 hours or as instructed.  MEDICATIONS You may resume your normal medications unless your doctor tells you otherwise.  WHAT YOU CAN EXPECT TODAY Some feelings of bloating in the abdomen.  Passage of more gas than usual.  Spotting of blood in your stool or on the toilet paper.  IF YOU HAD POLYPS REMOVED DURING THE COLONOSCOPY: No aspirin products for 7 days or as instructed.  No alcohol for 7 days or as instructed.  Eat a soft diet for the next 24 hours.  FINDING OUT THE RESULTS OF YOUR TEST Not all test results are available during your visit. If your test results are not back during the visit, make an appointment with your caregiver to find out the results. Do not assume everything is normal if you have not heard from your  caregiver or the medical facility. It is important for you to follow up on all of your test results.  SEEK IMMEDIATE MEDICAL ATTENTION IF: You have more than a spotting of blood in your stool.  Your belly is swollen (abdominal distention).  You are nauseated or vomiting.  You have a temperature over 101.  You have abdominal pain or discomfort that is severe or gets worse throughout the day.   Your colonoscopy revealed 1 polyp(s) which I removed successfully. Await pathology results, my office will contact you. I recommend repeating colonoscopy in 7 years for surveillance purposes.   You also have diverticulosis and internal hemorrhoids. I would recommend increasing fiber in your diet or adding OTC Benefiber/Metamucil. Be sure to drink at least 4 to 6 glasses of water daily.   You also have a lesion in your anus called hypertrophied anal papilla.  This is benign.  This is likely what is prolapsing with bowel movements.  I can refer you to general surgeon to have this removed as well as your hemorrhoids fixed if you would like me to.  Follow-up with GI in 2-3 months   I hope you have a great rest of your week!  Hennie Duos. Marletta Lor, D.O. Gastroenterology and Hepatology [facility]

## 2023-07-31 NOTE — Addendum Note (Signed)
 Addended by: Armstead Peaks on: 07/31/2023 11:38 AM   Modules accepted: Orders

## 2023-07-31 NOTE — Interval H&P Note (Signed)
 History and Physical Interval Note:  07/31/2023 10:56 AM  Lori Lopez  has presented today for surgery, with the diagnosis of rectal bleeding,abnormal PET-anus.  The various methods of treatment have been discussed with the patient and family. After consideration of risks, benefits and other options for treatment, the patient has consented to  Procedure(s): COLONOSCOPY (N/A) as a surgical intervention.  The patient's history has been reviewed, patient examined, no change in status, stable for surgery.  I have reviewed the patient's chart and labs.  Questions were answered to the patient's satisfaction.     [name]

## 2023-07-31 NOTE — Anesthesia Procedure Notes (Signed)
 Date/Time: 07/31/2023 11:05 AM  Performed by: Julian Reil, CRNAPre-anesthesia Checklist: Patient identified, Emergency Drugs available, Suction available and Patient being monitored Patient Re-evaluated:Patient Re-evaluated prior to induction Oxygen Delivery Method: Nasal cannula Induction Type: IV induction Placement Confirmation: positive ETCO2

## 2023-07-31 NOTE — Anesthesia Preprocedure Evaluation (Signed)
 Anesthesia Evaluation  Patient identified by MRN, date of birth, ID band Patient awake    Reviewed: Allergy & Precautions, H&P , NPO status , Patient's Chart, lab work & pertinent test results, reviewed documented beta blocker date and time   Airway Mallampati: II  TM Distance: >3 FB Neck ROM: full    Dental no notable dental hx.    Pulmonary neg pulmonary ROS, COPD, Current Smoker and Patient abstained from smoking.   Pulmonary exam normal breath sounds clear to auscultation       Cardiovascular Exercise Tolerance: Good hypertension, negative cardio ROS  Rhythm:regular Rate:Normal     Neuro/Psych  PSYCHIATRIC DISORDERS Anxiety     negative neurological ROS  negative psych ROS   GI/Hepatic negative GI ROS, Neg liver ROS,GERD  ,,  Endo/Other  negative endocrine ROSHypothyroidism    Renal/GU Renal diseasenegative Renal ROS  negative genitourinary   Musculoskeletal   Abdominal   Peds  Hematology negative hematology ROS (+)   Anesthesia Other Findings   Reproductive/Obstetrics negative OB ROS                             Anesthesia Physical Anesthesia Plan  ASA: 3  Anesthesia Plan: General   Post-op Pain Management:    Induction:   PONV Risk Score and Plan: Propofol infusion  Airway Management Planned:   Additional Equipment:   Intra-op Plan:   Post-operative Plan:   Informed Consent: I have reviewed the patients History and Physical, chart, labs and discussed the procedure including the risks, benefits and alternatives for the proposed anesthesia with the patient or authorized representative who has indicated his/her understanding and acceptance.     Dental Advisory Given  Plan Discussed with: CRNA  Anesthesia Plan Comments:        Anesthesia Quick Evaluation

## 2023-07-31 NOTE — Telephone Encounter (Signed)
 Referral placed.

## 2023-07-31 NOTE — Transfer of Care (Signed)
 Immediate Anesthesia Transfer of Care Note  Patient: Lori Lopez  Procedure(s) Performed: COLONOSCOPY POLYPECTOMY  Patient Location: Short Stay  Anesthesia Type:General  Level of Consciousness: awake, alert , and oriented  Airway & Oxygen Therapy: Patient Spontanous Breathing  Post-op Assessment: Report given to RN and Post -op Vital signs reviewed and stable  Post vital signs: Reviewed and stable  Last Vitals:  Vitals Value Taken Time  BP    Temp    Pulse 70 07/31/23 1123  Resp 14 07/31/23 1123  SpO2 100 % 07/31/23 1123  Vitals shown include unfiled device data.  Last Pain:  Vitals:   07/31/23 1105  TempSrc:   PainSc: 0-No pain      Patients Stated Pain Goal: 5 (07/31/23 1016)  Complications: No notable events documented.

## 2023-08-01 ENCOUNTER — Encounter (HOSPITAL_COMMUNITY): Payer: Self-pay | Admitting: Internal Medicine

## 2023-08-01 LAB — SURGICAL PATHOLOGY

## 2023-08-02 ENCOUNTER — Ambulatory Visit: Payer: PPO | Admitting: Internal Medicine

## 2023-08-04 NOTE — Anesthesia Postprocedure Evaluation (Signed)
 Anesthesia Post Note  Patient: Lori Lopez  Procedure(s) Performed: COLONOSCOPY POLYPECTOMY  Patient location during evaluation: Phase II Anesthesia Type: General Level of consciousness: awake Pain management: pain level controlled Vital Signs Assessment: post-procedure vital signs reviewed and stable Respiratory status: spontaneous breathing and respiratory function stable Cardiovascular status: blood pressure returned to baseline and stable Postop Assessment: no headache and no apparent nausea or vomiting Anesthetic complications: no Comments: Late entry   No notable events documented.   Last Vitals:  Vitals:   07/31/23 1016 07/31/23 1123  BP: (!) 147/54 (!) 113/50  Pulse: 68 70  Resp: 18 15  Temp: 36.6 C 36.5 C  SpO2: 97% 100%    Last Pain:  Vitals:   08/01/23 1504  TempSrc:   PainSc: 0-No pain                 Windell Norfolk

## 2023-08-22 DIAGNOSIS — M2540 Effusion, unspecified joint: Secondary | ICD-10-CM | POA: Diagnosis not present

## 2023-08-22 DIAGNOSIS — J449 Chronic obstructive pulmonary disease, unspecified: Secondary | ICD-10-CM | POA: Diagnosis not present

## 2023-08-22 DIAGNOSIS — R7303 Prediabetes: Secondary | ICD-10-CM | POA: Diagnosis not present

## 2023-08-28 ENCOUNTER — Other Ambulatory Visit (HOSPITAL_COMMUNITY): Payer: Self-pay | Admitting: Family Medicine

## 2023-08-28 DIAGNOSIS — Z122 Encounter for screening for malignant neoplasm of respiratory organs: Secondary | ICD-10-CM

## 2023-08-28 DIAGNOSIS — I700 Atherosclerosis of aorta: Secondary | ICD-10-CM | POA: Diagnosis not present

## 2023-08-28 DIAGNOSIS — J449 Chronic obstructive pulmonary disease, unspecified: Secondary | ICD-10-CM | POA: Diagnosis not present

## 2023-08-28 DIAGNOSIS — K5904 Chronic idiopathic constipation: Secondary | ICD-10-CM | POA: Diagnosis not present

## 2023-08-28 DIAGNOSIS — S76011D Strain of muscle, fascia and tendon of right hip, subsequent encounter: Secondary | ICD-10-CM | POA: Diagnosis not present

## 2023-08-28 DIAGNOSIS — J439 Emphysema, unspecified: Secondary | ICD-10-CM | POA: Diagnosis not present

## 2023-08-28 DIAGNOSIS — R7303 Prediabetes: Secondary | ICD-10-CM | POA: Diagnosis not present

## 2023-08-28 DIAGNOSIS — K219 Gastro-esophageal reflux disease without esophagitis: Secondary | ICD-10-CM | POA: Diagnosis not present

## 2023-08-28 DIAGNOSIS — E785 Hyperlipidemia, unspecified: Secondary | ICD-10-CM | POA: Diagnosis not present

## 2023-08-28 DIAGNOSIS — Z0001 Encounter for general adult medical examination with abnormal findings: Secondary | ICD-10-CM | POA: Diagnosis not present

## 2023-08-28 DIAGNOSIS — S76011A Strain of muscle, fascia and tendon of right hip, initial encounter: Secondary | ICD-10-CM | POA: Diagnosis not present

## 2023-08-28 DIAGNOSIS — D751 Secondary polycythemia: Secondary | ICD-10-CM | POA: Diagnosis not present

## 2023-08-28 DIAGNOSIS — F411 Generalized anxiety disorder: Secondary | ICD-10-CM | POA: Diagnosis not present

## 2023-08-29 ENCOUNTER — Ambulatory Visit: Admitting: General Surgery

## 2023-08-30 NOTE — Progress Notes (Unsigned)
 Lori Lopez, female    DOB: Dec 05, 1950    MRN: [id number]   Brief patient profile:  73  yowf active smoker  referred to pulmonary clinic in [place]  02/01/2022 by Dr Del Favia for copd eval   Admit date: 01/17/2022 Discharge date: 01/19/2022      Brief Hospitalization Summary: Please see all hospital notes, images, labs for full details of the hospitalization.  ADMISSION HPI: 73 y.o. female with medical history significant of COPD, GERD, tobacco abuse who presents to the emergency department via EMS due to 4-day onset of shortness of breath, wheezing, chest congestion and nonproductive cough, she went to an urgent care on Friday (9/8) but still continued to wheeze and have chest congestion.  She used home nebulizer with only minimal relief.  She continues to smoke cigarettes.  She denies fever, chills, chest pain, headache, nausea, vomiting.  Patient was never intubated due to COPD exacerbation.   ED Course:  In the emergency department, she was tachycardic, and was hypoxic on arrival (84% on room air), she was placed on supplemental oxygen  with improvement in O2 sats to 96% on supplemental oxygen  via [place] at 3 LPM, other vital signs were within normal range.  Work-up in the ED showed leukocytosis, H/H-15.6/47.7, D-dimer < 0.27, BMP was normal except for glucose of 147.  SARS coronavirus 2 was negative.  Chest x-ray showed no active disease.  Breathing treatment was provided, Decadron  10 mg x 1 was given.  Hospitalist was asked to admit patient for further evaluation and management.   HOSPITAL COURSE BY PROBLEM    Acute respiratory failure with hypoxia due to acute exacerbation of COPD Pt responded well to scheduled duo nebs, Mucinex , Solu-Medrol , azithromycin . Continue Protonix  to prevent steroid-induced ulcer Continue incentive spirometry and flutter valve Continue supplemental oxygen  to maintain O2 sat > 94% with plan to wean patient off oxygen  as tolerated (patient does not use oxygen  at  baseline) Pt feeling better, DC home today, follow up with PCP in 1 week Pt will discharge on prednisone  taper, nebulizers, doxycycline  oral tabs  Pt will discharge on home oxygen  arranged prior to discharge 2L/min     History of Present Illness  02/01/2022  Pulmonary/ 1st office eval/ [facility] / [place] [facility]  Chief Complaint  Patient presents with   Consult    Consult for abnormal ct scan in June 2023  Has been on oxygen  for about 2-3 weeks prn during day and mainly at night   Dyspnea:   very active prior to admit 100 ft to MB with a hill fine but not any more doing since d/c.  Says did use neb prior to admit but no maint resp rx or 02 Cough: white in am  Sleep: bed blocks/ 1 pillow  SABA use: hfa and neb  02 2lpm hs  Rec Plan A = Automatic = Always=    Breztri  Take 2 puffs first thing in am and then another 2 puffs about 12 hours later.  Work on inhaler technique:    Plan B = Backup (to supplement plan A, not to replace it) Only use your albuterol  inhaler as a rescue medication  Plan C = Crisis (instead of Plan B but only if Plan B stops working) - only use your albuterol  nebulizer if you first try Plan B Ok to try albuterol  15 min before an activity (on alternating days)  that you know would usually make you short of breath   02 2lpm at bedtime plus Make sure  you check your oxygen  saturation  AT  your highest level of activity (not after you stop)   to be sure it stays over 90%  Suggested e-cigs as an optional  "one way bridge"  Off all tobacco products      06/06/2022  f/u ov/[place] office/Lori Lopez re: GOLD 3 COPD  maint on breztri   Chief Complaint  Patient presents with   Follow-up    Breathing better since last ov  Has questions about advair inhaler   Dyspnea:  slow walking / not doing as much ex due to husband's dementia Cough: min white mostly in ams' - questions whether advair discus would be easier on mouth vs breztri  full dose ( has been rinsing and gargling already)   Sleeping: bed blocks no resp SABA use: very little  02: 2lpm hs  Covid status: never vax / never infected  Lung cancer screening: Dr Quentin Brunner office q June  Rec Try breztri  Take 1 puff  first thing in am and then another 2 puffs about 12 hours later.  Work on inhaler technique:  If mouth still a problem > consider change to Stiolto (would need a visit to teach how to use though)   Use vapes in place of cigarettes and uncouple to smoking from the coffee      10/31/2022  6 m f/u ov/[place] [facility]/Lori Lopez re: GOLD 3 copd maint on breztri   2 bid / smoking and vaping  Chief Complaint  Patient presents with   Follow-up    Breathing is about the same. She has occ sharp CP that comes and goes over the past few days.   Dyspnea:  slow walking limited by L hip pain  Cough: worse since 2 weeks . >  white mucus Sleeping: bed blocks no resp cc but wakes up rattling/ congested SABA use: none  02:  2lpm hs / not using daytime Lung cancer screening:  due 11/02/22  Midline cp with cough  Rec For cough/ congestion >   mucinex  dm  up to maximum of  1200 mg every 12 hours as needed   Prednisone  10 mg take  4 each am x 2 days,   2 each am x 2 days,  1 each am x 2 days and stop  Work on inhaler technique:  The key is to stop smoking completely before smoking completely stops you!    01/31/2023  3 m f/u ov/[place] office/Lori Lopez re: GOLD 3 copd/ 02 at hs and prn maint on breztri    Chief Complaint  Patient presents with   Follow-up    3 month follow up    Dyspnea:  limited by L hip pain more than breathing  Cough: am's x 30 min > white mucus  Sleeping: bed blocks s    resp cc  SABA use: neither one  02: 2lpm HS  Rec Work on optimizing your  inhaler technique:   >>>  Remember how golfers warm up by taking practice swings - do this with an empty inhaler  Suggested e-cigs as an optional  "one way bridge"  Off all tobacco products     08/31/2023  f/u ov/ office/Lori Lopez re: GOLD 3 copd / 02 hs  maint on bretri/ gabapentin  300 mg bid  /still smoking  Chief Complaint  Patient presents with   COPD  Dyspnea:  limited by L hip > breathing , still doing yard work Cough: p supper and some in am > mostly clear  Sleeping: bed blocks / 2 pillows s  resp cc  SABA use: not needing  02: 2lpm hs only  LDSCT due q June     No obvious day to day or daytime variability or assoc excess/ purulent sputum or mucus plugs or hemoptysis or cp or chest tightness, subjective wheeze or overt sinus or hb symptoms.    Also denies any obvious fluctuation of symptoms with weather or environmental changes or other aggravating or alleviating factors except as outlined above   No unusual exposure hx or h/o childhood pna/ asthma or knowledge of premature birth.  Current Allergies, Complete Past Medical History, Past Surgical History, Family History, and Social History were reviewed in [employer] record.  ROS  The following are not active complaints unless bolded Hoarseness, sore throat, dysphagia, dental problems, itching, sneezing,  nasal congestion or discharge of excess mucus or purulent secretions, ear ache,   fever, chills, sweats, unintended wt loss or wt gain, classically pleuritic or exertional cp,  orthopnea pnd or arm/hand swelling  or leg swelling, presyncope, palpitations, abdominal pain, anorexia, nausea, vomiting, diarrhea  or change in bowel habits or change in bladder habits, change in stools or change in urine, dysuria, hematuria,  rash, arthralgias, visual complaints, headache, numbness, weakness or ataxia or problems with walking or coordination,  change in mood or  memory.        Current Meds  Medication Sig   albuterol  (VENTOLIN  HFA) 108 (90 Base) MCG/ACT inhaler 2 puffs every 4 (four) hours as needed for shortness of breath or wheezing.   ALPRAZolam  (XANAX ) 1 MG tablet Take 0.5 mg by mouth daily as needed for anxiety.   B Complex-Biotin-FA (B-COMPLEX HIGH STRENGTH 10  PO) Take by mouth.   Budeson-Glycopyrrol-Formoterol (BREZTRI  AEROSPHERE IN) Inhale 2 puffs into the lungs in the morning and at bedtime.   Cholecalciferol  (VITAMIN D3) 1000 units CAPS Take 1,000 Units by mouth daily.   fluticasone  (FLONASE) 50 MCG/ACT nasal spray Place 1 spray into both nostrils daily.   gabapentin  (NEURONTIN ) 300 MG capsule Take 1 capsule (300 mg total) by mouth 2 (two) times daily.   ipratropium-albuterol  (DUONEB) 0.5-2.5 (3) MG/3ML SOLN Take 3 mLs by nebulization every 4 (four) hours as needed.   levocetirizine (XYZAL) 5 MG tablet Take 5 mg by mouth at bedtime.   pantoprazole  (PROTONIX ) 40 MG tablet Take 40 mg by mouth daily.                     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   Degeneration, intervertebral disc, cervicothoracic 06/13/2013   Hypothyroidism    Mixed hyperlipidemia 05/15/2013   Obesity due to excess calories 05/15/2013   Tinea unguium 05/15/2013   Vitamin D  deficiency 12/28/2009      Objective:    Wts  08/31/2023        149  01/31/2023        161 10/31/2022        161 06/06/2022        158   03/16/22 158 lb 6.4 oz (71.8 kg)  02/01/22 151 lb 9.6 oz (68.8 kg)  01/18/22 158 lb 15.2 oz (72.1 kg)    Vital signs reviewed  08/31/2023  - Note at rest 02 sats  90% on RA  General appearance:    amb pleasant wf/ smoker's rattle     HEENT : Oropharynx  clear / full set dentures   Nasal turbinates nl    NECK :  without  apparent JVD/ palpable Nodes/TM    LUNGS: no acc muscle use,  Mild barrel  contour chest wall with bilateral  Distant bs s audible wheeze and  without cough on insp or exp maneuvers  and mild  Hyperresonant  to  percussion bilaterally     CV:   RRR  no s3 or murmur or increase in P2, and no edema   ABD:  soft and nontender with pos end  insp Hoover's  in the supine position.  No bruits or organomegaly appreciated   MS:  Nl gait/ ext warm without deformities Or obvious joint restrictions  calf tenderness, cyanosis or clubbing     SKIN: warm and dry without lesions    NEURO:  alert, approp, nl sensorium with  no motor or cerebellar deficits apparent.        Assessment

## 2023-08-31 ENCOUNTER — Encounter: Payer: Self-pay | Admitting: Internal Medicine

## 2023-08-31 ENCOUNTER — Ambulatory Visit: Admitting: Internal Medicine

## 2023-08-31 VITALS — BP 101/58 | HR 75 | Ht 62.0 in | Wt 149.4 lb

## 2023-08-31 DIAGNOSIS — J9611 Chronic respiratory failure with hypoxia: Secondary | ICD-10-CM

## 2023-08-31 DIAGNOSIS — F17210 Nicotine dependence, cigarettes, uncomplicated: Secondary | ICD-10-CM

## 2023-08-31 DIAGNOSIS — J449 Chronic obstructive pulmonary disease, unspecified: Secondary | ICD-10-CM

## 2023-08-31 DIAGNOSIS — Z9981 Dependence on supplemental oxygen: Secondary | ICD-10-CM | POA: Diagnosis not present

## 2023-08-31 DIAGNOSIS — J9612 Chronic respiratory failure with hypercapnia: Secondary | ICD-10-CM | POA: Diagnosis not present

## 2023-08-31 NOTE — Assessment & Plan Note (Addendum)
 Active smoker s/p aecopd> admit 01/17/22 - 02/01/2022  continue breztri   - PFT's  05/24/22  FEV1 1.02 (49 % ) ratio 0.57  p 1 % improvement from saba p Breztri  prior to study with DLCO  10.46 (57%)   and FV curve mildly concave     - 06/06/2022    75% (short Ti) > continue breztri   1-2 bid to reduce effects on oropharynx  - 08/31/2023  After extensive coaching inhaler device,  effectiveness =    75% from a baseline of 50 % (sort Ti)    Group D (now reclassified as E) in terms of symptom/risk and laba/lama/ICS  therefore appropriate rx at this point >>>  breztri  and approp saba  >>> add pepcid  20 mg p supper to see if helps cough and if so continue ppi q am  andh H2 q pm

## 2023-08-31 NOTE — Assessment & Plan Note (Signed)
 Counseled re importance of smoking cessation but did not meet time criteria for separate billing    Low-dose CT lung cancer screening is recommended for patients who are 14-73 years of age with a 20+ pack-year history of smoking and who are currently smoking or quit <=15 years ago. No coughing up blood  No unintentional weight loss of > 15 pounds in the last 6 months - pt is eligible for scanning yearly until age 50 > due q June / reviewed   F/u in this clinic can be q 6 m, sooner if needed          Each maintenance medication was reviewed in detail including emphasizing most importantly the difference between maintenance and prns and under what circumstances the prns are to be triggered using an action plan format where appropriate.  Total time for H and P, chart review, counseling, reviewing hfa/ 02  device(s) and generating customized AVS unique to this office visit / same day charting = 30 min

## 2023-08-31 NOTE — Patient Instructions (Addendum)
 Add pepcid  20 mg after supper for a month to see if helps with cough p supper    Work on inhaler technique:  relax and gently blow all the way out then take a nice smooth full deep breath back in, triggering the inhaler at same time you start breathing in.  Hold breath in for at least  5 seconds if you can. Blow out breztri   thru nose. Rinse and gargle with water  when done.  If mouth or throat bother you at all,  try brushing teeth/gums/tongue with arm and hammer toothpaste/ make a slurry and gargle and spit out.   >>>  Remember how golfers warm up by taking practice swings - do this with an empty inhaler   The key is to stop smoking completely before smoking completely stops you!    Please schedule a follow up visit in 6 months but call sooner if needed

## 2023-08-31 NOTE — Assessment & Plan Note (Signed)
 HC03  01/17/22  = 32  - 02/01/2022   Walked on RA  X 3   lap(s) =  approx 450  ft  @ mod pace, stopped due to end of study  with lowest 02 sats 94% no sob  - HC03  04/25/12  = 28   Adequate control on present rx, reviewed in detail with pt > no change in rx needed

## 2023-09-12 ENCOUNTER — Encounter: Payer: Self-pay | Admitting: Vascular Surgery

## 2023-09-12 ENCOUNTER — Ambulatory Visit: Admitting: Vascular Surgery

## 2023-09-12 VITALS — BP 115/57 | HR 75 | Temp 98.1°F | Resp 18 | Ht 62.0 in | Wt 149.8 lb

## 2023-09-12 DIAGNOSIS — I739 Peripheral vascular disease, unspecified: Secondary | ICD-10-CM

## 2023-09-12 MED ORDER — CILOSTAZOL 100 MG PO TABS: 100.0000 mg | ORAL_TABLET | Freq: Two times a day (BID) | ORAL | 11 refills | Status: AC

## 2023-09-12 NOTE — Progress Notes (Signed)
 Patient name: Lori Lopez MRN: [id number] DOB: 1950/05/24 Sex: female  REASON FOR CONSULT: PVD with abnormal ABIs  HPI: Lori Lopez is a 73 y.o. female, with history of COPD, hyperlipidemia, tobacco abuse who presents for evaluation of PVD with abnormal ABIs.  Patient had ABIs on 07/18/2023 that were 0.86 on the right mildly abnormal and 0.78 on the left moderately abnormal.  There was suggestion of inflow disease based on waveforms in the aortoiliac segment.  States her legs have been bothering her for about 6 months.  Can walk to her trash cans about 200 yards away and halfway back to her house before the legs start cramping.  States both legs bother her.  She does smoke about 4 cigarettes a day.  No prior vascular intervention.  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   Degeneration, intervertebral disc, cervicothoracic 06/13/2013   Hypothyroidism    Mixed hyperlipidemia 05/15/2013   Obesity due to excess calories 05/15/2013   Tinea unguium 05/15/2013   Vitamin D  deficiency 12/28/2009    Past Surgical History:  Procedure Laterality Date   BACK SURGERY     BIOPSY  11/07/2017   Procedure: BIOPSY;  Surgeon: Alyce Jubilee, MD;  [facility];  Service: Endoscopy;;  gastric biopsy    CERVICAL DISC SURGERY     COLONOSCOPY  2008   Dr. Nolene Baumgarten: normal colon, small internal hemorrhoids.    COLONOSCOPY N/A 04/08/2016   Dr. Nolene Baumgarten: examined portion of ileum normal. two 5-6 mm polyps in descending colon and transverse colon, three 2-4 mm polyps in rectum, (all hyperplastic), internal and external hemorrhoids. Repeat in 10 years   COLONOSCOPY N/A 07/31/2023   Procedure: COLONOSCOPY;  Surgeon: Vinetta Greening, DO;  Location: [facility];  Service: Endoscopy;  Laterality: N/A;   COLONOSCOPY WITH PROPOFOL  N/A 09/22/2020   Procedure: COLONOSCOPY WITH PROPOFOL ;  Surgeon: Vinetta Greening, DO;  Location: [facility];  Service: Endoscopy;  Laterality: N/A;  2:45pm   ESOPHAGOGASTRODUODENOSCOPY (EGD) WITH PROPOFOL  N/A 11/07/2017   Dr. Nolene Baumgarten: Normal esophagus status post dilation.  Mild gastritis with benign biopsies   POLYPECTOMY  09/22/2020   Procedure: POLYPECTOMY;  Surgeon: Vinetta Greening, DO;  [facility];  Service: Endoscopy;;   POLYPECTOMY  07/31/2023   Procedure: POLYPECTOMY;  Surgeon: Vinetta Greening, DO;  [facility];  Service: Endoscopy;;   SAVORY DILATION N/A 11/07/2017   Procedure: SAVORY DILATION;  Surgeon: Alyce Jubilee, MD;  [facility];  Service: Endoscopy;  Laterality: N/A;    Family History  Problem Relation Age of Onset   Diabetes Mother    CAD Mother    Heart disease Mother    CAD Father    Colon cancer Neg Hx    Colon polyps Neg Hx     SOCIAL HISTORY: Social History   Socioeconomic History   Marital status: Widowed    Spouse name: Not on file   Number of children: Not on file   Years of education: Not on file   Highest education level: Not on file  Occupational History   Not on file  Tobacco Use   Smoking status: Every Day    Current packs/day: 0.50  Average packs/day: 0.5 packs/day for 60.3 years (30.2 ttl pk-yrs)    Types: Cigarettes    Start date: 1965   Smokeless tobacco: Never   Tobacco comments:    vapes  Vaping Use   Vaping status: Every Day   Substances: Flavoring  Substance and Sexual Activity   Alcohol use: No    Alcohol/week: 0.0 standard drinks of alcohol   Drug use: No   Sexual activity: Yes    Birth control/protection: Surgical  Other Topics Concern   Not on file  Social History Narrative   Not on file   Social Drivers of Health   Financial Resource Strain: Not on file  Food  Insecurity: Low Risk  (07/15/2022)   [facility]   Hunger Vital Sign    Within the past 12 months, you worried that your food would run out before you got money to buy more: Not on file    Ran Out of Food in the Last Year: Never true  Transportation Needs: No Transportation Needs (07/15/2022)   [facility]   Transportation    In the past 12 months, has lack of reliable transportation kept you from medical appointments, meetings, work or from getting things needed for daily living? : No  Physical Activity: Not on file  Stress: Not on file  Social Connections: Not on file  Intimate Partner Violence: Not At Risk (04/24/2022)   Humiliation, Afraid, Rape, and Kick questionnaire    Fear of Current or Ex-Partner: No    Emotionally Abused: No    Physically Abused: No    Sexually Abused: No    Allergies  Allergen Reactions   Shellfish-Derived Products Anaphylaxis   Shrimp [Shellfish Allergy] Anaphylaxis   Hydrocodone Hives and Itching   Lyrica [Pregabalin] Other (See Comments)    unknown    Current Outpatient Medications  Medication Sig Dispense Refill   albuterol  (VENTOLIN  HFA) 108 (90 Base) MCG/ACT inhaler 2 puffs every 4 (four) hours as needed for shortness of breath or wheezing.     ALPRAZolam  (XANAX ) 1 MG tablet Take 0.5 mg by mouth daily as needed for anxiety.     B Complex-Biotin-FA (B-COMPLEX HIGH STRENGTH 10 PO) Take by mouth.     Budeson-Glycopyrrol-Formoterol (BREZTRI  AEROSPHERE IN) Inhale 2 puffs into the lungs in the morning and at bedtime.     Cholecalciferol  (VITAMIN D3) 1000 units CAPS Take 1,000 Units by mouth daily.     fluticasone  (FLONASE) 50 MCG/ACT nasal spray Place 1 spray into both nostrils daily.     gabapentin  (NEURONTIN ) 300 MG capsule Take 1 capsule (300 mg total) by mouth 2 (two) times daily.     ipratropium-albuterol  (DUONEB) 0.5-2.5 (3) MG/3ML SOLN Take 3 mLs by nebulization every 4 (four) hours as  needed. 360 mL 1   levocetirizine (XYZAL) 5 MG tablet Take 5 mg by mouth at bedtime.     pantoprazole  (PROTONIX ) 40 MG tablet Take 40 mg by mouth daily.     No current facility-administered medications for this visit.    REVIEW OF SYSTEMS:  [X]  denotes positive finding, [ ]  denotes negative finding Cardiac  Comments:  Chest pain or chest pressure:    Shortness of breath upon exertion:    Short of breath when lying flat:    Irregular heart rhythm:        Vascular    Pain in calf, thigh, or hip brought on by ambulation: x   Pain in feet at  night that wakes you up from your sleep:     Blood clot in your veins:    Leg swelling:         Pulmonary    Oxygen  at home:    Productive cough:     Wheezing:         Neurologic    Sudden weakness in arms or legs:     Sudden numbness in arms or legs:     Sudden onset of difficulty speaking or slurred speech:    Temporary loss of vision in one eye:     Problems with dizziness:         Gastrointestinal    Blood in stool:     Vomited blood:         Genitourinary    Burning when urinating:     Blood in urine:        Psychiatric    Major depression:         Hematologic    Bleeding problems:    Problems with blood clotting too easily:        Skin    Rashes or ulcers:        Constitutional    Fever or chills:      PHYSICAL EXAM: There were no vitals filed for this visit.  GENERAL: The patient is a well-nourished female, in no acute distress. The vital signs are documented above. CARDIAC: There is a regular rate and rhythm.  VASCULAR:  Bilateral femoral pulses palpable Bilateral DP pulses palpable PULMONARY: No respiratory distress. ABDOMEN: Soft and non-tender. MUSCULOSKELETAL: There are no major deformities or cyanosis. NEUROLOGIC: No focal weakness or paresthesias are detected. SKIN: There are no ulcers or rashes noted. PSYCHIATRIC: The patient has a normal affect.  DATA:   ABIs 07/18/2023 that were 0.86 on the right  mildly abnormal and 0.78 on the left moderately abnormal.  Assessment/Plan:  73 y.o. female, with history of COPD, hyperlipidemia, tobacco abuse who presents for evaluation of PVD with abnormal ABIs.  Patient had ABIs on 07/18/2023 that were 0.86 on the right mildly abnormal and 0.78 on the left moderately abnormal.  There was suggestion of inflow disease based on waveforms in the aortoiliac segment.  Discussed her symptoms are consistent with intermittent claudication.  Discussed we manage claudication with lifestyle modification and walking therapies until it becomes disabling.  I have recommended medical management for now.  I have recommended smoking cessation as well as walking therapies at least 30 minutes a day 3 times a week.  She is on aspirin  daily.  We discussed statin therapy which she wants to defer.  I did prescribe Pletal and discussed she take this twice daily and up to 4 to 6 weeks to see improvement.  I will see him in 3 months with repeat ABIs.  If no improvement and/or worsening symptoms can pursue catheter-based angiography in the future.   Young Hensen, MD [facility] Office: [phone]

## 2023-09-14 ENCOUNTER — Other Ambulatory Visit: Payer: Self-pay | Admitting: *Deleted

## 2023-09-14 DIAGNOSIS — I739 Peripheral vascular disease, unspecified: Secondary | ICD-10-CM

## 2023-09-19 ENCOUNTER — Ambulatory Visit: Admitting: General Surgery

## 2023-10-26 DIAGNOSIS — J449 Chronic obstructive pulmonary disease, unspecified: Secondary | ICD-10-CM | POA: Diagnosis not present

## 2023-11-06 DIAGNOSIS — I739 Peripheral vascular disease, unspecified: Secondary | ICD-10-CM | POA: Diagnosis not present

## 2023-11-06 DIAGNOSIS — R202 Paresthesia of skin: Secondary | ICD-10-CM | POA: Diagnosis not present

## 2023-11-06 DIAGNOSIS — F17210 Nicotine dependence, cigarettes, uncomplicated: Secondary | ICD-10-CM | POA: Diagnosis not present

## 2023-11-06 DIAGNOSIS — J449 Chronic obstructive pulmonary disease, unspecified: Secondary | ICD-10-CM | POA: Diagnosis not present

## 2023-11-14 DIAGNOSIS — B351 Tinea unguium: Secondary | ICD-10-CM | POA: Diagnosis not present

## 2023-11-14 DIAGNOSIS — I739 Peripheral vascular disease, unspecified: Secondary | ICD-10-CM | POA: Diagnosis not present

## 2023-11-25 DIAGNOSIS — J449 Chronic obstructive pulmonary disease, unspecified: Secondary | ICD-10-CM | POA: Diagnosis not present

## 2023-12-07 ENCOUNTER — Ambulatory Visit: Admitting: Orthopedic Surgery

## 2023-12-07 ENCOUNTER — Encounter: Payer: Self-pay | Admitting: Orthopedic Surgery

## 2023-12-07 VITALS — BP 149/68 | HR 72 | Ht 62.0 in | Wt 148.0 lb

## 2023-12-07 DIAGNOSIS — M65949 Unspecified synovitis and tenosynovitis, unspecified hand: Secondary | ICD-10-CM

## 2023-12-07 DIAGNOSIS — M65941 Unspecified synovitis and tenosynovitis, right hand: Secondary | ICD-10-CM | POA: Diagnosis not present

## 2023-12-07 MED ORDER — METHYLPREDNISOLONE ACETATE 40 MG/ML IJ SUSP
40.0000 mg | Freq: Once | INTRAMUSCULAR | Status: AC
  Administered 2023-12-07: 40 mg via INTRA_ARTICULAR

## 2023-12-07 NOTE — Progress Notes (Signed)
  Intake history:  BP (!) 149/68   Pulse 72   Ht 5' 2 (1.575 m)   Wt 148 lb (67.1 kg)   BMI 27.07 kg/m  Body mass index is 27.07 kg/m.    WHAT ARE WE SEEING YOU FOR TODAY?   right hand(s)  How long has this bothered you? (DOI?DOS?WS?)  8 month(s) ago started having pain tingling in right hand   Anticoag.  No  Diabetes No  Heart disease No/ has lung disease COPD  Hypertension No  SMOKING HX Yes  Kidney disease No  Any ALLERGIES ______________ Allergies  Allergen Reactions   Shellfish-Derived Products Anaphylaxis   Shrimp [Shellfish Allergy] Anaphylaxis   Hydrocodone Hives and Itching   Lyrica [Pregabalin] Other (See Comments)    unknown   ________________________________   Treatment:  Have you taken:  Tylenol  No  Advil No  Had PT No  Had injection No  Other  _________________________

## 2023-12-07 NOTE — Progress Notes (Signed)
 Chief Complaint  Patient presents with   Hand Problem    Right    73 year old female with vague symptoms in her right hand which involve occasional tingling and numbness with motor activities but comes in today for pain over the volar aspect of her A1 pulley right long finger.  She noticed a locking episode where she had to pull the finger back straight when she reached for an object.  It is since straightened out but she is having pain over the A1 pulley  Right hand exam  Tenderness over the A1 pulley Full flexion extension No catching or locking Neurovascular exam intact No tingling with pressure over the carpal tunnel  Tenosynovitis right long finger  Injection recommended  Trigger finger injection  Diagnosis  right long finger  Procedure injection A1 pulley Medications lidocaine  1% 1 mL and Depo-Medrol  40 mg 1 mL Skin prep alcohol and ethyl chloride Verbal consent was obtained Timeout confirmed the injection site  After cleaning the skin with alcohol and anesthetizing the skin with ethyl chloride the A1 pulley was palpated and the injection was performed without complication

## 2023-12-26 DIAGNOSIS — J449 Chronic obstructive pulmonary disease, unspecified: Secondary | ICD-10-CM | POA: Diagnosis not present

## 2024-01-09 ENCOUNTER — Ambulatory Visit

## 2024-01-09 ENCOUNTER — Ambulatory Visit: Admitting: Vascular Surgery

## 2024-01-23 DIAGNOSIS — B351 Tinea unguium: Secondary | ICD-10-CM | POA: Diagnosis not present

## 2024-01-23 DIAGNOSIS — I739 Peripheral vascular disease, unspecified: Secondary | ICD-10-CM | POA: Diagnosis not present

## 2024-01-26 DIAGNOSIS — J449 Chronic obstructive pulmonary disease, unspecified: Secondary | ICD-10-CM | POA: Diagnosis not present

## 2024-02-21 DIAGNOSIS — R7303 Prediabetes: Secondary | ICD-10-CM | POA: Diagnosis not present

## 2024-02-21 DIAGNOSIS — J449 Chronic obstructive pulmonary disease, unspecified: Secondary | ICD-10-CM | POA: Diagnosis not present

## 2024-02-25 DIAGNOSIS — J449 Chronic obstructive pulmonary disease, unspecified: Secondary | ICD-10-CM | POA: Diagnosis not present

## 2024-02-27 DIAGNOSIS — J439 Emphysema, unspecified: Secondary | ICD-10-CM | POA: Diagnosis not present

## 2024-02-27 DIAGNOSIS — I739 Peripheral vascular disease, unspecified: Secondary | ICD-10-CM | POA: Diagnosis not present

## 2024-02-27 DIAGNOSIS — K219 Gastro-esophageal reflux disease without esophagitis: Secondary | ICD-10-CM | POA: Diagnosis not present

## 2024-02-27 DIAGNOSIS — J449 Chronic obstructive pulmonary disease, unspecified: Secondary | ICD-10-CM | POA: Diagnosis not present

## 2024-02-27 DIAGNOSIS — D751 Secondary polycythemia: Secondary | ICD-10-CM | POA: Diagnosis not present

## 2024-02-27 DIAGNOSIS — I700 Atherosclerosis of aorta: Secondary | ICD-10-CM | POA: Diagnosis not present

## 2024-02-27 DIAGNOSIS — F17200 Nicotine dependence, unspecified, uncomplicated: Secondary | ICD-10-CM | POA: Diagnosis not present

## 2024-02-27 DIAGNOSIS — R202 Paresthesia of skin: Secondary | ICD-10-CM | POA: Diagnosis not present

## 2024-02-27 DIAGNOSIS — F411 Generalized anxiety disorder: Secondary | ICD-10-CM | POA: Diagnosis not present

## 2024-02-27 DIAGNOSIS — K5904 Chronic idiopathic constipation: Secondary | ICD-10-CM | POA: Diagnosis not present

## 2024-02-27 DIAGNOSIS — E785 Hyperlipidemia, unspecified: Secondary | ICD-10-CM | POA: Diagnosis not present

## 2024-02-27 DIAGNOSIS — S76011A Strain of muscle, fascia and tendon of right hip, initial encounter: Secondary | ICD-10-CM | POA: Diagnosis not present

## 2024-02-29 ENCOUNTER — Other Ambulatory Visit (HOSPITAL_COMMUNITY): Payer: Self-pay | Admitting: Internal Medicine

## 2024-02-29 DIAGNOSIS — Z1231 Encounter for screening mammogram for malignant neoplasm of breast: Secondary | ICD-10-CM

## 2024-03-01 ENCOUNTER — Other Ambulatory Visit (HOSPITAL_COMMUNITY): Payer: Self-pay | Admitting: Family Medicine

## 2024-03-01 DIAGNOSIS — Z122 Encounter for screening for malignant neoplasm of respiratory organs: Secondary | ICD-10-CM

## 2024-03-06 ENCOUNTER — Ambulatory Visit (HOSPITAL_COMMUNITY)
Admission: RE | Admit: 2024-03-06 | Discharge: 2024-03-06 | Disposition: A | Discharge status: Home / Self Care | Source: Ambulatory Visit | Attending: Internal Medicine | Admitting: Internal Medicine

## 2024-03-06 ENCOUNTER — Encounter (HOSPITAL_COMMUNITY): Payer: Self-pay

## 2024-03-06 DIAGNOSIS — Z1231 Encounter for screening mammogram for malignant neoplasm of breast: Secondary | ICD-10-CM | POA: Diagnosis not present

## 2024-03-19 DIAGNOSIS — H25813 Combined forms of age-related cataract, bilateral: Secondary | ICD-10-CM | POA: Diagnosis not present

## 2024-03-21 ENCOUNTER — Encounter: Payer: Self-pay | Admitting: Internal Medicine

## 2024-03-21 ENCOUNTER — Ambulatory Visit: Admitting: Internal Medicine

## 2024-03-21 VITALS — BP 109/51 | HR 83 | Ht 62.0 in | Wt 154.0 lb

## 2024-03-21 DIAGNOSIS — J9611 Chronic respiratory failure with hypoxia: Secondary | ICD-10-CM | POA: Diagnosis not present

## 2024-03-21 DIAGNOSIS — F17210 Nicotine dependence, cigarettes, uncomplicated: Secondary | ICD-10-CM

## 2024-03-21 DIAGNOSIS — J449 Chronic obstructive pulmonary disease, unspecified: Secondary | ICD-10-CM

## 2024-03-21 DIAGNOSIS — J9612 Chronic respiratory failure with hypercapnia: Secondary | ICD-10-CM | POA: Diagnosis not present

## 2024-03-21 MED ORDER — GABAPENTIN 300 MG PO CAPS: 300.0000 mg | ORAL_CAPSULE | Freq: Three times a day (TID) | ORAL | 2 refills | Status: AC

## 2024-03-21 NOTE — Assessment & Plan Note (Addendum)
 HC03  01/17/22  = 32  - 02/01/2022   Walked on RA  X 3   lap(s) =  approx 450  ft  @ mod pace, stopped due to end of study  with lowest 02 sats 94% no sob  - HC03  04/25/12  = 28   Still on 02 2lpm hs > sleeping well s am HA or AMS  >>> no change rx

## 2024-03-21 NOTE — Patient Instructions (Signed)
 My office will be contacting you by phone for referral to lung cancer screening   [phone]) - if you don't hear back from my office within one week,  please call us  back or notify us  thru MyChart and we'll address it right away.    The key is to stop smoking completely before smoking completely stops you!   Please schedule a follow up visit in 6 months but call sooner if needed

## 2024-03-21 NOTE — Assessment & Plan Note (Addendum)
 Active smoker s/p aecopd> admit 01/17/22 - 02/01/2022  continue breztri   - PFT's  05/24/22  FEV1 1.02 (49 % ) ratio 0.57  p 1 % improvement from saba p Breztri  prior to study with DLCO  10.46 (57%)   and FV curve mildly concave     - 06/06/2022    75% (short Ti) > continue breztri   1-2 bid to reduce effects on oropharynx  - 08/31/2023  After extensive coaching inhaler device,  effectiveness =    75% from a baseline of 50 % (sort Ti)     Group E in terms of symptoms/risk so  laba/lama/ICS  therefore appropriate rx at this point  >>>  breztri  and now on approp SABA prn.

## 2024-03-21 NOTE — Assessment & Plan Note (Addendum)
 Counseled re importance of smoking cessation but did not meet time criteria for separate billing    >>> referred to LCS program 03/21/2024            Each maintenance medication was reviewed in detail including emphasizing most importantly the difference between maintenance and prns and under what circumstances the prns are to be triggered using an action plan format where appropriate.  Total time for H and P, chart review, counseling, reviewing hfa/ neb/02/pulse ox  device(s) and generating customized AVS unique to this office visit / same day charting = 30 min

## 2024-03-21 NOTE — Progress Notes (Signed)
 Lori Lopez, female    DOB: 03-Feb-1951    MRN: [id number]   Brief patient profile:  72  yowf active smoker  referred to pulmonary clinic in [place]  02/01/2022 by Dr Shona for copd eval   Admit date: 01/17/2022 Discharge date: 01/19/2022      Brief Hospitalization Summary: Please see all hospital notes, images, labs for full details of the hospitalization.  ADMISSION HPI: 73 y.o. female with medical history significant of COPD, GERD, tobacco abuse who presents to the emergency department via EMS due to 4-day onset of shortness of breath, wheezing, chest congestion and nonproductive cough, she went to an urgent care on Friday (9/8) but still continued to wheeze and have chest congestion.  She used home nebulizer with only minimal relief.  She continues to smoke cigarettes.  She denies fever, chills, chest pain, headache, nausea, vomiting.  Patient was never intubated due to COPD exacerbation.   ED Course:  In the emergency department, she was tachycardic, and was hypoxic on arrival (84% on room air), she was placed on supplemental oxygen  with improvement in O2 sats to 96% on supplemental oxygen  via [place] at 3 LPM, other vital signs were within normal range.  Work-up in the ED showed leukocytosis, H/H-15.6/47.7, D-dimer < 0.27, BMP was normal except for glucose of 147.  SARS coronavirus 2 was negative.  Chest x-ray showed no active disease.  Breathing treatment was provided, Decadron  10 mg x 1 was given.  Hospitalist was asked to admit patient for further evaluation and management.   HOSPITAL COURSE BY PROBLEM    Acute respiratory failure with hypoxia due to acute exacerbation of COPD Pt responded well to scheduled duo nebs, Mucinex , Solu-Medrol , azithromycin . Continue Protonix  to prevent steroid-induced ulcer Continue incentive spirometry and flutter valve Continue supplemental oxygen  to maintain O2 sat > 94% with plan to wean patient off oxygen  as tolerated (patient does not use oxygen  at  baseline) Pt feeling better, DC home today, follow up with PCP in 1 week Pt will discharge on prednisone  taper, nebulizers, doxycycline  oral tabs  Pt will discharge on home oxygen  arranged prior to discharge 2L/min     History of Present Illness  02/01/2022  Pulmonary/ 1st office eval/ Darlean / [place] [facility]  Chief Complaint  Patient presents with   Consult    Consult for abnormal ct scan in June 2023  Has been on oxygen  for about 2-3 weeks prn during day and mainly at night   Dyspnea:   very active prior to admit 100 ft to MB with a hill fine but not any more doing since d/c.  Says did use neb prior to admit but no maint resp rx or 02 Cough: white in am  Sleep: bed blocks/ 1 pillow  SABA use: hfa and neb  02 2lpm hs  Rec Plan A = Automatic = Always=    Breztri  Take 2 puffs first thing in am and then another 2 puffs about 12 hours later.  Work on inhaler technique:    Plan B = Backup (to supplement plan A, not to replace it) Only use your albuterol  inhaler as a rescue medication  Plan C = Crisis (instead of Plan B but only if Plan B stops working) - only use your albuterol  nebulizer if you first try Plan B Ok to try albuterol  15 min before an activity (on alternating days)  that you know would usually make you short of breath   02 2lpm at bedtime plus Make sure  you check your oxygen  saturation  AT  your highest level of activity (not after you stop)   to be sure it stays over 90%  Suggested e-cigs as an optional  one way bridge  Off all tobacco products      06/06/2022  f/u ov/[place] [facility]/Emrey Thornley re: GOLD 3 COPD  maint on breztri   Chief Complaint  Patient presents with   Follow-up    Breathing better since last ov  Has questions about advair inhaler   Dyspnea:  slow walking / not doing as much ex due to husband's dementia Cough: min white mostly in ams' - questions whether advair discus would be easier on mouth vs breztri  full dose ( has been rinsing and gargling already)   Sleeping: bed blocks no resp SABA use: very little  02: 2lpm hs  Covid status: never vax / never infected  Lung cancer screening: Dr [place] office q June  Rec Try breztri  Take 1 puff  first thing in am and then another 2 puffs about 12 hours later.  Work on inhaler technique:  If mouth still a problem > consider change to Stiolto (would need a visit to teach how to use though)   Use vapes in place of cigarettes and uncouple to smoking from the coffee        08/31/2023  f/u ov/[place] office/Ineta Sinning re: GOLD 3 copd / 02 hs maint on Breztri  gabapentin  300 mg bid  /still smoking  Chief Complaint  Patient presents with   COPD  Dyspnea:  limited by L hip > breathing , still doing yard work Cough: p supper and some in am > mostly clear  Sleeping: bed blocks / 2 pillows s   resp cc  SABA use: not needing  02: 2lpm hs only  LDSCT due q June  Rec Add pepcid  20 mg after supper for a month to see if helps with cough p supper   Work on inhaler technique:   >>>  Remember how golfers warm up by taking practice swings - do this with an empty inhaler  The key is to stop smoking completely before smoking completely stops you!       03/21/2024  f/u ov/[place] office/Rhett Mutschler re: GOLD 3 copd / 02 hs maint on Breztri    still smoking  Chief Complaint  Patient presents with   COPD    Productive cough   Dyspnea:  L lip stops her first  Cough: 30 min to clear am congestion   Sleeping: bed blocks/ one pillow s    resp cc  SABA use: neither hfa nor neblizer  02: 02 at hs   Lung cancer screening: referred to LCS program 03/21/2024    No obvious day to day or daytime variability or assoc excess/ purulent sputum or mucus plugs or hemoptysis or cp or chest tightness, subjective wheeze or overt sinus or hb symptoms.    Also denies any obvious fluctuation of symptoms with weather or environmental changes or other aggravating or alleviating factors except as outlined above   No unusual exposure  hx or h/o childhood pna/ asthma or knowledge of premature birth.  Current Allergies, Complete Past Medical History, Past Surgical History, Family History, and Social History were reviewed in [employer] record.  ROS  The following are not active complaints unless bolded Hoarseness, sore throat, dysphagia, dental problems, itching, sneezing,  nasal congestion when forgets to take her xyzal  or discharge of excess mucus or purulent secretions, ear ache,  fever, chills, sweats, unintended wt loss or wt gain, classically pleuritic or exertional cp,  orthopnea pnd or arm/hand swelling  or leg swelling, presyncope, palpitations, abdominal pain, anorexia, nausea, vomiting, diarrhea  or change in bowel habits or change in bladder habits, change in stools or change in urine, dysuria, hematuria,  rash, arthralgias, visual complaints, headache, numbness, weakness or ataxia or problems with walking or coordination,  change in mood or  memory.         Unsure of meds          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   Degeneration, intervertebral disc, cervicothoracic 06/13/2013   Hypothyroidism    Mixed hyperlipidemia 05/15/2013   Obesity due to excess calories 05/15/2013   Tinea unguium 05/15/2013   Vitamin D  deficiency 12/28/2009      Objective:    Wts  03/21/2024       154  08/31/2023        149  01/31/2023        161 10/31/2022        161 06/06/2022        158   03/16/22 158 lb 6.4 oz (71.8 kg)  02/01/22 151 lb 9.6 oz (68.8 kg)  01/18/22 158 lb 15.2 oz (72.1 kg)    Vital signs reviewed  03/21/2024  - Note at rest 02 sats  96% on RA   General appearance:    amb wf / slt gruff voice/ smoker's  rattle     HEENT : Oropharynx  clear/ full dentures         NECK :  without  apparent JVD/ palpable Nodes/TM    LUNGS: no acc muscle use,  Nl contour chest which is clear to A and P bilaterally without cough on insp or exp maneuvers   CV:  RRR  no s3 or murmur or increase in P2, and no edema   ABD:  soft and nontender   MS:  Gait nl   ext warm without deformities Or obvious joint restrictions  calf tenderness, cyanosis or clubbing    SKIN: warm and dry without lesions    NEURO:  alert, approp, nl sensorium with  no motor or cerebellar deficits apparent.            Assessment   Assessment & Plan COPD GOLD 3 still smoking Active smoker s/p aecopd> admit 01/17/22 - 02/01/2022  continue breztri   - PFT's  05/24/22  FEV1 1.02 (49 % ) ratio 0.57  p 1 % improvement from saba p Breztri  prior to study with DLCO  10.46 (57%)   and FV curve mildly concave     - 06/06/2022    75% (short Ti) > continue breztri   1-2 bid to reduce effects on oropharynx  - 08/31/2023  After extensive coaching inhaler device,  effectiveness =    75% from a baseline of 50 % (sort Ti)     Group E in terms of symptoms/risk so  laba/lama/ICS  therefore appropriate rx at this point  >>>  breztri  and now on approp SABA prn.   Chronic respiratory failure with hypoxia and hypercapnia (HCC) HC03  01/17/22  = 32  - 02/01/2022   Walked on RA  X 3  lap(s) =  approx 450  ft  @ mod pace, stopped due to end of study  with lowest 02 sats 94% no sob  - HC03  04/25/12  = 28   Still on 02 2lpm hs > sleeping well s am HA or AMS  >>> no change rx   Cigarette smoker Counseled re importance of smoking cessation but did not meet time criteria for separate billing    >>> referred to LCS program 03/21/2024            Each maintenance medication was reviewed in detail including emphasizing most importantly the difference between maintenance and prns and under what circumstances the prns are to be triggered using an action plan  format where appropriate.  Total time for H and P, chart review, counseling, reviewing hfa/ neb/02/pulse ox  device(s) and generating customized AVS unique to this office visit / same day charting = 30 min         AVS  Patient Instructions  My office will be contacting you by phone for referral to lung cancer screening   [phone]) - if you don't hear back from my office within one week,  please call us  back or notify us  thru MyChart and we'll address it right away.    The key is to stop smoking completely before smoking completely stops you!   Please schedule a follow up visit in 6 months but call sooner if needed       Ozell America, MD 03/21/2024

## 2024-04-09 DIAGNOSIS — B351 Tinea unguium: Secondary | ICD-10-CM | POA: Diagnosis not present

## 2024-04-09 DIAGNOSIS — I739 Peripheral vascular disease, unspecified: Secondary | ICD-10-CM | POA: Diagnosis not present

## 2024-04-15 ENCOUNTER — Telehealth: Payer: Self-pay

## 2024-04-15 DIAGNOSIS — Z87891 Personal history of nicotine dependence: Secondary | ICD-10-CM

## 2024-04-15 DIAGNOSIS — F17210 Nicotine dependence, cigarettes, uncomplicated: Secondary | ICD-10-CM

## 2024-04-15 DIAGNOSIS — Z122 Encounter for screening for malignant neoplasm of respiratory organs: Secondary | ICD-10-CM

## 2024-04-15 NOTE — Telephone Encounter (Signed)
 Lung Cancer Screening Narrative/Criteria Questionnaire (Cigarette Smokers Only- No Cigars/Pipes/vapes)   Lori Lopez   SDMV:04/30/2024 at 11:00 am Natalie         [birth year]               LDCT: 05/03/2024 at 10:30 am AP    72 y.o.   Phone: [phone]  Lung Screening Narrative (confirm age 53-77 yrs Medicare / 50-80 yrs Private pay insurance)   Insurance information:[employer]   Referring Provider: Darlean   This screening involves an initial phone call with a team member from our program. It is called a shared decision making visit. The initial meeting is required by  insurance and Medicare to make sure you understand the program. This appointment takes about 15-20 minutes to complete. You will complete the screening scan at your scheduled date/time.  This scan takes about 5-10 minutes to complete. You can eat and drink normally before and after the scan.  Criteria questions for Lung Cancer Screening:   Are you a current or former smoker? Current Age began smoking: 13   If you are a former smoker, what year did you quit smoking?Never quit over a year. (within 15 yrs)   To calculate your smoking history, I need an accurate estimate of how many packs of cigarettes you smoked per day and for how many years. (Not just the number of PPD you are now smoking)   Years smoking 59 x Packs per day 1 = Pack years 59   (at least 20 pack yrs)   (Make sure they understand that we need to know how much they have smoked in the past, not just the number of PPD they are smoking now)  Do you have a personal history of cancer?  No    Do you have a family history of cancer? Yes  (cancer type and and relative) Son had brain cancer. Father had pancreatic cancer.   Are you coughing up blood?  No  Have you had unexplained weight loss of 15 lbs or more in the last 6 months? No  It looks like you meet all criteria.  When would be a good time for us  to schedule you for this screening?   Additional  information: N/A

## 2024-04-17 DIAGNOSIS — H16223 Keratoconjunctivitis sicca, not specified as Sjogren's, bilateral: Secondary | ICD-10-CM | POA: Diagnosis not present

## 2024-04-17 DIAGNOSIS — H2513 Age-related nuclear cataract, bilateral: Secondary | ICD-10-CM | POA: Diagnosis not present

## 2024-04-17 DIAGNOSIS — H43812 Vitreous degeneration, left eye: Secondary | ICD-10-CM | POA: Diagnosis not present

## 2024-04-18 ENCOUNTER — Ambulatory Visit

## 2024-04-29 NOTE — H&P (Signed)
 Surgical History & Physical  Patient Name: Lori Lopez  DOB: 08-29-1950  Surgery: Cataract extraction with intraocular lens implant phacoemulsification; Right Eye Surgeon: Marsa Cleverly MD Surgery Date: 05/07/2024 Pre-Op Date: 04/17/2024  HPI: A 70 Yr. old female patient 1.  Patient is present today for a cataract evaluation. The patient complains of difficulty when driving due to glare from headlights or sun, which began 6 months ago. Both eyes are affected. The episode is gradual. The patient describes foggy and hazy symptoms affecting their eyes/vision. This is negatively affecting the patient's quality of life and the patient is unable to function adequately in life with the current level of vision.  Medical History: Dry Eyes Cataracts  Arthritis Lung Problems  Review of Systems Musculoskeletal ARTHRITIS Respiratory COPD All recorded systems are negative except as noted above.  Social Current some day smoker   Medication Prednisolone-moxiflox-bromfen,  Cilostazol , Cyclosporine, Levocetirizine, Fluticasone  propionate  Sx/Procedures Back Surgery, Neck surgery  Drug Allergies  NKDA  History & Physical: Heent: cataracts NECK: supple without bruits LUNGS: lungs clear to auscultation CV: regular rate and rhythm Abdomen: soft and non-tender  Impression & Plan: Assessment: 1.  CATARACT NUCLEAR SCLEROSIS AGE RELATED; Both Eyes (H25.13) 2.  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  Family History of AMD 5.  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  Cataracts are visually significant and account for the patient's complaints. Discussed all risks, benefits, procedures and recovery, including infection, loss of vision and eye, need for glasses after surgery or additional procedures. Patient understands changing glasses will not improve vision. Patient indicated understanding of  procedure. All questions answered. Patient desires to have surgery, recommend phacoemulsification with intraocular lens. Patient to have preliminary testing necessary ([facility]/IOL Master, Mac OCT, TOPO) Educational materials provided:Cataract.  Plan: - Proceed with cataract surgery OD followed by OS - Plan for best distance target with DIB00 - No DM, no fuchs, no prior eye surgery - good dilation - no trauma, no CL use - Dextenza  if available  2.  Dry eye. Mild signs of dry eye at this time. Can use artificial tears QID OU PRN and warm compresses once daily as needed.  3.  No retinal tear or retinal detachment. I told the patient to contact me ASAP for new onset/worsening of floaters, flashes, or vision loss.  4.  In Mother  5.  1+ fine drusen centrally. 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 OCT mac obtained today. Diagnosis reviewed with patient. Suggest amsler grid check weekly or so and AREDs vitamin. Return in 6 months for repeat OCT mac.

## 2024-04-30 ENCOUNTER — Encounter: Payer: Self-pay | Admitting: *Deleted

## 2024-04-30 ENCOUNTER — Ambulatory Visit (INDEPENDENT_AMBULATORY_CARE_PROVIDER_SITE_OTHER): Admitting: *Deleted

## 2024-04-30 DIAGNOSIS — F17210 Nicotine dependence, cigarettes, uncomplicated: Secondary | ICD-10-CM

## 2024-04-30 NOTE — Patient Instructions (Signed)

## 2024-04-30 NOTE — Progress Notes (Signed)
" ° °  Virtual Visit via Telephone Note  I connected with Lori Lopez on 04/30/2024 at 11:00 AM EST by telephone and verified that I am speaking with the correct person using two identifiers.  Location: Patient: at home Provider: [address] [place], [place], [address]    I discussed the limitations, risks, security and privacy concerns of performing an evaluation and management service by telephone and the availability of in person appointments. I also discussed with the patient that there may be a patient responsible charge related to this service. The patient expressed understanding and agreed to proceed.   Shared Decision Making Visit Lung Cancer Screening Program [phone])   Eligibility: Age 73 y.o. Pack Years Smoking History Calculation 60 (# packs/per year x # years smoked) Recent History of coughing up blood  no Unexplained weight loss? no ( >Than 15 pounds within the last 6 months ) Prior History Lung / other cancer no (Diagnosis within the last 5 years already requiring surveillance chest CT Scans). Smoking Status Current smoker Former Smokers: Years since quit: n/a  Quit Date: n/a  Visit Components: Discussion included one or more decision making aids. yes Discussion included risk/benefits of screening. yes Discussion included potential follow up diagnostic testing for abnormal scans. yes Discussion included meaning and risk of over diagnosis. yes Discussion included meaning and risk of False Positives. yes Discussion included meaning of total radiation exposure. yes  Counseling Included: Importance of adherence to annual lung cancer LDCT screening. yes Impact of comorbidities on ability to participate in the program. yes Ability and willingness to under diagnostic treatment. yes  Smoking Cessation Counseling: Current Smokers:  Discussed importance of smoking cessation. yes Information about tobacco cessation classes and interventions provided to  patient. yes Patient provided with ticket for LDCT Scan. no Symptomatic Patient. no  Counseling(Intermediate counseling: > three minutes) [id number] Diagnosis Code: Tobacco Use Z72.0 Asymptomatic Patient yes   Smoking/Tobacco Cessation Counseling Lori Lopez is a current user of tobacco or nicotine  products. She is considering quitting at this time. Counseling provided today addressed the risks of continued use and the benefits of cessation. Discussed tobacco/nicotine  use history, readiness to quit, and evidence-based treatment options including behavioral strategies, support resources, and pharmacologic therapies. Provided encouragement and educational materials on steps and resources to quit smoking. Patient questions were addressed, and follow-up recommended for continued support. Total time spent on counseling: 4 minutes.   Former Smokers:  Discussed the importance of maintaining cigarette abstinence. yes Diagnosis Code: Personal History of Nicotine  Dependence. S12.108 Information about tobacco cessation classes and interventions provided to patient. Yes Patient provided with ticket for LDCT Scan. no Written Order for Lung Cancer Screening with LDCT placed in Epic. Yes (CT Chest Lung Cancer Screening Low Dose W/O CM) [id number] Z12.2-Screening of respiratory organs Z87.891-Personal history of nicotine  dependence   Laneta Speaks, RN       "

## 2024-05-03 ENCOUNTER — Ambulatory Visit (HOSPITAL_COMMUNITY)
Admission: RE | Admit: 2024-05-03 | Discharge: 2024-05-03 | Disposition: A | Discharge status: Home / Self Care | Source: Ambulatory Visit | Attending: Optometry | Admitting: Optometry

## 2024-05-03 DIAGNOSIS — F17210 Nicotine dependence, cigarettes, uncomplicated: Secondary | ICD-10-CM | POA: Insufficient documentation

## 2024-05-03 DIAGNOSIS — Z122 Encounter for screening for malignant neoplasm of respiratory organs: Secondary | ICD-10-CM | POA: Insufficient documentation

## 2024-05-03 DIAGNOSIS — Z87891 Personal history of nicotine dependence: Secondary | ICD-10-CM | POA: Diagnosis present

## 2024-05-07 ENCOUNTER — Other Ambulatory Visit: Payer: Self-pay

## 2024-05-07 ENCOUNTER — Ambulatory Visit (HOSPITAL_COMMUNITY)

## 2024-05-07 ENCOUNTER — Encounter (HOSPITAL_COMMUNITY)
Admission: RE | Disposition: A | Discharge status: Home / Self Care | Payer: Self-pay | Source: Home / Self Care | Attending: Optometry

## 2024-05-07 ENCOUNTER — Ambulatory Visit (HOSPITAL_COMMUNITY)
Admission: RE | Admit: 2024-05-07 | Discharge: 2024-05-07 | Disposition: A | Discharge status: Home / Self Care | Attending: Optometry | Admitting: Optometry

## 2024-05-07 ENCOUNTER — Encounter (HOSPITAL_COMMUNITY): Payer: Self-pay | Admitting: Optometry

## 2024-05-07 DIAGNOSIS — F17210 Nicotine dependence, cigarettes, uncomplicated: Secondary | ICD-10-CM

## 2024-05-07 DIAGNOSIS — H43812 Vitreous degeneration, left eye: Secondary | ICD-10-CM | POA: Diagnosis not present

## 2024-05-07 DIAGNOSIS — H2511 Age-related nuclear cataract, right eye: Secondary | ICD-10-CM | POA: Diagnosis present

## 2024-05-07 DIAGNOSIS — K219 Gastro-esophageal reflux disease without esophagitis: Secondary | ICD-10-CM | POA: Diagnosis not present

## 2024-05-07 DIAGNOSIS — H16229 Keratoconjunctivitis sicca, not specified as Sjogren's, unspecified eye: Secondary | ICD-10-CM | POA: Diagnosis not present

## 2024-05-07 DIAGNOSIS — J449 Chronic obstructive pulmonary disease, unspecified: Secondary | ICD-10-CM | POA: Diagnosis not present

## 2024-05-07 DIAGNOSIS — H5711 Ocular pain, right eye: Secondary | ICD-10-CM | POA: Insufficient documentation

## 2024-05-07 DIAGNOSIS — E039 Hypothyroidism, unspecified: Secondary | ICD-10-CM | POA: Insufficient documentation

## 2024-05-07 HISTORY — PX: CATARACT EXTRACTION W/PHACO: SHX586

## 2024-05-07 HISTORY — PX: INSERTION, STENT, DRUG-ELUTING, LACRIMAL CANALICULUS: SHX7453

## 2024-05-07 SURGERY — PHACOEMULSIFICATION, CATARACT, WITH IOL INSERTION
Anesthesia: Monitor Anesthesia Care | Site: Eye | Laterality: Right

## 2024-05-07 MED ORDER — TROPICAMIDE 1 % OP SOLN
1.0000 [drp] | OPHTHALMIC | Status: AC
  Administered 2024-05-07 (×3): 1 [drp] via OPHTHALMIC

## 2024-05-07 MED ORDER — MOXIFLOXACIN HCL 5 MG/ML IO SOLN
INTRAOCULAR | Status: DC | PRN
  Administered 2024-05-07: .2 mL via OPHTHALMIC

## 2024-05-07 MED ORDER — LIDOCAINE HCL (PF) 1 % IJ SOLN
INTRAMUSCULAR | Status: DC | PRN
  Administered 2024-05-07: 2 mL

## 2024-05-07 MED ORDER — POVIDONE-IODINE 5 % OP SOLN
OPHTHALMIC | Status: DC | PRN
  Administered 2024-05-07: 1 via OPHTHALMIC

## 2024-05-07 MED ORDER — LACTATED RINGERS IV SOLN: INTRAVENOUS | Status: DC

## 2024-05-07 MED ORDER — TETRACAINE HCL 0.5 % OP SOLN
1.0000 [drp] | OPHTHALMIC | Status: AC
  Administered 2024-05-07 (×3): 1 [drp] via OPHTHALMIC

## 2024-05-07 MED ORDER — PHENYLEPHRINE-KETOROLAC 1-0.3 % IO SOLN
INTRAOCULAR | Status: DC | PRN
  Administered 2024-05-07: 500 mL via OPHTHALMIC

## 2024-05-07 MED ORDER — DEXAMETHASONE 0.4 MG OP INST
VAGINAL_INSERT | OPHTHALMIC | Status: AC
  Filled 2024-05-07: qty 1

## 2024-05-07 MED ORDER — SODIUM CHLORIDE 0.9% FLUSH
INTRAVENOUS | Status: DC | PRN
  Administered 2024-05-07 (×2): 5 mL via INTRAVENOUS

## 2024-05-07 MED ORDER — IPRATROPIUM-ALBUTEROL 0.5-2.5 (3) MG/3ML IN SOLN
RESPIRATORY_TRACT | Status: AC
  Filled 2024-05-07: qty 3

## 2024-05-07 MED ORDER — PHENYLEPHRINE HCL 2.5 % OP SOLN
1.0000 [drp] | OPHTHALMIC | Status: AC
  Administered 2024-05-07 (×3): 1 [drp] via OPHTHALMIC

## 2024-05-07 MED ORDER — LIDOCAINE HCL 3.5 % OP GEL
1.0000 | Freq: Once | OPHTHALMIC | Status: AC
  Administered 2024-05-07: 1 via OPHTHALMIC

## 2024-05-07 MED ORDER — BSS IO SOLN
INTRAOCULAR | Status: DC | PRN
  Administered 2024-05-07: 15 mL via INTRAOCULAR

## 2024-05-07 MED ORDER — MIDAZOLAM HCL 2 MG/2ML IJ SOLN
INTRAMUSCULAR | Status: AC
  Filled 2024-05-07: qty 2

## 2024-05-07 MED ORDER — MIDAZOLAM HCL 5 MG/5ML IJ SOLN
INTRAMUSCULAR | Status: DC | PRN
  Administered 2024-05-07 (×2): 1 mg via INTRAVENOUS

## 2024-05-07 MED ORDER — STERILE WATER FOR IRRIGATION IR SOLN
Status: DC | PRN
  Administered 2024-05-07: 250 mL

## 2024-05-07 MED ORDER — DEXAMETHASONE 0.4 MG OP INST
VAGINAL_INSERT | OPHTHALMIC | Status: DC | PRN
  Administered 2024-05-07: .4 mg via OPHTHALMIC

## 2024-05-07 MED ORDER — SIGHTPATH DOSE#1 NA HYALUR & NA CHOND-NA HYALUR IO KIT
PACK | INTRAOCULAR | Status: DC | PRN
  Administered 2024-05-07: 1 via OPHTHALMIC

## 2024-05-07 MED ORDER — IPRATROPIUM-ALBUTEROL 0.5-2.5 (3) MG/3ML IN SOLN
3.0000 mL | Freq: Once | RESPIRATORY_TRACT | Status: AC
  Administered 2024-05-07: 3 mL via RESPIRATORY_TRACT

## 2024-05-07 SURGICAL SUPPLY — 11 items
CLOTH BEACON ORANGE TIMEOUT ST (SAFETY) ×1 IMPLANT
DRSG TEGADERM 4X4.75 (GAUZE/BANDAGES/DRESSINGS) ×1 IMPLANT
EYE SHIELD UNIVERSAL CLEAR (GAUZE/BANDAGES/DRESSINGS) IMPLANT
FEE CATARACT SUITE SIGHTPATH (MISCELLANEOUS) ×1 IMPLANT
GLOVE BIOGEL PI IND STRL 7.0 (GLOVE) ×2 IMPLANT
LENS IOL TECNIS EYHANCE 25.0 (Intraocular Lens) IMPLANT
NEEDLE HYPO 18GX1.5 BLUNT FILL (NEEDLE) ×1 IMPLANT
PAD ARMBOARD POSITIONER FOAM (MISCELLANEOUS) ×1 IMPLANT
SYR TB 1ML LL NO SAFETY (SYRINGE) ×1 IMPLANT
TAPE SURG TRANSPORE 1 IN (GAUZE/BANDAGES/DRESSINGS) IMPLANT
WATER STERILE IRR 250ML POUR (IV SOLUTION) ×1 IMPLANT

## 2024-05-07 NOTE — Interval H&P Note (Signed)
 History and Physical Interval Note:  05/07/2024 7:35 AM  Lori Lopez Marina  has presented today for surgery, with the diagnosis of nuclear sclerotic cataract, right eye.  The various methods of treatment have been discussed with the patient and family. After consideration of risks, benefits and other options for treatment, the patient has consented to  Procedures: PHACOEMULSIFICATION, CATARACT, WITH IOL INSERTION (Right) INSERTION, STENT, DRUG-ELUTING, LACRIMAL CANALICULUS (Right) as a surgical intervention.  The patient's history has been reviewed, patient examined, no change in status, stable for surgery.  I have reviewed the patient's chart and labs.  Questions were answered to the patient's satisfaction.    The H and P was reviewed and updated. The patient was examined.  No changes were found after exam.  The surgical eye was marked.   Jayten Gabbard

## 2024-05-07 NOTE — Anesthesia Preprocedure Evaluation (Addendum)
"                                    Anesthesia Evaluation  Patient identified by MRN, date of birth, ID band Patient awake    Reviewed: Allergy & Precautions, H&P , NPO status , Patient's Chart, lab work & pertinent test results  Airway Mallampati: II  TM Distance: >3 FB Neck ROM: Full    Dental no notable dental hx.    Pulmonary COPD, Current Smoker   + rhonchi  + wheezing      Cardiovascular negative cardio ROS Normal cardiovascular exam Rhythm:Regular Rate:Normal     Neuro/Psych  PSYCHIATRIC DISORDERS Anxiety     negative neurological ROS     GI/Hepatic Neg liver ROS,GERD  ,,  Endo/Other  Hypothyroidism    Renal/GU Renal disease  negative genitourinary   Musculoskeletal negative musculoskeletal ROS (+)    Abdominal   Peds negative pediatric ROS (+)  Hematology negative hematology ROS (+)   Anesthesia Other Findings   Reproductive/Obstetrics negative OB ROS                              Anesthesia Physical Anesthesia Plan  ASA: 3  Anesthesia Plan: MAC   Post-op Pain Management:    Induction:   PONV Risk Score and Plan:   Airway Management Planned: Nasal Cannula  Additional Equipment:   Intra-op Plan:   Post-operative Plan:   Informed Consent: I have reviewed the patients History and Physical, chart, labs and discussed the procedure including the risks, benefits and alternatives for the proposed anesthesia with the patient or authorized representative who has indicated his/her understanding and acceptance.     Dental advisory given  Plan Discussed with: CRNA  Anesthesia Plan Comments:          Anesthesia Quick Evaluation  "

## 2024-05-07 NOTE — Op Note (Signed)
 Date of procedure: 05/07/2024  Pre-operative diagnosis: Visually significant age-related nuclear cataract, Right Eye (H25.11)  Post-operative diagnosis: Visually significant age-related nuclear cataract, Right Eye H25.11; Ocular Pain and Inflammation, Right eye H57.11  Procedure: Removal of cataract via phacoemulsification and insertion of intra-ocular lens J&J DIBOO +25.0D into the capsular bag of the Right Eye, Dextenza  Implantation into right lower punctum CPT [phone]  Attending surgeon: Marsa Cleverly, MD  Anesthesia: MAC, Topical Akten   Complications: None  Estimated Blood Loss: <73mL (minimal)  Specimens: None  Implants:  Implant Name Type Inv. Item Serial No. Manufacturer Lot No. LRB No. Used Action  LENS IOL TECNIS EYHANCE 25.0 - [id number] Intraocular Lens LENS IOL TECNIS EYHANCE 25.0 [id number] SIGHTPATH  Right 1 Implanted    Indications:  Visually significant age-related cataract, Right Eye  Procedure:  The patient was seen and identified in the pre-operative area. The operative eye was identified and dilated.  The operative eye was marked.  Topical anesthesia was administered to the operative eye.     The patient was then to the operative suite and placed in the supine position.  A timeout was performed confirming the patient, procedure to be performed, and all other relevant information.   The patient's face was prepped and draped in the usual fashion for intra-ocular surgery.  A lid speculum was placed into the operative eye and the surgical microscope moved into place and focused.  A superotemporal paracentesis was created using a 20 gauge paracentesis blade.  BSS mixed with Omidria , followed by 1% lidocaine  was injected into the anterior chamber.  Viscoelastic was injected into the anterior chamber.  A temporal clear-corneal main wound incision was created using a 2.69mm microkeratome.  A continuous curvilinear capsulorrhexis was initiated using an irrigating cystitome and  completed using capsulorrhexis forceps.  Hydrodissection and hydrodeliniation were performed.  Viscoelastic was injected into the anterior chamber.  A phacoemulsification handpiece and a chopper as a second instrument were used to remove the nucleus and epinucleus. The irrigation/aspiration handpiece was used to remove any remaining cortical material.   The capsular bag was reinflated with viscoelastic, checked, and found to be intact.  The intraocular lens was inserted into the capsular bag.  The irrigation/aspiration handpiece was used to remove any remaining viscoelastic.  The clear corneal wound and paracentesis wounds were then hydrated and checked with Weck-Cels to be watertight. Moxifloxacin  was instilled into the anterior chamber.  The lid-speculum and drape were removed. The lower punctum was dilated, and the dextenza  implant was inserted into it. The patient's face was cleaned with a wet and dry 4x4. A clear shield was taped over the eye. The patient was taken to the post-operative care unit in good condition, having tolerated the procedure well.  Post-Op Instructions: The patient will follow up at [facility] for a same day post-operative evaluation and will receive all other orders and instructions.

## 2024-05-07 NOTE — Anesthesia Postprocedure Evaluation (Signed)
"   Anesthesia Post Note  Patient: RILEE KNOLL  Procedure(s) Performed: PHACOEMULSIFICATION, CATARACT, WITH IOL INSERTION (Right: Eye) INSERTION, STENT, DRUG-ELUTING, LACRIMAL CANALICULUS (Right: Eye)  Patient location during evaluation: PACU Anesthesia Type: MAC Level of consciousness: awake and alert Pain management: pain level controlled Vital Signs Assessment: post-procedure vital signs reviewed and stable Respiratory status: spontaneous breathing, nonlabored ventilation, respiratory function stable and patient connected to nasal cannula oxygen  Cardiovascular status: stable and blood pressure returned to baseline Postop Assessment: no apparent nausea or vomiting Anesthetic complications: no   No notable events documented.   Last Vitals:  Vitals:   05/07/24 0713 05/07/24 0832  BP: (!) 138/57 129/64  Pulse: 72 81  Resp: 19   Temp: 36.6 C 36.4 C  SpO2: 97% 97%    Last Pain:  Vitals:   05/07/24 0832  TempSrc: Oral  PainSc:                  Andrea Limes      "

## 2024-05-07 NOTE — Discharge Instructions (Signed)
 Please discharge patient when stable, will follow up today with Dr. Ilsa Iha at the [facility] office immediately following discharge.  Leave shield in place until visit.  All paperwork with discharge instructions will be given at the office.  [facility] Address:  [address]  [place], [place] [address]  Dr. Chaya Jan Phone: [phone]

## 2024-05-07 NOTE — Transfer of Care (Signed)
 Immediate Anesthesia Transfer of Care Note  Patient: Lori Lopez  Procedure(s) Performed: PHACOEMULSIFICATION, CATARACT, WITH IOL INSERTION (Right: Eye) INSERTION, STENT, DRUG-ELUTING, LACRIMAL CANALICULUS (Right: Eye)  Patient Location: Short Stay  Anesthesia Type:MAC  Level of Consciousness: awake  Airway & Oxygen  Therapy: Patient Spontanous Breathing  Post-op Assessment: Report given to RN  Post vital signs: Reviewed  Last Vitals:  Vitals Value Taken Time  BP    Temp    Pulse    Resp    SpO2      Last Pain:  Vitals:   05/07/24 0830  TempSrc:   PainSc: 4          Complications: No notable events documented.

## 2024-05-08 ENCOUNTER — Encounter (HOSPITAL_COMMUNITY): Payer: Self-pay | Admitting: Optometry

## 2024-05-10 ENCOUNTER — Ambulatory Visit

## 2024-05-10 VITALS — BP 107/52 | HR 77 | Temp 97.7°F | Ht 62.0 in | Wt 155.0 lb

## 2024-05-10 DIAGNOSIS — Z0001 Encounter for general adult medical examination with abnormal findings: Secondary | ICD-10-CM

## 2024-05-10 DIAGNOSIS — Z13220 Encounter for screening for lipoid disorders: Secondary | ICD-10-CM | POA: Diagnosis not present

## 2024-05-10 DIAGNOSIS — Z79899 Other long term (current) drug therapy: Secondary | ICD-10-CM | POA: Diagnosis not present

## 2024-05-10 DIAGNOSIS — F411 Generalized anxiety disorder: Secondary | ICD-10-CM | POA: Diagnosis not present

## 2024-05-10 DIAGNOSIS — R7309 Other abnormal glucose: Secondary | ICD-10-CM

## 2024-05-10 DIAGNOSIS — Z113 Encounter for screening for infections with a predominantly sexual mode of transmission: Secondary | ICD-10-CM | POA: Diagnosis not present

## 2024-05-10 DIAGNOSIS — Z7689 Persons encountering health services in other specified circumstances: Secondary | ICD-10-CM

## 2024-05-10 DIAGNOSIS — E039 Hypothyroidism, unspecified: Secondary | ICD-10-CM | POA: Diagnosis not present

## 2024-05-10 DIAGNOSIS — Z23 Encounter for immunization: Secondary | ICD-10-CM

## 2024-05-10 DIAGNOSIS — J449 Chronic obstructive pulmonary disease, unspecified: Secondary | ICD-10-CM

## 2024-05-10 DIAGNOSIS — Z0000 Encounter for general adult medical examination without abnormal findings: Secondary | ICD-10-CM

## 2024-05-10 MED ORDER — MONTELUKAST SODIUM 10 MG PO TABS: 10.0000 mg | ORAL_TABLET | Freq: Every day | ORAL | 5 refills | Status: DC

## 2024-05-10 NOTE — Progress Notes (Signed)
 "  New Patient Office Visit  Subjective    Patient ID: Lori Lopez, female    DOB: [birth year]  Age: 74 y.o. MRN: [id number]  HPI Lori Lopez presents to establish care and for annual physical.   The patient comes in today for a wellness visit.  A review of their health history was completed. A review of medications was also completed.  Any needed refills; Would like refill on Singulair  Eating habits: Okay, says she can do better with avoiding foods high in sugar and carbohydrates.   Falls/  MVA accidents in past few months: No  Regular exercise: Yes, is very active. Tries to walk but has not been able to with the colder weather  Sleep: Good   Menstrual cycles/sexual history: Post-menopausal, not currently sexually active  Specialist pt sees on regular basis: Pulmonology, ophthalmology, vascular surgery  Regular eye/dental exams: Yes  Preventative health issues were discussed.   Additional concerns: Would like to get a refill on Singulair. Was taking it but ran out of her prescription.   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   Degeneration, intervertebral disc, cervicothoracic 06/13/2013   Hypothyroidism    Mixed hyperlipidemia 05/15/2013   Obesity due to excess calories 05/15/2013   Tinea unguium 05/15/2013   Vitamin D  deficiency 12/28/2009    Past Surgical History:  Procedure Laterality Date   BACK SURGERY     BIOPSY  11/07/2017   Procedure: BIOPSY;  Surgeon: Harvey Margo CROME, MD;  [facility]: [facility];  Service: Endoscopy;;  gastric biopsy    CATARACT EXTRACTION W/PHACO Right 05/07/2024   Procedure: PHACOEMULSIFICATION, CATARACT, WITH IOL INSERTION;  Surgeon: Juli Blunt, MD;  Location: [facility];  Service: Ophthalmology;  Laterality: Right;  CDE:12.14   CERVICAL DISC SURGERY     COLONOSCOPY  2008   Dr. Harvey: normal colon, small internal hemorrhoids.    COLONOSCOPY N/A 04/08/2016   Dr. Harvey: examined portion of ileum normal. two 5-6 mm polyps in descending colon and transverse colon, three 2-4 mm polyps in rectum, (all hyperplastic), internal and external hemorrhoids. Repeat in 10 years   COLONOSCOPY N/A 07/31/2023   Procedure: COLONOSCOPY;  Surgeon: Cindie Carlin POUR, DO;  [facility]: [facility];  Service: Endoscopy;  Laterality: N/A;   COLONOSCOPY WITH PROPOFOL  N/A 09/22/2020   Procedure: COLONOSCOPY WITH PROPOFOL ;  Surgeon: Cindie Carlin POUR, DO;  [facility]: [facility];  Service: Endoscopy;  Laterality: N/A;  2:45pm   ESOPHAGOGASTRODUODENOSCOPY (EGD) WITH PROPOFOL  N/A 11/07/2017   Dr. Harvey: Normal esophagus status post dilation.  Mild gastritis with benign biopsies   INSERTION, STENT, DRUG-ELUTING, LACRIMAL CANALICULUS Right 05/07/2024   Procedure: INSERTION, STENT, DRUG-ELUTING, LACRIMAL CANALICULUS;  Surgeon: Juli Blunt, MD;  Location: [facility];  Service: Ophthalmology;  Laterality: Right;   POLYPECTOMY  09/22/2020   Procedure: POLYPECTOMY;  Surgeon: Cindie Carlin POUR, DO;  [facility];  Service: Endoscopy;;   POLYPECTOMY  07/31/2023   Procedure: POLYPECTOMY;  Surgeon: Cindie Carlin POUR, DO;  [facility];  Service: Endoscopy;;   SAVORY DILATION N/A 11/07/2017   Procedure: SAVORY DILATION;  Surgeon: Harvey Margo CROME, MD;  [facility];  Service: Endoscopy;  Laterality: N/A;    Family History  Problem  Relation Age of Onset   Diabetes Mother    CAD Mother    Heart disease Mother    CAD Father    Colon cancer Neg Hx    Colon polyps Neg Hx     Social History   Socioeconomic History   Marital status: Widowed    Spouse name: Not on file   Number of children: Not on file   Years of education: Not on  file   Highest education level: Not on file  Occupational History   Not on file  Tobacco Use   Smoking status: Every Day    Current packs/day: 1.00    Average packs/day: 1 pack/day for 60.0 years (60.0 ttl pk-yrs)    Types: Cigarettes    Start date: 1966   Smokeless tobacco: Never   Tobacco comments:    vapes  Vaping Use   Vaping status: Every Day   Substances: Flavoring  Substance and Sexual Activity   Alcohol use: No    Alcohol/week: 0.0 standard drinks of alcohol   Drug use: No   Sexual activity: Yes    Birth control/protection: Surgical  Other Topics Concern   Not on file  Social History Narrative   Not on file   Social Drivers of Health   Tobacco Use: High Risk (05/10/2024)   Patient History    Smoking Tobacco Use: Every Day    Smokeless Tobacco Use: Never    Passive Exposure: Not on file  Financial Resource Strain: Not on file  Food Insecurity: Low Risk (07/15/2022)   Received from [facility]   Epic    Within the past 12 months, you worried that your food would run out before you got money to buy more: Not on file    Within the past 12 months, the food you bought just didn't last and you didn't have money to get more. : Never true  Transportation Needs: No Transportation Needs (07/15/2022)   Received from [facility]    In the past 12 months, has lack of reliable transportation kept you from medical appointments, meetings, work or from getting things needed for daily living? : No  Physical Activity: Not on file  Stress: Not on file  Social Connections: Not on file  Intimate Partner Violence: Not At Risk (04/24/2022)   Humiliation, Afraid, Rape, and Kick questionnaire    Fear of Current or Ex-Partner: No    Emotionally Abused: No    Physically Abused: No    Sexually Abused: No  Depression (PHQ2-9): Low Risk (05/10/2024)   Depression (PHQ2-9)    PHQ-2 Score: 4  Alcohol Screen: Not on file  Housing: Low Risk (07/15/2022)   Received from [facility]   Epic    What is your living situation today?: I have a steady place to live    Think about the place you live. Do you have problems with any of the following? Choose all that apply:: Not on file  Utilities: Low Risk (07/15/2022)   Received from [facility]   Utilities    In the past 12 months has the electric, gas, oil, or water  company threatened to shut off services in your home? : No  Health Literacy: Not on file    Review of Systems  Constitutional:  Negative for fatigue and fever.  HENT:  Negative for sore throat.   Eyes:  Positive for visual disturbance.  Respiratory:  Negative for cough, shortness of breath and wheezing.   Cardiovascular:  Negative for chest pain.  Gastrointestinal:  Negative for constipation, diarrhea, nausea and vomiting.  Genitourinary:  Negative for difficulty urinating, menstrual problem, vaginal bleeding and vaginal discharge.  Neurological:  Negative for headaches.  Psychiatric/Behavioral:  Negative for behavioral problems, decreased concentration and sleep disturbance. The patient is not nervous/anxious.     Objective    BP (!) 107/52   Pulse 77   Temp 97.7 F (36.5 C)   Ht 5' 2 (1.575 m)   Wt 155 lb (70.3 kg)   SpO2 97%   BMI 28.35 kg/m   Physical Exam Vitals and nursing note reviewed.  Constitutional:      General: She is not in acute distress.    Appearance: Normal appearance. She is not ill-appearing.  Neck:     Thyroid : No thyroid  mass, thyromegaly or thyroid  tenderness.  Cardiovascular:     Rate and Rhythm: Normal rate and regular rhythm.     Heart sounds: Normal heart sounds, S1 normal and S2 normal. No murmur heard. Pulmonary:     Effort: Pulmonary effort is normal. No respiratory distress.     Breath sounds: Normal breath sounds. No wheezing.  Abdominal:     General: There is no distension.     Palpations: Abdomen is soft.     Tenderness: There is no abdominal tenderness.     Hernia: No hernia is present.   Musculoskeletal:     Right lower leg: No edema.     Left lower leg: No edema.  Lymphadenopathy:     Cervical: No cervical adenopathy.  Skin:    General: Skin is warm and dry.  Neurological:     Mental Status: She is alert. Mental status is at baseline.  Psychiatric:        Mood and Affect: Mood normal.        Behavior: Behavior normal.        Thought Content: Thought content normal.        Judgment: Judgment normal.       05/10/2024    1:40 PM  Depression screen PHQ 2/9  Decreased Interest 0  Down, Depressed, Hopeless 0  PHQ - 2 Score 0  Altered sleeping 0  Tired, decreased energy 0  Change in appetite 2  Feeling bad or failure about yourself  1  Trouble concentrating 1  Moving slowly or fidgety/restless 0  Suicidal thoughts 0  PHQ-9 Score 4  Difficult doing work/chores Not difficult at all       05/10/2024    1:40 PM  GAD 7 : Generalized Anxiety Score  Nervous, Anxious, on Edge 1  Control/stop worrying 1  Worry too much - different things 1  Trouble relaxing 1  Restless 1  Easily annoyed or irritable 1  Afraid - awful might happen 1  Total GAD 7 Score 7  Anxiety Difficulty Not difficult at all   The ASCVD Risk score (Arnett DK, et al., 2019) failed to calculate for the following reasons:   Cannot find a previous HDL lab   Cannot find a previous total cholesterol lab   * - Cholesterol units were assumed   Assessment & Plan:  1. Encounter to establish care  2. Well woman exam without gynecological exam (Primary) Adult wellness-complete.wellness physical was conducted today. Importance of diet and exercise were discussed in detail.  Importance of stress reduction and healthy living were discussed.  In addition to this a discussion regarding safety was also covered.  We also reviewed over immunizations and gave recommendations  regarding current immunization needed for age.   In addition to this additional areas were also touched on including: Preventative  health exams needed: Lung cancer screening  Patient was advised yearly wellness exam   3. Hypothyroidism, unspecified type -Patient not currently on treatment. Will recheck thyroid  function to evaluate if she needs to be on medication.  - TSH + free T4  4. COPD GOLD 3 still smoking -Patient COPD stable at this time. Follows up with pulmonology.  -Counseled patient on smoking cessation. Patient has tried Chantix, but unsuccessful. Patient will continue working on smoking cessation. Will let us  know if she needs any help with this.  - Ambulatory Referral for Lung Cancer Scre - montelukast (SINGULAIR) 10 MG tablet; Take 1 tablet (10 mg total) by mouth at bedtime.  Dispense: 30 tablet; Refill: 5  5. GAD (generalized anxiety disorder) -Advised patient that if anxiety causes decrease in functioning to let me know. Patient feels like her anxiety is stable at this time.   6. High risk medication use - CBC with Differential  7. Elevated glucose -Educated patient about decreasing intake of foods high in sugars and carbohydrates. Patient agrees.  - Comprehensive metabolic panel with GFR - Hemoglobin A1c  8. Screening for lipid disorders - Lipid panel  9. Screening for STD (sexually transmitted disease) - Hepatitis C antibody  10. Immunization due - Pneumococcal conjugate vaccine 20-valent (Prevnar 20)   Return in about 6 months (around 11/07/2024).   Damien KATHEE Pringle, FNP  "

## 2024-05-13 ENCOUNTER — Other Ambulatory Visit: Payer: Self-pay

## 2024-05-13 DIAGNOSIS — Z87891 Personal history of nicotine dependence: Secondary | ICD-10-CM

## 2024-05-13 DIAGNOSIS — F17210 Nicotine dependence, cigarettes, uncomplicated: Secondary | ICD-10-CM

## 2024-05-13 DIAGNOSIS — Z122 Encounter for screening for malignant neoplasm of respiratory organs: Secondary | ICD-10-CM

## 2024-05-22 ENCOUNTER — Other Ambulatory Visit: Payer: Self-pay

## 2024-05-22 ENCOUNTER — Telehealth: Payer: Self-pay

## 2024-05-22 DIAGNOSIS — J449 Chronic obstructive pulmonary disease, unspecified: Secondary | ICD-10-CM

## 2024-05-22 MED ORDER — MONTELUKAST SODIUM 10 MG PO TABS: 10.0000 mg | ORAL_TABLET | Freq: Every day | ORAL | 5 refills | Status: DC

## 2024-05-22 NOTE — Telephone Encounter (Signed)
 Copied from CRM [id number]. Topic: Clinical - Medication Refill >> May 22, 2024 12:51 PM Antwanette L wrote: Medication: montelukast  (SINGULAIR ) 10 MG tablet   Has the patient contacted their pharmacy? Yes   This is the patient's preferred pharmacy:  [facility] [address] [place] - [address] [place] [address] Phone: [phone] Fax: [phone]  Is this the correct pharmacy for this prescription? Yes   Has the prescription been filled recently? Yes. An order was sent on 05/10/24  but the pharmacy said they never received the order.  Is the patient out of the medication? Yes  Has the patient been seen for an appointment in the last year OR does the patient have an upcoming appointment? Yes. Last office visit with Damien Pringle FNP was on 05/10/24 and next appt is 11/07/24  Can we respond through MyChart? No. Patient can be reached at [phone]  Agent: Please be advised that Rx refills may take up to 3 business days. We ask that you follow-up with your pharmacy.

## 2024-05-22 NOTE — Telephone Encounter (Unsigned)
 Copied from CRM [id number]. Topic: Clinical - Refused Triage >> May 22, 2024 12:58 PM Antwanette L wrote: Pt reports runny/stuffy nose, post nasal drainage, itchiness, and swelling on the outside of her throat. She is requesting a refill on montelukast  (SINGULAIR ) 10 MG tablet. Refill requesting was submitted today. I asked the patient if she needed to speak with a nurse regarding her symptoms, and she declined.

## 2024-05-22 NOTE — Telephone Encounter (Signed)
 Called patient and she spoke with PCP

## 2024-05-23 ENCOUNTER — Other Ambulatory Visit: Payer: Self-pay

## 2024-05-23 DIAGNOSIS — J449 Chronic obstructive pulmonary disease, unspecified: Secondary | ICD-10-CM

## 2024-05-23 MED ORDER — MONTELUKAST SODIUM 10 MG PO TABS: 10.0000 mg | ORAL_TABLET | Freq: Every day | ORAL | 5 refills | Status: AC

## 2024-05-27 NOTE — H&P (Signed)
 Surgical History & Physical  Patient Name: Lori Lopez  DOB: May 14, 1950  Surgery: Cataract extraction with intraocular lens implant phacoemulsification; Left Eye Surgeon: Marsa Cleverly MD Surgery Date: 05/31/2024 Pre-Op Date: 05/14/2024  HPI: A 30 Yr. old female patient 1. The patient is returning after cataract surgery. The right eye is affected. Status post cataract surgery, which began 1 week ago: Since the last visit, the affected area decreased. The patient's vision is blurry. The condition's severity is constant. Patient is following medication instructions. Paitent states she lines in her vision since having the cat sx. 2. The patient is returning for a cataract follow-up of the left eye. Since the last visit, the affected area is tolerating. The patient's vision is blurry. Patient is not taking medications. This is negatively affecting the patient's quality of life and the patient is unable to function adequately in life with the current level of vision. HPI was performed by Marsa Cleverly .  Medical History: Dry Eyes Cataracts  Arthritis Lung Problems  Review of Systems Musculoskeletal ARTHRITIS Respiratory COPD All recorded systems are negative except as noted above.  Social Current some day smoker   Medication Prednisolone-moxiflox-bromfen,  Cilostazol , Cyclosporine, Levocetirizine, Fluticasone  propionate  Sx/Procedures Phaco c IOL OD-dex,  Back Surgery, Neck surgery  Drug Allergies  NKDA  History & Physical: Heent: cataract NECK: supple without bruits LUNGS: lungs clear to auscultation CV: regular rate and rhythm Abdomen: soft and non-tender  Impression & Plan: Assessment: 1.  CATARACT NUCLEAR SCLEROSIS AGE RELATED; Both Eyes (H25.13) 2.  CATARACT EXTRACTION STATUS; Right Eye (Z98.41) 3.  INTRAOCULAR LENS IOL (Z96.1)  Plan: 1.  Cataracts are visually significant and account for the patient's complaints. Discussed all risks, benefits, procedures and  recovery, including infection, loss of vision and eye, need for glasses after surgery or additional procedures. Patient understands changing glasses will not improve vision. Patient indicated understanding of procedure. All questions answered. Patient desires to have surgery, recommend phacoemulsification with intraocular lens. Patient to have preliminary testing necessary (Argos/IOL Master, Mac OCT, TOPO) Educational materials provided:Cataract.  Plan: - Proceed with cataract surgery OS when ready - Plan for best distance target with DIB00 - No DM, no fuchs, no prior eye surgery - good dilation - no trauma, no CL use - Dextenza  if available  2.  CE/PCIOL OD 05/07/24  POD7 Doing well. All post-op precautions discussed and instructions reviewed. Written instructions given.  Myopic outcome today OD, monitor for now  3.  See above

## 2024-05-29 ENCOUNTER — Encounter (HOSPITAL_COMMUNITY)
Admission: RE | Admit: 2024-05-29 | Discharge: 2024-05-29 | Disposition: A | Discharge status: Home / Self Care | Source: Ambulatory Visit | Attending: Optometry | Admitting: Optometry

## 2024-05-29 ENCOUNTER — Encounter (HOSPITAL_COMMUNITY): Payer: Self-pay

## 2024-05-31 ENCOUNTER — Ambulatory Visit (HOSPITAL_COMMUNITY): Admitting: Certified Registered"

## 2024-05-31 ENCOUNTER — Ambulatory Visit (HOSPITAL_COMMUNITY)
Admission: RE | Admit: 2024-05-31 | Discharge: 2024-05-31 | Disposition: A | Discharge status: Home / Self Care | Attending: Optometry | Admitting: Optometry

## 2024-05-31 ENCOUNTER — Encounter (HOSPITAL_COMMUNITY)
Admission: RE | Disposition: A | Discharge status: Home / Self Care | Payer: Self-pay | Source: Home / Self Care | Attending: Optometry

## 2024-05-31 ENCOUNTER — Encounter (HOSPITAL_COMMUNITY): Payer: Self-pay | Admitting: Optometry

## 2024-05-31 DIAGNOSIS — Z9841 Cataract extraction status, right eye: Secondary | ICD-10-CM | POA: Diagnosis not present

## 2024-05-31 DIAGNOSIS — Z961 Presence of intraocular lens: Secondary | ICD-10-CM | POA: Diagnosis not present

## 2024-05-31 DIAGNOSIS — F17200 Nicotine dependence, unspecified, uncomplicated: Secondary | ICD-10-CM | POA: Insufficient documentation

## 2024-05-31 DIAGNOSIS — H5712 Ocular pain, left eye: Secondary | ICD-10-CM | POA: Diagnosis not present

## 2024-05-31 DIAGNOSIS — J449 Chronic obstructive pulmonary disease, unspecified: Secondary | ICD-10-CM | POA: Diagnosis not present

## 2024-05-31 DIAGNOSIS — H2512 Age-related nuclear cataract, left eye: Secondary | ICD-10-CM | POA: Diagnosis present

## 2024-05-31 MED ORDER — POVIDONE-IODINE 5 % OP SOLN
OPHTHALMIC | Status: DC | PRN
  Administered 2024-05-31: 1 via OPHTHALMIC

## 2024-05-31 MED ORDER — MIDAZOLAM HCL 2 MG/2ML IJ SOLN
INTRAMUSCULAR | Status: AC
  Filled 2024-05-31: qty 2

## 2024-05-31 MED ORDER — SIGHTPATH DOSE#1 NA HYALUR & NA CHOND-NA HYALUR IO KIT
PACK | INTRAOCULAR | Status: DC | PRN
  Administered 2024-05-31: 1 via OPHTHALMIC

## 2024-05-31 MED ORDER — PHENYLEPHRINE HCL 2.5 % OP SOLN
1.0000 [drp] | OPHTHALMIC | Status: AC | PRN
  Administered 2024-05-31 (×3): 1 [drp] via OPHTHALMIC

## 2024-05-31 MED ORDER — PHENYLEPHRINE-KETOROLAC 1-0.3 % IO SOLN
INTRAOCULAR | Status: DC | PRN
  Administered 2024-05-31: 500 mL via OPHTHALMIC

## 2024-05-31 MED ORDER — MOXIFLOXACIN HCL 5 MG/ML IO SOLN
INTRAOCULAR | Status: DC | PRN
  Administered 2024-05-31: .2 mL via INTRACAMERAL

## 2024-05-31 MED ORDER — TETRACAINE HCL 0.5 % OP SOLN
1.0000 [drp] | OPHTHALMIC | Status: AC | PRN
  Administered 2024-05-31 (×3): 1 [drp] via OPHTHALMIC

## 2024-05-31 MED ORDER — LIDOCAINE HCL (PF) 1 % IJ SOLN
INTRAMUSCULAR | Status: DC | PRN
  Administered 2024-05-31: 1 mL

## 2024-05-31 MED ORDER — STERILE WATER FOR IRRIGATION IR SOLN
Status: DC | PRN
  Administered 2024-05-31: 1

## 2024-05-31 MED ORDER — IPRATROPIUM-ALBUTEROL 0.5-2.5 (3) MG/3ML IN SOLN
3.0000 mL | Freq: Once | RESPIRATORY_TRACT | Status: AC
  Administered 2024-05-31: 3 mL via RESPIRATORY_TRACT

## 2024-05-31 MED ORDER — BSS IO SOLN
INTRAOCULAR | Status: DC | PRN
  Administered 2024-05-31: 15 mL via INTRAOCULAR

## 2024-05-31 MED ORDER — DEXAMETHASONE 0.4 MG OP INST
VAGINAL_INSERT | OPHTHALMIC | Status: DC | PRN
  Administered 2024-05-31: .4 mg via OPHTHALMIC

## 2024-05-31 MED ORDER — MIDAZOLAM HCL 5 MG/5ML IJ SOLN
INTRAMUSCULAR | Status: DC | PRN
  Administered 2024-05-31: 2 mg via INTRAVENOUS

## 2024-05-31 MED ORDER — DEXAMETHASONE 0.4 MG OP INST
VAGINAL_INSERT | OPHTHALMIC | Status: AC
  Filled 2024-05-31: qty 1

## 2024-05-31 MED ORDER — IPRATROPIUM-ALBUTEROL 0.5-2.5 (3) MG/3ML IN SOLN
RESPIRATORY_TRACT | Status: AC
  Filled 2024-05-31: qty 3

## 2024-05-31 MED ORDER — LIDOCAINE HCL 3.5 % OP GEL
1.0000 | Freq: Once | OPHTHALMIC | Status: AC
  Administered 2024-05-31: 1 via OPHTHALMIC

## 2024-05-31 MED ORDER — LACTATED RINGERS IV SOLN: INTRAVENOUS | Status: DC

## 2024-05-31 MED ORDER — TROPICAMIDE 1 % OP SOLN
1.0000 [drp] | OPHTHALMIC | Status: AC | PRN
  Administered 2024-05-31 (×3): 1 [drp] via OPHTHALMIC

## 2024-05-31 NOTE — Interval H&P Note (Signed)
 History and Physical Interval Note:  05/31/2024 11:39 AM  Lori Lopez  has presented today for surgery, with the diagnosis of nuclear sclerotic cataract, left eye.  The various methods of treatment have been discussed with the patient and family. After consideration of risks, benefits and other options for treatment, the patient has consented to  Procedures with comments: PHACOEMULSIFICATION, CATARACT, WITH IOL INSERTION (Left) - CDE: INSERTION, STENT, DRUG-ELUTING, LACRIMAL CANALICULUS (Left) as a surgical intervention.  The patient's history has been reviewed, patient examined, no change in status, stable for surgery.  I have reviewed the patient's chart and labs.  Questions were answered to the patient's satisfaction.     The H and P was reviewed and updated. The patient was examined.  No changes were found after exam.  The surgical eye was marked.   Lori Lopez

## 2024-05-31 NOTE — Transfer of Care (Signed)
 Immediate Anesthesia Transfer of Care Note  Patient: Lori Lopez  Procedure(s) Performed: PHACOEMULSIFICATION, CATARACT, WITH IOL INSERTION (Left: Eye) INSERTION, STENT, DRUG-ELUTING, LACRIMAL CANALICULUS (Left: Eye)  Patient Location: PACU  Anesthesia Type:MAC  Level of Consciousness: awake and alert   Airway & Oxygen  Therapy: Patient Spontanous Breathing and Patient connected to nasal cannula oxygen   Post-op Assessment: Report given to RN and Post -op Vital signs reviewed and stable  Post vital signs: Reviewed and stable  Last Vitals:  Vitals Value Taken Time  BP 138/70   Temp    Pulse 70   Resp    SpO2 99     Last Pain:  Vitals:   05/31/24 1148  TempSrc: Oral  PainSc: 0-No pain      Patients Stated Pain Goal: 6 (05/31/24 1148)  Complications: No notable events documented.

## 2024-05-31 NOTE — Discharge Instructions (Signed)
 Please discharge patient when stable, will follow up today with Dr. Ilsa Iha at the [facility] office immediately following discharge.  Leave shield in place until visit.  All paperwork with discharge instructions will be given at the office.  [facility] Address:  [address]  [place], [place] [address]  Dr. Chaya Jan Phone: [phone]

## 2024-05-31 NOTE — Anesthesia Preprocedure Evaluation (Addendum)
"                                    Anesthesia Evaluation  Patient identified by MRN, date of birth, ID band Patient awake    Reviewed: Allergy & Precautions, H&P , NPO status , Patient's Chart, lab work & pertinent test results  Airway Mallampati: II  TM Distance: >3 FB Neck ROM: Full    Dental no notable dental hx.    Pulmonary COPD, Current Smoker    + decreased breath sounds+ wheezing      Cardiovascular negative cardio ROS Normal cardiovascular exam Rhythm:Regular Rate:Normal     Neuro/Psych  PSYCHIATRIC DISORDERS Anxiety     negative neurological ROS     GI/Hepatic Neg liver ROS,GERD  ,,  Endo/Other  Hypothyroidism    Renal/GU Renal disease  negative genitourinary   Musculoskeletal negative musculoskeletal ROS (+)    Abdominal   Peds negative pediatric ROS (+)  Hematology negative hematology ROS (+)   Anesthesia Other Findings   Reproductive/Obstetrics negative OB ROS                              Anesthesia Physical Anesthesia Plan  ASA: 3  Anesthesia Plan: MAC   Post-op Pain Management:    Induction:   PONV Risk Score and Plan:   Airway Management Planned: Nasal Cannula  Additional Equipment:   Intra-op Plan:   Post-operative Plan:   Informed Consent: I have reviewed the patients History and Physical, chart, labs and discussed the procedure including the risks, benefits and alternatives for the proposed anesthesia with the patient or authorized representative who has indicated his/her understanding and acceptance.     Dental advisory given  Plan Discussed with: CRNA  Anesthesia Plan Comments:          Anesthesia Quick Evaluation  "

## 2024-05-31 NOTE — Op Note (Signed)
 Date of procedure: 05/31/24  Pre-operative diagnosis: Visually significant age-related nuclear cataract, Left Eye (H25.12)  Post-operative diagnosis: Visually significant age-related nuclear cataract, Left Eye H25.12; Ocular Pain and Inflammation, Left eye H57.12  Procedure: Removal of cataract via phacoemulsification and insertion of intra-ocular lens J&J DIB00 +24.0D into the capsular bag of the Left Eye, Dextenza  Implantation into left lower punctum CPT [phone]  Attending surgeon: Marsa JINNY Cleverly, MD  Anesthesia: MAC, Topical Akten   Complications: None  Estimated Blood Loss: <31mL (minimal)  Specimens: None  Implants:  Implant Name Type Inv. Item Serial No. Manufacturer Lot No. LRB No. Used Action  LENS IOL TECNIS EYHANCE 24.0 - [id number] Intraocular Lens LENS IOL TECNIS EYHANCE 24.0 [id number] SIGHTPATH  Left 1 Implanted    Indications:  Visually significant age-related cataract, Left Eye  Procedure:  The patient was seen and identified in the pre-operative area. The operative eye was identified and dilated.  The operative eye was marked.  Topical anesthesia was administered to the operative eye.     The patient was then to the operative suite and placed in the supine position.  A timeout was performed confirming the patient, procedure to be performed, and all other relevant information.   The patient's face was prepped and draped in the usual fashion for intra-ocular surgery.  A lid speculum was placed into the operative eye and the surgical microscope moved into place and focused.  An inferotemporal paracentesis was created using a 20 gauge paracentesis blade.  BSS mixed with Omidria , followed by 1% lidocaine  was injected into the anterior chamber.  Viscoelastic was injected into the anterior chamber.  A temporal clear-corneal main wound incision was created using a 2.29mm microkeratome.  A continuous curvilinear capsulorrhexis was initiated using an irrigating cystitome and  completed using capsulorrhexis forceps.  Hydrodissection and hydrodeliniation were performed.  Viscoelastic was injected into the anterior chamber.  A phacoemulsification handpiece and a chopper as a second instrument were used to remove the nucleus and epinucleus. The irrigation/aspiration handpiece was used to remove any remaining cortical material.   The capsular bag was reinflated with viscoelastic, checked, and found to be intact.  The intraocular lens was inserted into the capsular bag.  The irrigation/aspiration handpiece was used to remove any remaining viscoelastic.  The clear corneal wound and paracentesis wounds were then hydrated and checked with Weck-Cels to be watertight. Moxifloxacin  was instilled into the anterior chamber.  The lid-speculum and drape were removed. The lower punctum was dilated, and the dextenza  implant was inserted into it. The patient's face was cleaned with a wet and dry 4x4.  A clear shield was taped over the eye. The patient was taken to the post-operative care unit in good condition, having tolerated the procedure well.  Post-Op Instructions: The patient will follow up at [facility] for a same day post-operative evaluation and will receive all other orders and instructions.

## 2024-05-31 NOTE — Anesthesia Postprocedure Evaluation (Signed)
"   Anesthesia Post Note  Patient: SHAUNETTE GASSNER  Procedure(s) Performed: PHACOEMULSIFICATION, CATARACT, WITH IOL INSERTION (Left: Eye) INSERTION, STENT, DRUG-ELUTING, LACRIMAL CANALICULUS (Left: Eye)  Patient location during evaluation: PACU Anesthesia Type: MAC Level of consciousness: awake and alert Pain management: pain level controlled Vital Signs Assessment: post-procedure vital signs reviewed and stable Respiratory status: spontaneous breathing, nonlabored ventilation, respiratory function stable and patient connected to nasal cannula oxygen  Cardiovascular status: stable and blood pressure returned to baseline Postop Assessment: no apparent nausea or vomiting Anesthetic complications: no   No notable events documented.   Last Vitals:  Vitals:   05/31/24 1200 05/31/24 1242  BP: (!) 147/52 (!) 137/59  Pulse: 70 70  Resp: 16 16  Temp:  36.6 C  SpO2: 96% 97%    Last Pain:  Vitals:   05/31/24 1242  TempSrc: Oral  PainSc: 2                  Andrea Limes      "

## 2024-06-01 ENCOUNTER — Encounter (HOSPITAL_COMMUNITY): Payer: Self-pay | Admitting: Optometry

## 2024-06-06 ENCOUNTER — Ambulatory Visit
Admission: EM | Admit: 2024-06-06 | Discharge: 2024-06-06 | Disposition: A | Discharge status: Home / Self Care | Attending: Family Medicine | Admitting: Family Medicine

## 2024-06-06 ENCOUNTER — Encounter: Payer: Self-pay | Admitting: Emergency Medicine

## 2024-06-06 ENCOUNTER — Telehealth: Admitting: Nurse Practitioner

## 2024-06-06 DIAGNOSIS — R300 Dysuria: Secondary | ICD-10-CM

## 2024-06-06 DIAGNOSIS — N390 Urinary tract infection, site not specified: Secondary | ICD-10-CM | POA: Diagnosis present

## 2024-06-06 DIAGNOSIS — N898 Other specified noninflammatory disorders of vagina: Secondary | ICD-10-CM

## 2024-06-06 LAB — POCT URINE DIPSTICK
Bilirubin, UA: NEGATIVE
Glucose, UA: NEGATIVE mg/dL
Ketones, POC UA: NEGATIVE mg/dL
Nitrite, UA: NEGATIVE
POC PROTEIN,UA: 30 — AB
Spec Grav, UA: 1.02
Urobilinogen, UA: 0.2 U/dL
pH, UA: 7

## 2024-06-06 MED ORDER — PHENAZOPYRIDINE HCL 200 MG PO TABS: 200.0000 mg | ORAL_TABLET | Freq: Three times a day (TID) | ORAL | 0 refills | Status: AC | PRN

## 2024-06-06 MED ORDER — CEPHALEXIN 500 MG PO CAPS: 500.0000 mg | ORAL_CAPSULE | Freq: Two times a day (BID) | ORAL | 0 refills | Status: AC

## 2024-06-06 NOTE — ED Provider Notes (Signed)
 " [facility]    CSN: [id number] Arrival date & time: 06/06/24  1502      History   Chief Complaint No chief complaint on file.   HPI Lori Lopez is a 74 y.o. female.   Patient presenting today with new onset dysuria, urinary frequency and urgency that started late last night.  Denies fever, chills, abdominal pain, nausea, vomiting, hematuria, bowel changes.  So far not trying anything over-the-counter for symptoms.    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   Degeneration, intervertebral disc, cervicothoracic 06/13/2013   Hypothyroidism    Mixed hyperlipidemia 05/15/2013   Obesity due to excess calories 05/15/2013   Tinea unguium 05/15/2013   Vitamin D  deficiency 12/28/2009    Patient Active Problem List   Diagnosis Date Noted   Claudication in peripheral vascular disease 09/12/2023   Numbness and tingling in right hand 01/26/2023   Hypokalemia 04/24/2022   AKI (acute kidney injury) 04/24/2022   Chronic respiratory failure with hypoxia and hypercapnia (HCC) 02/02/2022   Acute exacerbation of chronic obstructive pulmonary disease (COPD) (HCC) 01/18/2022   Hyperglycemia 01/18/2022   Leukocytosis 01/18/2022   Tobacco abuse 01/18/2022   SIRS (systemic inflammatory response syndrome) (HCC) 01/18/2022   Hemorrhoids 06/30/2021   Abnormal CT scan, colon 07/28/2020   RUQ pain 07/08/2020   Abdominal hernia 07/08/2020   RUQ abdominal mass 03/17/2020   Abdominal pain 03/17/2020   Acute respiratory failure with hypoxia (HCC) 06/02/2018   Influenza A 06/02/2018   COPD with acute exacerbation (HCC) 06/02/2018   Hypothyroidism 06/02/2018   Sinus tachycardia 06/02/2018   GAD (generalized  anxiety disorder) 06/02/2018   Elevated blood pressure reading 06/02/2018   Dysphagia 10/19/2017   GERD (gastroesophageal reflux disease) 04/12/2017   Constipation 03/10/2016   Rectal bleeding 03/10/2016   SUI (stress urinary incontinence, female) 10/13/2014   COPD GOLD 3 still smoking 11/05/2010   Cigarette smoker 05/25/2009    Past Surgical History:  Procedure Laterality Date   BACK SURGERY     BIOPSY  11/07/2017   Procedure: BIOPSY;  Surgeon: Harvey Margo CROME, MD;  [facility]: [facility];  Service: Endoscopy;;  gastric biopsy    CATARACT EXTRACTION W/PHACO Right 05/07/2024   Procedure: PHACOEMULSIFICATION, CATARACT, WITH IOL INSERTION;  Surgeon: Juli Blunt, MD;  Location: [facility];  Service: Ophthalmology;  Laterality: Right;  CDE:12.14   CATARACT EXTRACTION W/PHACO Left 05/31/2024   Procedure: PHACOEMULSIFICATION, CATARACT, WITH IOL INSERTION;  Surgeon: Juli Blunt, MD;  Location: [facility];  Service: Ophthalmology;  Laterality: Left;  CDE: 2.11   CERVICAL DISC SURGERY     COLONOSCOPY  2008   Dr. Harvey: normal colon, small internal hemorrhoids.    COLONOSCOPY N/A 04/08/2016   Dr. Harvey: examined portion of ileum normal. two 5-6 mm polyps in descending colon and transverse colon, three 2-4 mm polyps in rectum, (all hyperplastic), internal and external hemorrhoids. Repeat in 10 years   COLONOSCOPY N/A 07/31/2023   Procedure: COLONOSCOPY;  Surgeon: Cindie Carlin POUR, DO;  [facility]: [facility];  Service: Endoscopy;  Laterality: N/A;   COLONOSCOPY WITH PROPOFOL  N/A 09/22/2020   Procedure: COLONOSCOPY WITH PROPOFOL ;  Surgeon: Cindie Carlin POUR, DO;  [facility]: [facility];  Service: Endoscopy;  Laterality: N/A;  2:45pm  ESOPHAGOGASTRODUODENOSCOPY (EGD) WITH PROPOFOL  N/A 11/07/2017   Dr. Harvey: Normal esophagus status post dilation.  Mild gastritis with benign biopsies   INSERTION, STENT, DRUG-ELUTING, LACRIMAL CANALICULUS Right 05/07/2024   Procedure: INSERTION, STENT,  DRUG-ELUTING, LACRIMAL CANALICULUS;  Surgeon: Juli Blunt, MD;  Location: [facility];  Service: Ophthalmology;  Laterality: Right;   INSERTION, STENT, DRUG-ELUTING, LACRIMAL CANALICULUS Left 05/31/2024   Procedure: INSERTION, STENT, DRUG-ELUTING, LACRIMAL CANALICULUS;  Surgeon: Juli Blunt, MD;  Location: [facility];  Service: Ophthalmology;  Laterality: Left;   POLYPECTOMY  09/22/2020   Procedure: POLYPECTOMY;  Surgeon: Cindie Carlin POUR, DO;  [facility];  Service: Endoscopy;;   POLYPECTOMY  07/31/2023   Procedure: POLYPECTOMY;  Surgeon: Cindie Carlin POUR, DO;  [facility];  Service: Endoscopy;;   SAVORY DILATION N/A 11/07/2017   Procedure: SAVORY DILATION;  Surgeon: Harvey Margo CROME, MD;  [facility];  Service: Endoscopy;  Laterality: N/A;    OB History     Gravida  3   Para  3   Term  3   Preterm      AB      Living  2      SAB      IAB      Ectopic      Multiple      Live Births               Home Medications    Prior to Admission medications  Medication Sig Start Date End Date Taking? Authorizing Provider  cephALEXin  (KEFLEX ) 500 MG capsule Take 1 capsule (500 mg total) by mouth 2 (two) times daily. 06/06/24  Yes Stuart Vernell Norris, PA-C  phenazopyridine  (PYRIDIUM ) 200 MG tablet Take 1 tablet (200 mg total) by mouth 3 (three) times daily as needed for pain. 06/06/24  Yes Stuart Vernell Norris, PA-C  albuterol  (VENTOLIN  HFA) 108 (90 Base) MCG/ACT inhaler 2 puffs every 4 (four) hours as needed for shortness of breath or wheezing. 09/21/21   [provider]  ALPRAZolam  (XANAX ) 1 MG tablet Take 0.5 mg by mouth daily as needed for anxiety. 02/10/16   [provider]  aspirin  EC 81 MG tablet Take 81 mg by mouth daily. Swallow whole.    [provider]  B Complex-Biotin-FA (B-COMPLEX HIGH STRENGTH 10 PO) Take by mouth.    [provider]  Budeson-Glycopyrrol-Formoterol (BREZTRI  AEROSPHERE IN)  Inhale 2 puffs into the lungs in the morning and at bedtime.    [provider]  Cholecalciferol  (VITAMIN D3) 1000 units CAPS Take 1,000 Units by mouth daily.    [provider]  cilostazol  (PLETAL ) 100 MG tablet Take 1 tablet (100 mg total) by mouth 2 (two) times daily before a meal. 09/12/23   Gretta Lonni PARAS, MD  fluticasone  [facility]) 50 MCG/ACT nasal spray Place 1 spray into both nostrils daily.    [provider]  gabapentin  (NEURONTIN ) 300 MG capsule Take 1 capsule (300 mg total) by mouth 3 (three) times daily. 03/21/24   Darlean Ozell NOVAK, MD  ipratropium-albuterol  (DUONEB) 0.5-2.5 (3) MG/3ML SOLN Take 3 mLs by nebulization every 4 (four) hours as needed. 01/19/22   Johnson, Clanford L, MD  levocetirizine (XYZAL) 5 MG tablet Take 5 mg by mouth at bedtime. 05/05/22   [provider]  magnesium 30 MG tablet Take 30 mg by mouth 2 (two) times daily.    [provider]  montelukast  (SINGULAIR ) 10 MG tablet Take 1 tablet (10 mg total) by mouth at  bedtime. 05/23/24   Gerard Damien NOVAK, FNP  pantoprazole  (PROTONIX ) 40 MG tablet Take 40 mg by mouth daily.    [provider]    Family History Family History  Problem Relation Age of Onset   Diabetes Mother    CAD Mother    Heart disease Mother    CAD Father    Colon cancer Neg Hx    Colon polyps Neg Hx     Social History Social History[1]   Allergies   Shellfish protein-containing drug products, Shrimp [shellfish allergy], Hydrocodone, and Lyrica [pregabalin]   Review of Systems Review of Systems Per HPI  Physical Exam Triage Vital Signs ED Triage Vitals  Encounter Vitals Group     BP 06/06/24 1518 126/62     Girls Systolic BP Percentile --      Girls Diastolic BP Percentile --      Boys Systolic BP Percentile --      Boys Diastolic BP Percentile --      Pulse Rate 06/06/24 1518 84     Resp 06/06/24 1518 18     Temp 06/06/24 1518 97.7 F (36.5 C)     Temp Source 06/06/24  1518 Oral     SpO2 06/06/24 1518 94 %     Weight --      Height --      Head Circumference --      Peak Flow --      Pain Score 06/06/24 1519 0     Pain Loc --      Pain Education --      Exclude from Growth Chart --    No data found.  Updated Vital Signs BP 126/62 (BP Location: Right Arm)   Pulse 84   Temp 97.7 F (36.5 C) (Oral)   Resp 18   SpO2 94%   Visual Acuity Right Eye Distance:   Left Eye Distance:   Bilateral Distance:    Right Eye Near:   Left Eye Near:    Bilateral Near:     Physical Exam Vitals and nursing note reviewed.  Constitutional:      Appearance: Normal appearance. She is not ill-appearing.  HENT:     Head: Atraumatic.  Eyes:     Extraocular Movements: Extraocular movements intact.     Conjunctiva/sclera: Conjunctivae normal.  Cardiovascular:     Rate and Rhythm: Normal rate.  Pulmonary:     Effort: Pulmonary effort is normal.  Abdominal:     General: Bowel sounds are normal. There is no distension.     Palpations: Abdomen is soft.     Tenderness: There is no abdominal tenderness. There is no right CVA tenderness, left CVA tenderness or guarding.  Musculoskeletal:        General: Normal range of motion.     Cervical back: Normal range of motion and neck supple.  Skin:    General: Skin is warm and dry.  Neurological:     Mental Status: She is alert and oriented to person, place, and time.  Psychiatric:        Mood and Affect: Mood normal.        Thought Content: Thought content normal.        Judgment: Judgment normal.      UC Treatments / Results  Labs (all labs ordered are listed, but only abnormal results are displayed) Labs Reviewed  POCT URINE DIPSTICK - Abnormal; Notable for the following components:      Result Value  Clarity, UA cloudy (*)    Blood, UA small (*)    POC PROTEIN,UA =30 (*)    Leukocytes, UA Large (3+) (*)    All other components within normal limits  URINE CULTURE    EKG   Radiology No results  found.  Procedures Procedures (including critical care time)  Medications Ordered in UC Medications - No data to display  Initial Impression / Assessment and Plan / UC Course  I have reviewed the triage vital signs and the nursing notes.  Pertinent labs & imaging results that were available during my care of the patient were reviewed by me and considered in my medical decision making (see chart for details).     Vital signs and exam reassuring today, urinalysis with evidence of urinary tract infection.  Urine culture pending, will treat with Keflex , Prodium, fluids and adjust if needed based on culture.  Final Clinical Impressions(s) / UC Diagnoses   Final diagnoses:  Acute lower UTI   Discharge Instructions   None    ED Prescriptions     Medication Sig Dispense Auth. Provider   cephALEXin  (KEFLEX ) 500 MG capsule Take 1 capsule (500 mg total) by mouth 2 (two) times daily. 10 capsule Stuart Vernell Norris, PA-C   phenazopyridine  (PYRIDIUM ) 200 MG tablet Take 1 tablet (200 mg total) by mouth 3 (three) times daily as needed for pain. 6 tablet Stuart Vernell Norris, [place]      PDMP not reviewed this encounter.    [1]  Social History Tobacco Use   Smoking status: Every Day    Current packs/day: 1.00    Average packs/day: 1 pack/day for 60.1 years (60.1 ttl pk-yrs)    Types: Cigarettes    Start date: 1966   Smokeless tobacco: Never   Tobacco comments:    vapes  Vaping Use   Vaping status: Every Day   Substances: Flavoring  Substance Use Topics   Alcohol use: No    Alcohol/week: 0.0 standard drinks of alcohol   Drug use: No     Stuart Vernell Norris, PA-C 06/06/24 1629  "

## 2024-06-06 NOTE — Progress Notes (Signed)
 Because of dysuria and discharge with advanced age cause these symptoms to be more complex and can rapidly deteriorate/worsen, I feel your condition warrants further evaluation and I recommend that you be seen for a face to face visit to have a urine culture obtained. This will allow that the appropriate antibiotic is used to prevent these symptoms from worsening and causing more severe issues like sepsis.  Please contact your primary care physician practice to be seen. Many offices offer virtual options to be seen via video if you are not comfortable going in person to a medical facility at this time.  NOTE: You will NOT be charged for this eVisit.  If you do not have a PCP, [facility] offers a free physician referral service available at [phone]. Our trained staff has the experience, knowledge and resources to put you in touch with a physician who is right for you.   If you are having a true medical emergency, please call 911.     For an urgent face to face visit, [place] has multiple urgent care centers for your convenience.  Click the link below for the full list of locations and hours, walk-in wait times, appointment scheduling options and driving directions:  Urgent Care - [place], [place], [place], [place], [place], [place]  [place]     Your MyChart E-visit questionnaire answers were reviewed by a board certified advanced clinical practitioner to complete your personal care plan based on your specific symptoms.    Thank you for using e-Visits.

## 2024-06-06 NOTE — ED Triage Notes (Signed)
 Pain on urination that started this morning with urinary frequency.

## 2024-06-08 LAB — URINE CULTURE

## 2024-06-12 ENCOUNTER — Ambulatory Visit (HOSPITAL_COMMUNITY): Payer: Self-pay

## 2024-11-07 ENCOUNTER — Ambulatory Visit
